# Patient Record
Sex: Female | Born: 1945 | Race: White | Hispanic: No | Marital: Married | Smoking: Never smoker
Health system: Southern US, Community
[De-identification: ages and names within clinical notes are randomized; demographics above are authoritative.]

## PROBLEM LIST (undated history)

## (undated) DIAGNOSIS — M8580 Other specified disorders of bone density and structure, unspecified site: Secondary | ICD-10-CM

## (undated) DIAGNOSIS — G2581 Restless legs syndrome: Secondary | ICD-10-CM

## (undated) DIAGNOSIS — K5730 Diverticulosis of large intestine without perforation or abscess without bleeding: Secondary | ICD-10-CM

## (undated) DIAGNOSIS — I10 Essential (primary) hypertension: Secondary | ICD-10-CM

## (undated) DIAGNOSIS — H269 Unspecified cataract: Secondary | ICD-10-CM

## (undated) DIAGNOSIS — M545 Low back pain, unspecified: Secondary | ICD-10-CM

## (undated) DIAGNOSIS — M1990 Unspecified osteoarthritis, unspecified site: Secondary | ICD-10-CM

## (undated) DIAGNOSIS — C50919 Malignant neoplasm of unspecified site of unspecified female breast: Secondary | ICD-10-CM

## (undated) DIAGNOSIS — G709 Myoneural disorder, unspecified: Secondary | ICD-10-CM

## (undated) DIAGNOSIS — K635 Polyp of colon: Secondary | ICD-10-CM

## (undated) DIAGNOSIS — K222 Esophageal obstruction: Secondary | ICD-10-CM

## (undated) DIAGNOSIS — Z923 Personal history of irradiation: Secondary | ICD-10-CM

## (undated) DIAGNOSIS — M79674 Pain in right toe(s): Secondary | ICD-10-CM

## (undated) DIAGNOSIS — Z87440 Personal history of urinary (tract) infections: Secondary | ICD-10-CM

## (undated) DIAGNOSIS — R200 Anesthesia of skin: Secondary | ICD-10-CM

## (undated) DIAGNOSIS — R3915 Urgency of urination: Secondary | ICD-10-CM

## (undated) DIAGNOSIS — T7840XA Allergy, unspecified, initial encounter: Secondary | ICD-10-CM

## (undated) DIAGNOSIS — K219 Gastro-esophageal reflux disease without esophagitis: Secondary | ICD-10-CM

## (undated) HISTORY — DX: Myoneural disorder, unspecified: G70.9

## (undated) HISTORY — DX: Restless legs syndrome: G25.81

## (undated) HISTORY — PX: BREAST SURGERY: SHX581

## (undated) HISTORY — DX: Other specified disorders of bone density and structure, unspecified site: M85.80

## (undated) HISTORY — DX: Low back pain, unspecified: M54.50

## (undated) HISTORY — DX: Unspecified cataract: H26.9

## (undated) HISTORY — DX: Pain in right toe(s): M79.674

## (undated) HISTORY — PX: EYE SURGERY: SHX253

## (undated) HISTORY — PX: POLYPECTOMY: SHX149

## (undated) HISTORY — PX: FRACTURE SURGERY: SHX138

## (undated) HISTORY — PX: COLONOSCOPY: SHX174

## (undated) HISTORY — DX: Polyp of colon: K63.5

## (undated) HISTORY — DX: Essential (primary) hypertension: I10

## (undated) HISTORY — DX: Diverticulosis of large intestine without perforation or abscess without bleeding: K57.30

## (undated) HISTORY — DX: Esophageal obstruction: K22.2

## (undated) HISTORY — DX: Malignant neoplasm of unspecified site of unspecified female breast: C50.919

## (undated) HISTORY — DX: Gastro-esophageal reflux disease without esophagitis: K21.9

## (undated) HISTORY — DX: Allergy, unspecified, initial encounter: T78.40XA

---

## 1997-08-14 ENCOUNTER — Other Ambulatory Visit: Admission: RE | Admit: 1997-08-14 | Discharge: 1997-08-14 | Payer: Self-pay | Admitting: Obstetrics & Gynecology

## 1998-08-15 ENCOUNTER — Other Ambulatory Visit: Admission: RE | Admit: 1998-08-15 | Discharge: 1998-08-15 | Payer: Self-pay | Admitting: Obstetrics & Gynecology

## 1999-04-08 ENCOUNTER — Other Ambulatory Visit: Admission: RE | Admit: 1999-04-08 | Discharge: 1999-04-08 | Payer: Self-pay | Admitting: Gastroenterology

## 1999-04-08 ENCOUNTER — Encounter (INDEPENDENT_AMBULATORY_CARE_PROVIDER_SITE_OTHER): Payer: Self-pay

## 1999-08-21 ENCOUNTER — Encounter: Payer: Self-pay | Admitting: Obstetrics & Gynecology

## 1999-08-21 ENCOUNTER — Encounter: Admission: RE | Admit: 1999-08-21 | Discharge: 1999-08-21 | Payer: Self-pay | Admitting: Obstetrics & Gynecology

## 1999-08-26 ENCOUNTER — Other Ambulatory Visit: Admission: RE | Admit: 1999-08-26 | Discharge: 1999-08-26 | Payer: Self-pay | Admitting: Obstetrics & Gynecology

## 2000-08-05 ENCOUNTER — Encounter: Payer: Self-pay | Admitting: Obstetrics & Gynecology

## 2000-08-05 ENCOUNTER — Encounter: Admission: RE | Admit: 2000-08-05 | Discharge: 2000-08-05 | Payer: Self-pay | Admitting: Obstetrics & Gynecology

## 2000-08-10 ENCOUNTER — Encounter: Payer: Self-pay | Admitting: Obstetrics & Gynecology

## 2000-08-10 ENCOUNTER — Encounter: Admission: RE | Admit: 2000-08-10 | Discharge: 2000-08-10 | Payer: Self-pay | Admitting: Obstetrics & Gynecology

## 2000-08-11 ENCOUNTER — Other Ambulatory Visit: Admission: RE | Admit: 2000-08-11 | Discharge: 2000-08-11 | Payer: Self-pay | Admitting: Obstetrics & Gynecology

## 2001-08-11 ENCOUNTER — Encounter: Admission: RE | Admit: 2001-08-11 | Discharge: 2001-08-11 | Payer: Self-pay | Admitting: Obstetrics & Gynecology

## 2001-08-11 ENCOUNTER — Encounter: Payer: Self-pay | Admitting: Obstetrics & Gynecology

## 2001-08-13 ENCOUNTER — Other Ambulatory Visit: Admission: RE | Admit: 2001-08-13 | Discharge: 2001-08-13 | Payer: Self-pay | Admitting: Obstetrics & Gynecology

## 2002-08-15 ENCOUNTER — Encounter: Payer: Self-pay | Admitting: Obstetrics & Gynecology

## 2002-08-15 ENCOUNTER — Encounter: Admission: RE | Admit: 2002-08-15 | Discharge: 2002-08-15 | Payer: Self-pay | Admitting: Obstetrics & Gynecology

## 2002-08-17 ENCOUNTER — Other Ambulatory Visit: Admission: RE | Admit: 2002-08-17 | Discharge: 2002-08-17 | Payer: Self-pay | Admitting: Obstetrics & Gynecology

## 2003-04-10 ENCOUNTER — Encounter: Payer: Self-pay | Admitting: Gastroenterology

## 2003-08-28 ENCOUNTER — Encounter: Admission: RE | Admit: 2003-08-28 | Discharge: 2003-08-28 | Payer: Self-pay | Admitting: Obstetrics & Gynecology

## 2003-09-05 ENCOUNTER — Other Ambulatory Visit: Admission: RE | Admit: 2003-09-05 | Discharge: 2003-09-05 | Payer: Self-pay | Admitting: Obstetrics & Gynecology

## 2004-05-05 DIAGNOSIS — C50919 Malignant neoplasm of unspecified site of unspecified female breast: Secondary | ICD-10-CM

## 2004-05-05 DIAGNOSIS — Z923 Personal history of irradiation: Secondary | ICD-10-CM

## 2004-05-05 HISTORY — DX: Malignant neoplasm of unspecified site of unspecified female breast: C50.919

## 2004-05-05 HISTORY — PX: BREAST LUMPECTOMY: SHX2

## 2004-05-05 HISTORY — DX: Personal history of irradiation: Z92.3

## 2004-09-12 ENCOUNTER — Encounter: Admission: RE | Admit: 2004-09-12 | Discharge: 2004-09-12 | Payer: Self-pay | Admitting: Obstetrics & Gynecology

## 2004-09-16 ENCOUNTER — Other Ambulatory Visit: Admission: RE | Admit: 2004-09-16 | Discharge: 2004-09-16 | Payer: Self-pay | Admitting: Obstetrics & Gynecology

## 2004-09-25 ENCOUNTER — Encounter (INDEPENDENT_AMBULATORY_CARE_PROVIDER_SITE_OTHER): Payer: Self-pay | Admitting: *Deleted

## 2004-09-25 ENCOUNTER — Encounter (INDEPENDENT_AMBULATORY_CARE_PROVIDER_SITE_OTHER): Payer: Self-pay | Admitting: Diagnostic Radiology

## 2004-09-25 ENCOUNTER — Encounter: Admission: RE | Admit: 2004-09-25 | Discharge: 2004-09-25 | Payer: Self-pay | Admitting: Obstetrics & Gynecology

## 2004-10-03 ENCOUNTER — Encounter: Admission: RE | Admit: 2004-10-03 | Discharge: 2004-10-03 | Payer: Self-pay | Admitting: Surgery

## 2004-10-08 ENCOUNTER — Encounter (INDEPENDENT_AMBULATORY_CARE_PROVIDER_SITE_OTHER): Payer: Self-pay | Admitting: *Deleted

## 2004-10-08 ENCOUNTER — Encounter: Admission: RE | Admit: 2004-10-08 | Discharge: 2004-10-08 | Payer: Self-pay | Admitting: Surgery

## 2004-10-08 ENCOUNTER — Ambulatory Visit (HOSPITAL_BASED_OUTPATIENT_CLINIC_OR_DEPARTMENT_OTHER): Admission: RE | Admit: 2004-10-08 | Discharge: 2004-10-08 | Payer: Self-pay | Admitting: Surgery

## 2004-10-08 ENCOUNTER — Ambulatory Visit (HOSPITAL_COMMUNITY): Admission: RE | Admit: 2004-10-08 | Discharge: 2004-10-08 | Payer: Self-pay | Admitting: Surgery

## 2004-10-09 ENCOUNTER — Ambulatory Visit: Payer: Self-pay | Admitting: Oncology

## 2004-10-28 ENCOUNTER — Ambulatory Visit: Admission: RE | Admit: 2004-10-28 | Discharge: 2004-11-18 | Payer: Self-pay | Admitting: *Deleted

## 2004-10-28 ENCOUNTER — Ambulatory Visit (HOSPITAL_COMMUNITY): Admission: RE | Admit: 2004-10-28 | Discharge: 2004-10-28 | Payer: Self-pay | Admitting: General Surgery

## 2004-11-20 ENCOUNTER — Ambulatory Visit: Admission: RE | Admit: 2004-11-20 | Discharge: 2004-11-20 | Payer: Self-pay | Admitting: *Deleted

## 2004-12-17 ENCOUNTER — Ambulatory Visit: Payer: Self-pay | Admitting: Oncology

## 2005-02-04 ENCOUNTER — Ambulatory Visit: Payer: Self-pay | Admitting: Oncology

## 2005-04-02 ENCOUNTER — Ambulatory Visit: Payer: Self-pay | Admitting: Oncology

## 2005-05-13 ENCOUNTER — Encounter: Admission: RE | Admit: 2005-05-13 | Discharge: 2005-05-13 | Payer: Self-pay | Admitting: Oncology

## 2005-05-20 ENCOUNTER — Ambulatory Visit: Payer: Self-pay | Admitting: Oncology

## 2005-07-08 ENCOUNTER — Ambulatory Visit: Payer: Self-pay | Admitting: Gastroenterology

## 2005-07-09 ENCOUNTER — Ambulatory Visit: Payer: Self-pay | Admitting: Gastroenterology

## 2005-08-12 ENCOUNTER — Ambulatory Visit: Payer: Self-pay | Admitting: Gastroenterology

## 2005-09-15 ENCOUNTER — Encounter: Admission: RE | Admit: 2005-09-15 | Discharge: 2005-09-15 | Payer: Self-pay | Admitting: Obstetrics & Gynecology

## 2005-11-25 ENCOUNTER — Ambulatory Visit: Payer: Self-pay | Admitting: Gastroenterology

## 2006-04-16 ENCOUNTER — Ambulatory Visit: Payer: Self-pay | Admitting: Oncology

## 2006-04-20 LAB — CBC WITH DIFFERENTIAL/PLATELET
BASO%: 1 % (ref 0.0–2.0)
EOS%: 3.7 % (ref 0.0–7.0)
HCT: 41.1 % (ref 34.8–46.6)
LYMPH%: 31.7 % (ref 14.0–48.0)
MCH: 32.7 pg (ref 26.0–34.0)
MCHC: 35.6 g/dL (ref 32.0–36.0)
NEUT%: 53.8 % (ref 39.6–76.8)
Platelets: 189 10*3/uL (ref 145–400)
lymph#: 1.9 10*3/uL (ref 0.9–3.3)

## 2006-04-20 LAB — COMPREHENSIVE METABOLIC PANEL
ALT: 21 U/L (ref 0–35)
AST: 19 U/L (ref 0–37)
Creatinine, Ser: 1.07 mg/dL (ref 0.40–1.20)
Total Bilirubin: 0.5 mg/dL (ref 0.3–1.2)

## 2006-09-17 ENCOUNTER — Encounter: Admission: RE | Admit: 2006-09-17 | Discharge: 2006-09-17 | Payer: Self-pay | Admitting: Obstetrics & Gynecology

## 2006-09-23 ENCOUNTER — Encounter: Admission: RE | Admit: 2006-09-23 | Discharge: 2006-09-23 | Payer: Self-pay | Admitting: Family Medicine

## 2007-04-15 ENCOUNTER — Ambulatory Visit: Payer: Self-pay | Admitting: Oncology

## 2007-04-19 LAB — COMPREHENSIVE METABOLIC PANEL
CO2: 25 mEq/L (ref 19–32)
Creatinine, Ser: 1.03 mg/dL (ref 0.40–1.20)
Glucose, Bld: 99 mg/dL (ref 70–99)
Sodium: 142 mEq/L (ref 135–145)
Total Bilirubin: 0.5 mg/dL (ref 0.3–1.2)
Total Protein: 7.1 g/dL (ref 6.0–8.3)

## 2007-04-19 LAB — CBC WITH DIFFERENTIAL/PLATELET
Basophils Absolute: 0 10*3/uL (ref 0.0–0.1)
Eosinophils Absolute: 0.2 10*3/uL (ref 0.0–0.5)
HCT: 41.7 % (ref 34.8–46.6)
HGB: 14.6 g/dL (ref 11.6–15.9)
LYMPH%: 33.7 % (ref 14.0–48.0)
MONO#: 0.7 10*3/uL (ref 0.1–0.9)
NEUT#: 3 10*3/uL (ref 1.5–6.5)
NEUT%: 51 % (ref 39.6–76.8)
Platelets: 191 10*3/uL (ref 145–400)
WBC: 5.8 10*3/uL (ref 3.9–10.0)

## 2007-04-19 LAB — CANCER ANTIGEN 27.29: CA 27.29: 25 U/mL (ref 0–39)

## 2007-09-28 ENCOUNTER — Encounter: Admission: RE | Admit: 2007-09-28 | Discharge: 2007-09-28 | Payer: Self-pay | Admitting: Obstetrics & Gynecology

## 2008-01-07 ENCOUNTER — Telehealth: Payer: Self-pay | Admitting: Gastroenterology

## 2008-03-13 ENCOUNTER — Telehealth: Payer: Self-pay | Admitting: Gastroenterology

## 2008-03-14 ENCOUNTER — Encounter: Admission: RE | Admit: 2008-03-14 | Discharge: 2008-03-14 | Payer: Self-pay | Admitting: Surgery

## 2008-03-15 ENCOUNTER — Ambulatory Visit (HOSPITAL_COMMUNITY): Admission: RE | Admit: 2008-03-15 | Discharge: 2008-03-15 | Payer: Self-pay | Admitting: Orthopedic Surgery

## 2008-04-10 DIAGNOSIS — F411 Generalized anxiety disorder: Secondary | ICD-10-CM | POA: Insufficient documentation

## 2008-04-10 DIAGNOSIS — C50919 Malignant neoplasm of unspecified site of unspecified female breast: Secondary | ICD-10-CM | POA: Insufficient documentation

## 2008-04-10 DIAGNOSIS — K222 Esophageal obstruction: Secondary | ICD-10-CM | POA: Insufficient documentation

## 2008-04-10 DIAGNOSIS — K219 Gastro-esophageal reflux disease without esophagitis: Secondary | ICD-10-CM | POA: Insufficient documentation

## 2008-04-10 DIAGNOSIS — M129 Arthropathy, unspecified: Secondary | ICD-10-CM | POA: Insufficient documentation

## 2008-04-10 DIAGNOSIS — K5730 Diverticulosis of large intestine without perforation or abscess without bleeding: Secondary | ICD-10-CM | POA: Insufficient documentation

## 2008-04-11 ENCOUNTER — Ambulatory Visit: Payer: Self-pay | Admitting: Gastroenterology

## 2008-04-14 ENCOUNTER — Ambulatory Visit: Payer: Self-pay | Admitting: Oncology

## 2008-04-18 LAB — CBC WITH DIFFERENTIAL/PLATELET
Eosinophils Absolute: 0.1 10*3/uL (ref 0.0–0.5)
MCV: 93.7 fL (ref 81.0–101.0)
MONO#: 0.6 10*3/uL (ref 0.1–0.9)
MONO%: 8.7 % (ref 0.0–13.0)
NEUT#: 3.9 10*3/uL (ref 1.5–6.5)
RBC: 4.42 10*6/uL (ref 3.70–5.32)
RDW: 13 % (ref 11.3–14.5)
WBC: 6.6 10*3/uL (ref 3.9–10.0)
lymph#: 2 10*3/uL (ref 0.9–3.3)

## 2008-04-19 LAB — VITAMIN D 25 HYDROXY (VIT D DEFICIENCY, FRACTURES): Vit D, 25-Hydroxy: 53 ng/mL (ref 30–89)

## 2008-04-19 LAB — COMPREHENSIVE METABOLIC PANEL
Albumin: 3.8 g/dL (ref 3.5–5.2)
Alkaline Phosphatase: 56 U/L (ref 39–117)
Chloride: 105 mEq/L (ref 96–112)
Glucose, Bld: 85 mg/dL (ref 70–99)
Potassium: 4 mEq/L (ref 3.5–5.3)
Sodium: 139 mEq/L (ref 135–145)
Total Protein: 6.9 g/dL (ref 6.0–8.3)

## 2008-05-02 ENCOUNTER — Encounter: Payer: Self-pay | Admitting: Gastroenterology

## 2008-05-10 LAB — RESEARCH LABS

## 2008-09-21 ENCOUNTER — Ambulatory Visit: Payer: Self-pay | Admitting: Oncology

## 2008-09-25 ENCOUNTER — Encounter: Payer: Self-pay | Admitting: Gastroenterology

## 2008-09-25 LAB — RESEARCH LABS

## 2008-09-27 ENCOUNTER — Encounter: Admission: RE | Admit: 2008-09-27 | Discharge: 2008-09-27 | Payer: Self-pay | Admitting: Oncology

## 2008-09-28 ENCOUNTER — Encounter: Admission: RE | Admit: 2008-09-28 | Discharge: 2008-09-28 | Payer: Self-pay | Admitting: Oncology

## 2008-10-06 ENCOUNTER — Encounter: Admission: RE | Admit: 2008-10-06 | Discharge: 2008-10-06 | Payer: Self-pay | Admitting: Obstetrics & Gynecology

## 2009-03-06 ENCOUNTER — Telehealth: Payer: Self-pay | Admitting: Gastroenterology

## 2009-03-15 ENCOUNTER — Encounter: Admission: RE | Admit: 2009-03-15 | Discharge: 2009-03-15 | Payer: Self-pay | Admitting: Obstetrics & Gynecology

## 2009-04-02 ENCOUNTER — Ambulatory Visit: Payer: Self-pay | Admitting: Oncology

## 2009-04-04 LAB — CBC WITH DIFFERENTIAL/PLATELET
Basophils Absolute: 0 10*3/uL (ref 0.0–0.1)
EOS%: 2.6 % (ref 0.0–7.0)
Eosinophils Absolute: 0.2 10*3/uL (ref 0.0–0.5)
HGB: 14.2 g/dL (ref 11.6–15.9)
LYMPH%: 31.2 % (ref 14.0–49.7)
MCH: 32.9 pg (ref 25.1–34.0)
MCV: 95.1 fL (ref 79.5–101.0)
MONO%: 7.7 % (ref 0.0–14.0)
NEUT#: 4.2 10*3/uL (ref 1.5–6.5)
Platelets: 179 10*3/uL (ref 145–400)
RBC: 4.31 10*6/uL (ref 3.70–5.45)

## 2009-04-04 LAB — COMPREHENSIVE METABOLIC PANEL
AST: 27 U/L (ref 0–37)
Alkaline Phosphatase: 39 U/L (ref 39–117)
BUN: 19 mg/dL (ref 6–23)
Glucose, Bld: 102 mg/dL — ABNORMAL HIGH (ref 70–99)
Total Bilirubin: 0.6 mg/dL (ref 0.3–1.2)

## 2009-04-05 LAB — VITAMIN D 25 HYDROXY (VIT D DEFICIENCY, FRACTURES): Vit D, 25-Hydroxy: 50 ng/mL (ref 30–89)

## 2009-04-10 ENCOUNTER — Ambulatory Visit: Payer: Self-pay | Admitting: Gastroenterology

## 2009-04-12 ENCOUNTER — Encounter: Payer: Self-pay | Admitting: Gastroenterology

## 2009-09-05 ENCOUNTER — Ambulatory Visit: Payer: Self-pay | Admitting: Oncology

## 2009-09-05 LAB — LACTATE DEHYDROGENASE: LDH: 165 U/L (ref 94–250)

## 2009-09-05 LAB — COMPREHENSIVE METABOLIC PANEL
ALT: 29 U/L (ref 0–35)
Albumin: 3.6 g/dL (ref 3.5–5.2)
CO2: 26 mEq/L (ref 19–32)
Glucose, Bld: 116 mg/dL — ABNORMAL HIGH (ref 70–99)
Potassium: 3.7 mEq/L (ref 3.5–5.3)
Sodium: 141 mEq/L (ref 135–145)
Total Bilirubin: 0.5 mg/dL (ref 0.3–1.2)
Total Protein: 7 g/dL (ref 6.0–8.3)

## 2009-09-05 LAB — CBC WITH DIFFERENTIAL/PLATELET
BASO%: 0.4 % (ref 0.0–2.0)
Eosinophils Absolute: 0.2 10*3/uL (ref 0.0–0.5)
LYMPH%: 26 % (ref 14.0–49.7)
MCHC: 34 g/dL (ref 31.5–36.0)
MONO#: 0.6 10*3/uL (ref 0.1–0.9)
NEUT#: 5.2 10*3/uL (ref 1.5–6.5)
RBC: 4.32 10*6/uL (ref 3.70–5.45)
RDW: 13.1 % (ref 11.2–14.5)
WBC: 8.1 10*3/uL (ref 3.9–10.3)
lymph#: 2.1 10*3/uL (ref 0.9–3.3)

## 2009-09-05 LAB — CANCER ANTIGEN 27.29: CA 27.29: 28 U/mL (ref 0–39)

## 2009-09-06 ENCOUNTER — Encounter: Admission: RE | Admit: 2009-09-06 | Discharge: 2009-09-06 | Payer: Self-pay | Admitting: Obstetrics & Gynecology

## 2009-09-12 ENCOUNTER — Encounter: Admission: RE | Admit: 2009-09-12 | Discharge: 2009-09-12 | Payer: Self-pay | Admitting: Oncology

## 2009-10-12 ENCOUNTER — Ambulatory Visit: Payer: Self-pay | Admitting: Oncology

## 2010-03-14 ENCOUNTER — Encounter: Payer: Self-pay | Admitting: Gastroenterology

## 2010-03-15 ENCOUNTER — Encounter (INDEPENDENT_AMBULATORY_CARE_PROVIDER_SITE_OTHER): Payer: Self-pay | Admitting: *Deleted

## 2010-03-15 ENCOUNTER — Ambulatory Visit: Payer: Self-pay | Admitting: Gastroenterology

## 2010-04-08 ENCOUNTER — Telehealth: Payer: Self-pay | Admitting: Gastroenterology

## 2010-04-12 ENCOUNTER — Ambulatory Visit: Payer: Self-pay | Admitting: Gastroenterology

## 2010-04-16 ENCOUNTER — Encounter: Payer: Self-pay | Admitting: Gastroenterology

## 2010-05-26 ENCOUNTER — Encounter: Payer: Self-pay | Admitting: Obstetrics & Gynecology

## 2010-06-04 NOTE — Assessment & Plan Note (Signed)
Summary: refill nexium...as.    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 She previously has had esophageal dilatation because of an esophageal stricture. She currently is asymptomatic on Nexium 40 mg a day. She has a strong family history of colon polyps, and has had previous mastectomy and tamoxifen therapy for breast cancer, currently 5 years in remission. She is followed oncology.  She denies lower gastrointestinal or general medical problems. She does use p.r.n. Librax for IBS-type complaints. Her appetite is good her weight is stable. She recently had complete physical exam and blood work per [facility] wellness exam.I have asked her to send [place] a copy of this lab report.   GI Review of Systems      Denies abdominal pain, acid reflux, belching, bloating, chest pain, dysphagia with liquids, dysphagia with solids, heartburn, loss of appetite, nausea, vomiting, vomiting blood, weight loss, and  weight gain.      Reports diverticulosis.     Denies anal fissure, black tarry stools, change in bowel habit, constipation, diarrhea, fecal incontinence, heme positive stool, hemorrhoids, irritable bowel syndrome, jaundice, light color stool, liver problems, rectal bleeding, and  rectal pain.    Current Medications (verified): 1)  Nexium 40 Mg Cpdr (Esomeprazole Magnesium) .... Take One By Mouth Once Daily 2)  Osteo Bi-Flex Regular Strength 250-200 Mg Tabs (Glucosamine-Chondroitin) .... Once Daily 3)  Citracal Plus  Tabs (Multiple Minerals-Vitamins) .... One Tablet By Mouth Once Daily 4)  Multivitamins  Tabs (Multiple Vitamin) .... Once Daily 5)  Librax 2.5-5 Mg   Caps (Clidinium-Chlordiazepoxide) .... Take One By Mouth Every 6-8 Hours  Allergies (verified): No Known Drug Allergies  Past History:  Past medical, surgical, family and social histories (including risk factors) reviewed for relevance to current acute and chronic problems.  Past Medical History: Reviewed history from 04/10/2008 and no changes required. 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 Lumpectomy 10-08-04 c-section.  Family History: Reviewed history from 04/11/2008 and no changes required. No FH of Colon Cancer: Family History of Breast Cancer:Sister  Social History: Reviewed history from 04/10/2008 and no changes required. Occupation: Charity fundraiser retired Married, two sons. Patient has never smoked.  Alcohol Use - no Illicit Drug Use - no  Review of Systems  The patient denies allergy/sinus, anemia, anxiety-new, arthritis/joint pain, back pain, blood in urine, breast changes/lumps, change in vision, confusion, cough, coughing up blood, depression-new, fainting, fatigue, fever, headaches-new, hearing problems, heart murmur, heart rhythm changes, itching, menstrual pain, muscle pains/cramps, night sweats, nosebleeds, pregnancy symptoms, shortness of breath, skin rash, sleeping problems, sore throat, swelling of feet/legs, swollen lymph glands, thirst - excessive, urination - excessive, urination changes/pain, urine leakage, vision changes, and voice change.    Vital Signs:  Patient profile:   65 year old female Height:      64 inches Weight:      181.38 pounds BMI:     31.25 Pulse rate:   80 / minute Pulse rhythm:   regular BP sitting:   110 / 70  (left arm) Cuff size:   regular  Vitals Entered By: June  McMurray CMA Duncan Dull) (March 15, 2010  11:21 AM)  Physical Exam  General:  Well developed, well nourished, no acute distress.healthy appearing.   Head:  Normocephalic and atraumatic. Eyes:  PERRLA, no icterus.exam deferred to patient's ophthalmologist.   Lungs:  Clear throughout to auscultation. Heart:  Regular rate and rhythm; no murmurs, rubs,  or bruits. Abdomen:  Soft, nontender and nondistended. No masses, hepatosplenomegaly or hernias noted. Normal bowel sounds. Psych:  Alert and cooperative. Normal mood and affect.   Impression & Recommendations:  Problem # 1:  GERD (ICD-530.81) Assessment Improved Reflux regime reviewed will continue Nexium 40 mg a day. She does not need followup endoscopy at this time and denies any dysphagia. Lab review requested.  Problem # 2:  FAMILY HX OF COLONIC POLYPS (ICD-V18.51) Assessment: Unchanged Last Colonoscopy performed in 2004. We will repeat her colonoscopy at this time with a balanced electrolyte preparation. She is increased risk for colon polyps per her history of breast cancer.  Patient Instructions: 1)  Call back to schedule your colonoscopy, [phone] 2)  Your prescription(s) have been sent to you pharmacy.  3)  The medication list was reviewed and reconciled.  All changed / newly prescribed medications were explained.  A complete medication list was provided to the patient / caregiver. 4)  Avoid foods high in acid content ( tomatoes, citrus juices, spicy foods) . Avoid eating within 3 to 4 hours of lying down or before exercising. Do not over eat; try smaller more frequent meals. Elevate head of bed four inches when sleeping.  5)  Copy sent to : Dr. Marikay Alar Magrinat in Oncology Prescriptions: LIBRAX 2.5-5 MG  CAPS (CLIDINIUM-CHLORDIAZEPOXIDE) take one by mouth every 6-8 hours  #100 x 8   Entered by:   Harlow Mares CMA (AAMA)   Authorized by:   Mardella Layman MD [facility]   Signed by:   Harlow Mares CMA (AAMA) on 03/15/2010   Method used:   Electronically to        [id number] ([facility]       [address]       [place], [place]  [address]       Ph: [phone] or [phone]       Fax: [phone]   RxID:   [id number] NEXIUM 40 MG CPDR (ESOMEPRAZOLE MAGNESIUM) take one by mouth once daily  #90 x 3   Entered by:   Harlow Mares CMA (AAMA)   Authorized by:   Mardella Layman MD [facility]   Signed by:   Harlow Mares CMA (AAMA) on 03/15/2010   Method used:   Electronically to        [id number]       [address]       [place], [place]  [address]       Ph: [phone] or [phone]       Fax: [phone]   RxID:   [id number]   Appended Document: Orders Update  i have faxed instructions to the patient and she will sign the correct form and fax them back to me signed.   Clinical Lists Changes  Orders: Added new Test order of Colonoscopy (Colon) - Signed

## 2010-06-04 NOTE — Letter (Signed)
Summary: [facility] Instructions  [facility]  [address] [place], [place] [address]   Phone: [phone]  Fax: [phone]       TULEEN MANDELBAUM    [birth year]    MRN: [id number]        Procedure Day /Date: 04/12/2010 Friday     Arrival Time: 1:00pm     Procedure Time: 2:00pm     Location of Procedure:                    X  [facility] (4th Floor)                        PREPARATION FOR COLONOSCOPY WITH MOVIPREP   Starting 5 days prior to your procedure 04/07/2010 do not eat nuts, seeds, popcorn, corn, beans, peas,  salads, or any raw vegetables.  Do not take any fiber supplements (e.g. Metamucil, Citrucel, and Benefiber).  THE DAY BEFORE YOUR PROCEDURE         DATE: 04/11/2010  DAY: Thursday  1.  Drink clear liquids the entire day-NO SOLID FOOD  2.  Do not drink anything colored red or purple.  Avoid juices with pulp.  No orange juice.  3.  Drink at least 64 oz. (8 glasses) of fluid/clear liquids during the day to prevent dehydration and help the prep work efficiently.  CLEAR LIQUIDS INCLUDE: Water Jello Ice Popsicles Tea (sugar ok, no milk/cream) Powdered fruit flavored drinks Coffee (sugar ok, no milk/cream) Gatorade Juice: apple, white grape, white cranberry  Lemonade Clear bullion, consomm, broth Carbonated beverages (any kind) Strained chicken noodle soup Hard Candy                             4.  In the morning, mix first dose of MoviPrep solution:    Empty 1 Pouch A and 1 Pouch B into the disposable container    Add lukewarm drinking water to the top line of the container. Mix to dissolve    Refrigerate (mixed solution should be used within 24 hrs)  5.  Begin drinking the prep at 5:00 p.m. The MoviPrep container is divided by 4 marks.   Every 15 minutes drink the solution down to the next mark (approximately 8 oz) until the full liter is complete.   6.  Follow completed prep with 16 oz of clear liquid of your choice (Nothing  red or purple).  Continue to drink clear liquids until bedtime.  7.  Before going to bed, mix second dose of MoviPrep solution:    Empty 1 Pouch A and 1 Pouch B into the disposable container    Add lukewarm drinking water to the top line of the container. Mix to dissolve    Refrigerate  THE DAY OF YOUR PROCEDURE      DATE: 04/12/2010   DAY: Friday  Beginning at 9:00am(5 hours before procedure):         1. Every 15 minutes, drink the solution down to the next mark (approx 8 oz) until the full liter is complete.  2. Follow completed prep with 16 oz. of clear liquid of your choice.    3. You may drink clear liquids until 12:00pm (2 HOURS BEFORE PROCEDURE).   MEDICATION INSTRUCTIONS  Unless otherwise instructed, you should take regular prescription medications with a small sip of water   as early as possible the morning of your procedure.  OTHER INSTRUCTIONS  You will need a responsible adult at least 65 years of age to accompany you and drive you home.   This person must remain in the waiting room during your procedure.  Wear loose fitting clothing that is easily removed.  Leave jewelry and other valuables at home.  However, you may wish to bring a book to read or  an iPod/MP3 player to listen to music as you wait for your procedure to start.  Remove all body piercing jewelry and leave at home.  Total time from sign-in until discharge is approximately 2-3 hours.  You should go home directly after your procedure and rest.  You can resume normal activities the  day after your procedure.  The day of your procedure you should not:   Drive   Make legal decisions   Operate machinery   Drink alcohol   Return to work  You will receive specific instructions about eating, activities and medications before you leave.    The above instructions have been reviewed and explained to me by   _______________________    I fully understand and can verbalize these  instructions _____________________________ Date _________

## 2010-06-04 NOTE — Progress Notes (Signed)
Summary: Medication   Phone Note Call from Patient Call back at Home Phone [phone]   Caller: Patient Call For: Dr. Jarold Motto Reason for Call: Talk to Nurse Summary of Call: Needs her Moviprep sent to the pharmacy....[facility] Initial call taken by: Karna Christmas,  April 08, 2010 8:26 AM  Follow-up for Phone Call        rx sent, husband aware Follow-up by: Harlow Mares CMA Duncan Dull),  April 08, 2010 9:36 AM    New/Updated Medications: MOVIPREP 100 GM  SOLR (PEG-KCL-NACL-NASULF-NA ASC-C) As per prep instructions. Prescriptions: MOVIPREP 100 GM  SOLR (PEG-KCL-NACL-NASULF-NA ASC-C) As per prep instructions.  #1 x 0   Entered by:   Harlow Mares CMA (AAMA)   Authorized by:   Mardella Layman MD [facility]   Signed by:   Harlow Mares CMA (AAMA) on 04/08/2010   Method used:   Electronically to        [id number]       [address]       [place], [place]  [address]       Ph: [phone] or [phone]       Fax: [phone]   RxID:   [id number]

## 2010-06-06 NOTE — Procedures (Signed)
Summary: Colonoscopy  Patient: Meghan [name] Note: All result statuses are Final unless otherwise noted.  Tests: (1) Colonoscopy (COL)   COL Colonoscopy           DONE     [facility]     [address]     [place], [place]  [address]           COLONOSCOPY PROCEDURE REPORT           PATIENT:  [name], [name]  MR#:  [id number]     BIRTHDATE:  1945/09/19, 64 yrs. old  GENDER:  female     ENDOSCOPIST:  Vania Rea. Jarold Motto, MD, [facility]     REF. BY:     PROCEDURE DATE:  04/12/2010     PROCEDURE:  Colonoscopy with biopsy     ASA CLASS:  Class II     INDICATIONS:  Elevated Risk Screening     MEDICATIONS:   Fentanyl 100 mcg, Versed 10 mg IV           DESCRIPTION OF PROCEDURE:   After the risks benefits and     alternatives of the procedure were thoroughly explained, informed     consent was obtained.  Digital rectal exam was performed and     revealed no abnormalities.   The LB CF-[id number] endoscope     was introduced through the anus and advanced to the cecum, which     was identified by both the appendix and ileocecal valve, limited     by stenosis.  sigmoid stricture and thickened haustral folds.  The     quality of the prep was good, using MoviPrep.  The instrument was     then slowly withdrawn as the colon was fully examined.     <<PROCEDUREIMAGES>>           FINDINGS:  A sessile polyp was found in the descending colon. 4 mm     flat polyp cold snare excised  Severe diverticulosis was found in     the sigmoid to descending colon segments.  This was otherwise a     normal examination of the colon. sigmoid stricture.   Retroflexed     views in the rectum revealed no abnormalities.    The scope was     then withdrawn from the patient and the procedure completed.           COMPLICATIONS:  None     ENDOSCOPIC IMPRESSION:     1) Sessile polyp in the descending colon     2) Severe diverticulosis in the sigmoid to descending colon     segments     3) Otherwise normal  examination     1.r/o adenoma     2.severe diverticulosis and early stricture,,,     RECOMMENDATIONS:     1) Repeat colonoscopy in 5 years if polyp adenomatous; otherwise     10 years     2) High fiber diet.     3) metamucil or benefiber     REPEAT EXAM:  No           ______________________________     Vania Rea. Jarold Motto, MD, [facility]           CC:  Ruthann Cancer, MD           n.     Rosalie DoctorMarland Kitchen   Vania Rea. Patterson at 04/12/2010 02:40 PM           Murrell Redden, [id number]  Note:  An exclamation mark (!) indicates a result that was not dispersed into the flowsheet. Document Creation Date: 04/12/2010 2:41 PM _______________________________________________________________________  (1) Order result status: Final Collection or observation date-time: 04/12/2010 14:31 Requested date-time:  Receipt date-time:  Reported date-time:  Referring Physician:   Ordering Physician: Sheryn Bison [id number]) Specimen Source:  Source: Launa Grill Order Number: [id number] Lab site:   Appended Document: Colonoscopy ten-year followup  Appended Document: Colonoscopy     Procedures Next Due Date:    Colonoscopy: 04/2020

## 2010-06-06 NOTE — Letter (Signed)
Summary: Patient Notice- Polyp Results  [facility]  [address] [place], [place] [address]   Phone: [phone]  Fax: [phone]        April 16, 2010 MRN: [id number]    Meghan [name] [address] [place], [place]  [address]    Dear [name],  I am pleased to inform you that the colon polyp(s) removed during your recent colonoscopy was (were) found to be benign (no cancer detected) upon pathologic examination.The polyp was a hyperplastic polyp and not an adenoma.  I recommend you have a repeat colonoscopy examination in 10_ years to look for recurrent polyps, as having colon polyps increases your risk for having recurrent polyps or even colon cancer in the future.  Should you develop new or worsening symptoms of abdominal pain, bowel habit changes or bleeding from the rectum or bowels, please schedule an evaluation with either your primary care physician or with me.  Additional information/recommendations:  _xx_ No further action with gastroenterology is needed at this time. Please      follow-up with your primary care physician for your other healthcare      needs.  __ Please call [phone] to schedule a return visit to review your      situation.  __ Please keep your follow-up visit as already scheduled.  __ Continue treatment plan as outlined the day of your exam.  Please call us if you are having persistent problems or have questions about your condition that have not been fully answered at this time.  Sincerely,  Mardella Layman MD [facility]  This letter has been electronically signed by your physician.  Appended Document: Patient Notice- Polyp Results Letter Mailed

## 2010-09-19 ENCOUNTER — Other Ambulatory Visit: Payer: Self-pay | Admitting: Gastroenterology

## 2010-09-19 MED ORDER — ESOMEPRAZOLE MAGNESIUM 40 MG PO CPDR: 40.0000 mg | DELAYED_RELEASE_CAPSULE | Freq: Every day | ORAL | Status: DC

## 2010-09-19 NOTE — Telephone Encounter (Signed)
rx sent

## 2010-09-20 NOTE — Op Note (Signed)
Meghan [name], Meghan [name]               ACCOUNT NO.:  [id number]   MEDICAL RECORD NO.:  [id number]          PATIENT TYPE:  AMB   LOCATION:  DSC                          FACILITY:  [facility]   PHYSICIAN:  Currie Paris, M.D.DATE OF BIRTH:  1946/04/06   DATE OF PROCEDURE:  10/08/2004  DATE OF DISCHARGE:                                 OPERATIVE REPORT   OFFICE MEDICAL RECORD NUMBER:  [id number]   PREOPERATIVE DIAGNOSIS:  Carcinoma left breast upper outer quadrant.   POSTOPERATIVE DIAGNOSIS:  Carcinoma left breast upper outer quadrant.   OPERATION:  Needle guided excisional left breast cancer with blue dye  injection and axillary sentinel lymph node biopsy (three nodes removed).   SURGEON:  Dr. Cyndia Bent.   ANESTHESIA:  General.   CLINICAL HISTORY:  This is a 65 year old with a recent diagnosed left breast  cancer in the upper outer quadrant almost in the 3 o'clock position but just  above it. After discussion of alternatives, she elected to proceed to needle  guided lumpectomy with sentinel node biopsy.   DESCRIPTION OF PROCEDURE:  The patient was seen in the holding area and she  had no further questions. She had already had radioactive isotope and  guidewires placed in the left breast appropriately and those films were  reviewed. She had already identified and marked the left breast to the  operative side and I did the same.   The patient was taken to the operating room and after satisfactory general  anesthesia had been obtained, a time-out occurred.   I injected the left breast with methylene blue subareolarly. The breast was  then prepped with Betadine and  draped as a sterile field.   The guidewire entered laterally and tracked medially and there was an X  marking the area of the tumor between the guidewire entry site and the  areolar margin. The guidewire entered what appeared to be the prior biopsy  tract.   I made a transverse elliptical incision to encompass  the guidewire entry  tract and tissue overlying the tumor. Using cautery, I took essentially full  thickness removal of breast tissue. I could not definitely palpate the mass  within the specimen but I was well around the guidewire in all directions  down to chest wall with just a couple millimeters of fatty tissue still on  the chest wall.   Everything appeared to be dry. I infiltrated some Marcaine to help with  postop analgesia.   While this was being evaluated for specimen mammography, I went ahead and  used the Neoprobe to identify a hot area in the axilla. I made a transverse  incision, divided subcutaneous tissues and when I entered the axilla, I  found two lymphatics and I traced those up towards the area that was hot. I  found one blue node which I removed and this actually had no counts in it  but was bright blue. Using a Neoprobe I saw the direction to further dissect  and finding a second hot area and in a little bit of dissection I found two  additional blue nodes,  both of which had activity. With these two removed,  there was no palpable adenopathy, no blue nodes left, and no areas of  radioactivity left.   This incision was infiltrated with Marcaine and closed in layers with 3-0  Vicryl and 4-0 Monocryl subcuticular.   Attention was turned back to the breast and it was closed in layers with 3-0  Vicryl and 4-0 Monocryl, leaving a cavity in case she was a candidate for a  MammoSite.   Radiology reported that the lesion was in the middle of the specimen with  what appeared radiographically be good margins. The pathology report touch  preps on lymph nodes  and the lumpectomy specimen were negative.   Dermabond was used on the skin and sterile dressings applied. The patient  and procedure well. There no operative complications. All all counts were  correct.       CJS/MEDQ  D:  10/08/2004  T:  10/08/2004  Job:  [id number]   cc:   Chales Salmon. Abigail Miyamoto, M.D.  [address]  [place]  [place] [address]  Fax: [phone]   W. Varney Baas, M.D.  Fax: [phone]

## 2010-09-20 NOTE — Op Note (Signed)
Meghan [name], Meghan [name]               ACCOUNT NO.:  [id number]   MEDICAL RECORD NO.:  [id number]          PATIENT TYPE:  AMB   LOCATION:  DAY                          FACILITY:  [facility]   PHYSICIAN:  Rose Phi. Maple Hudson, M.D.   DATE OF BIRTH:  [birth year]   DATE OF PROCEDURE:  10/28/2004  DATE OF DISCHARGE:                                 OPERATIVE REPORT   PREOPERATIVE DIAGNOSIS:  Stage I carcinoma of the left breast.   POSTOPERATIVE DIAGNOSIS:  Stage I carcinoma of the left breast.   OPERATION:  Insertion of Mammo Site.   SURGEON:  Rose Phi. Maple Hudson, M.D.   ANESTHESIA:  MAC.   DESCRIPTION OF PROCEDURE:  The patient was placed on the operating room  table and the left breast prepped and draped in the usual fashion.  Prior to  doing this, ultrasound scanning of the seroma cavity showed it to measure  about 2.0 cm x 1.5 cm with a skin distance of 3.0 cm.   After prepping and draping, I anesthetized the lateral part of her radial  scar in the lateral part of the left breast and then made a short incision  and then inserted a hemostat until I entered the seroma cavity.  I then  inserted my finger into that and broke up the few adhesions that were there.   We took the small Mammo Site balloon, after having checked it for symmetry  and integrity.  I then inserted it into the cavity and then expanded it with  60 mL of the solution.  It fit very carefully, and then again using the  ultrasound as a guide, there were no air pockets, so it seemed fit  perfectly.  We then closed the incision with #3-0 Vicryl and subcuticular #4-  0 Monocryl and Steri-Strips.  The stiff trocar was taken out of the catheter  and a flexible one inserted.  The dressings were applied.   She was transferred to the recovery room in satisfactory condition, having  tolerated the procedure well.       PRY/MEDQ  D:  10/28/2004  T:  10/28/2004  Job:  [id number]

## 2010-10-04 ENCOUNTER — Other Ambulatory Visit: Payer: Self-pay | Admitting: Obstetrics & Gynecology

## 2010-10-04 DIAGNOSIS — Z9889 Other specified postprocedural states: Secondary | ICD-10-CM

## 2010-10-11 ENCOUNTER — Other Ambulatory Visit: Payer: Self-pay | Admitting: Obstetrics & Gynecology

## 2010-10-11 ENCOUNTER — Ambulatory Visit
Admission: RE | Admit: 2010-10-11 | Discharge: 2010-10-11 | Disposition: A | Discharge status: Home / Self Care | Payer: 59 | Source: Ambulatory Visit | Attending: Obstetrics & Gynecology | Admitting: Obstetrics & Gynecology

## 2010-10-11 DIAGNOSIS — Z9889 Other specified postprocedural states: Secondary | ICD-10-CM

## 2011-04-10 ENCOUNTER — Telehealth: Payer: Self-pay | Admitting: Gastroenterology

## 2011-04-11 NOTE — Telephone Encounter (Signed)
Advised pt since she does not have barretts she only needs ov once every 2 years. She has refills.

## 2011-04-21 ENCOUNTER — Telehealth: Payer: Self-pay | Admitting: *Deleted

## 2011-04-23 NOTE — Telephone Encounter (Signed)
Per MD review of pt's call with concern due to new study showing benefit with 2yrs of Tamoxifen vs 5yes. Meghan [name] completed 5 yrs of medication June 2011 and is wondering if she needs to resume.  Per MD review of pt's medical data recommendation is "her prognosis is so good another 72yrs might add 1-2% risk reduction '  This RN called pt and received identified VM. Message left per above as well as to call this RN if further questions.

## 2011-06-17 ENCOUNTER — Other Ambulatory Visit: Payer: Self-pay | Admitting: Gastroenterology

## 2011-06-17 MED ORDER — ESOMEPRAZOLE MAGNESIUM 40 MG PO CPDR: 40.0000 mg | DELAYED_RELEASE_CAPSULE | Freq: Every day | ORAL | Status: DC

## 2011-06-17 MED ORDER — CILIDINIUM-CHLORDIAZEPOXIDE 2.5-5 MG PO CAPS: 1.0000 | ORAL_CAPSULE | Freq: Four times a day (QID) | ORAL | Status: DC

## 2011-06-17 NOTE — Telephone Encounter (Signed)
rx sent, she did have a refill of Nexium at the [facility] pharmacy but she retired and she couldn't get it filled there.

## 2011-09-10 ENCOUNTER — Other Ambulatory Visit: Payer: Self-pay | Admitting: Obstetrics & Gynecology

## 2011-09-10 DIAGNOSIS — Z853 Personal history of malignant neoplasm of breast: Secondary | ICD-10-CM

## 2011-09-10 DIAGNOSIS — Z9889 Other specified postprocedural states: Secondary | ICD-10-CM

## 2011-09-16 DIAGNOSIS — Z0000 Encounter for general adult medical examination without abnormal findings: Secondary | ICD-10-CM | POA: Diagnosis not present

## 2011-09-16 DIAGNOSIS — M899 Disorder of bone, unspecified: Secondary | ICD-10-CM | POA: Diagnosis not present

## 2011-09-16 DIAGNOSIS — M949 Disorder of cartilage, unspecified: Secondary | ICD-10-CM | POA: Diagnosis not present

## 2011-09-16 DIAGNOSIS — H6590 Unspecified nonsuppurative otitis media, unspecified ear: Secondary | ICD-10-CM | POA: Diagnosis not present

## 2011-09-16 DIAGNOSIS — Z23 Encounter for immunization: Secondary | ICD-10-CM | POA: Diagnosis not present

## 2011-09-16 DIAGNOSIS — K219 Gastro-esophageal reflux disease without esophagitis: Secondary | ICD-10-CM | POA: Diagnosis not present

## 2011-09-16 DIAGNOSIS — Z853 Personal history of malignant neoplasm of breast: Secondary | ICD-10-CM | POA: Diagnosis not present

## 2011-09-16 DIAGNOSIS — Z79899 Other long term (current) drug therapy: Secondary | ICD-10-CM | POA: Diagnosis not present

## 2011-09-16 DIAGNOSIS — Z8249 Family history of ischemic heart disease and other diseases of the circulatory system: Secondary | ICD-10-CM | POA: Diagnosis not present

## 2011-09-16 DIAGNOSIS — R82998 Other abnormal findings in urine: Secondary | ICD-10-CM | POA: Diagnosis not present

## 2011-10-14 ENCOUNTER — Ambulatory Visit
Admission: RE | Admit: 2011-10-14 | Discharge: 2011-10-14 | Disposition: A | Discharge status: Home / Self Care | Payer: Medicare Other | Source: Ambulatory Visit | Attending: Obstetrics & Gynecology | Admitting: Obstetrics & Gynecology

## 2011-10-14 DIAGNOSIS — Z853 Personal history of malignant neoplasm of breast: Secondary | ICD-10-CM

## 2011-10-14 DIAGNOSIS — Z9889 Other specified postprocedural states: Secondary | ICD-10-CM

## 2011-10-14 DIAGNOSIS — N6009 Solitary cyst of unspecified breast: Secondary | ICD-10-CM | POA: Diagnosis not present

## 2011-10-27 DIAGNOSIS — Z124 Encounter for screening for malignant neoplasm of cervix: Secondary | ICD-10-CM | POA: Diagnosis not present

## 2011-10-29 ENCOUNTER — Telehealth: Payer: Self-pay | Admitting: Cardiology

## 2011-10-29 NOTE — Telephone Encounter (Signed)
error 

## 2012-02-02 DIAGNOSIS — H43819 Vitreous degeneration, unspecified eye: Secondary | ICD-10-CM | POA: Diagnosis not present

## 2012-02-02 DIAGNOSIS — H52209 Unspecified astigmatism, unspecified eye: Secondary | ICD-10-CM | POA: Diagnosis not present

## 2012-02-10 DIAGNOSIS — Z23 Encounter for immunization: Secondary | ICD-10-CM | POA: Diagnosis not present

## 2012-03-11 ENCOUNTER — Other Ambulatory Visit: Payer: Self-pay | Admitting: Gastroenterology

## 2012-03-11 NOTE — Telephone Encounter (Signed)
Patient needs office visit for further refills

## 2012-03-16 ENCOUNTER — Telehealth: Payer: Self-pay | Admitting: Gastroenterology

## 2012-03-19 ENCOUNTER — Other Ambulatory Visit: Payer: Self-pay | Admitting: *Deleted

## 2012-03-19 MED ORDER — ESOMEPRAZOLE MAGNESIUM 40 MG PO CPDR: 40.0000 mg | DELAYED_RELEASE_CAPSULE | Freq: Every day | ORAL | Status: DC

## 2012-03-19 NOTE — Telephone Encounter (Signed)
Refill request via fax from [facility]. Patient will need office visit for further refills

## 2012-05-07 NOTE — Telephone Encounter (Signed)
Patient needs office visit for further refills.  Has not been seen since 2011  Left message for patient to call back

## 2012-06-29 ENCOUNTER — Ambulatory Visit: Payer: Medicare Other | Admitting: Gastroenterology

## 2012-06-30 ENCOUNTER — Telehealth: Payer: Self-pay | Admitting: Gastroenterology

## 2012-06-30 MED ORDER — ESOMEPRAZOLE MAGNESIUM 40 MG PO CPDR: 40.0000 mg | DELAYED_RELEASE_CAPSULE | Freq: Every day | ORAL | Status: DC

## 2012-06-30 NOTE — Telephone Encounter (Signed)
PATIENT NOTIFIED AND UNDERSTANDS APPOINTMENT MUST BE KEPT FOR FURTHER REFILLS

## 2012-07-02 ENCOUNTER — Ambulatory Visit (INDEPENDENT_AMBULATORY_CARE_PROVIDER_SITE_OTHER): Payer: Medicare Other | Admitting: Family Medicine

## 2012-07-02 VITALS — BP 138/75 | HR 84 | Temp 97.4°F | Resp 18 | Ht 63.5 in | Wt 170.0 lb

## 2012-07-02 DIAGNOSIS — R82998 Other abnormal findings in urine: Secondary | ICD-10-CM | POA: Diagnosis not present

## 2012-07-02 DIAGNOSIS — N760 Acute vaginitis: Secondary | ICD-10-CM

## 2012-07-02 DIAGNOSIS — R8290 Unspecified abnormal findings in urine: Secondary | ICD-10-CM

## 2012-07-02 DIAGNOSIS — R3989 Other symptoms and signs involving the genitourinary system: Secondary | ICD-10-CM

## 2012-07-02 DIAGNOSIS — B9689 Other specified bacterial agents as the cause of diseases classified elsewhere: Secondary | ICD-10-CM

## 2012-07-02 LAB — POCT URINALYSIS DIPSTICK
Bilirubin, UA: NEGATIVE
Glucose, UA: NEGATIVE
Ketones, UA: NEGATIVE
Leukocytes, UA: NEGATIVE
Nitrite, UA: NEGATIVE
Protein, UA: NEGATIVE
Spec Grav, UA: <=1.005
Urobilinogen, UA: 0.2
pH, UA: 5

## 2012-07-02 LAB — POCT UA - MICROSCOPIC ONLY
Bacteria, U Microscopic: NEGATIVE
Casts, Ur, LPF, POC: NEGATIVE
Crystals, Ur, HPF, POC: NEGATIVE
Epithelial cells, urine per micros: NEGATIVE
Mucus, UA: NEGATIVE
WBC, Ur, HPF, POC: NEGATIVE
Yeast, UA: NEGATIVE

## 2012-07-02 LAB — POCT WET PREP WITH KOH
KOH Prep POC: NEGATIVE
Trichomonas, UA: NEGATIVE
Yeast Wet Prep HPF POC: NEGATIVE

## 2012-07-02 MED ORDER — METRONIDAZOLE 500 MG PO TABS: 500.0000 mg | ORAL_TABLET | Freq: Two times a day (BID) | ORAL | Status: DC

## 2012-07-02 NOTE — Progress Notes (Signed)
[facility]:  Office Visit  Chief Complaint:  Chief Complaint  Patient presents with  . vaginal odor    2 days  . Abdominal Pain    lower abd pain    HPI: Meghan [name] is a 67 y.o. female who complains of  Vaginal odor x 2 days, and also has had increase frequency, no itching, slight peliv cpain, no blood in urine, no dysuria.  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  Has not had urinary infectionin years. No recent sex. No douching, no baths. No fevers, chills. No h/o kidney stones.   Past Medical History  Diagnosis Date  . Cancer    Past Surgical History  Procedure Laterality Date  . Breast surgery    . Cesarean section     History   Social History  . Marital Status: Married    Spouse Name: N/A    Number of Children: N/A  . Years of Education: N/A   Social History Main Topics  . Smoking status: Never Smoker   . Smokeless tobacco: None  . Alcohol Use: No  . Drug Use: No  . Sexually Active: Yes   Other Topics Concern  . None   Social History Narrative  . None   Family History  Problem Relation Age of Onset  . COPD Father   . Heart disease Father   . COPD Sister   . Heart disease Maternal Grandmother   . Heart disease Paternal Grandmother    No Known Allergies Prior to Admission medications   Medication Sig Start Date End Date Taking? Authorizing Provider  Calcium Citrate (CITRACAL PO) Take 600 mg by mouth 2 (two) times daily.   Yes Historical Provider, MD  Cholecalciferol (VITAMIN D-3) 1000 UNITS CAPS Take 1,000 Units by mouth daily.   Yes Historical Provider, MD  esomeprazole (NEXIUM) 40 MG capsule Take 1 capsule (40 mg total) by mouth daily before breakfast. 06/30/12  Yes Mardella Layman, MD  Multiple Vitamin (MULTIVITAMIN) tablet Take 1 tablet by mouth daily.   Yes Historical Provider, MD  clidinium-chlordiazePOXIDE (LIBRAX) 2.5-5 MG per capsule Take 1 capsule by mouth every 6 (six) hours. 06/17/11 06/16/12   Mardella Layman, MD     ROS: The patient denies fevers, chills, night sweats, unintentional weight loss, chest pain, palpitations, wheezing, dyspnea on exertion, nausea, vomiting, dysuria, hematuria, melena, numbness, weakness, or tingling.   All other systems have been reviewed and were otherwise negative with the exception of those mentioned in the HPI and as above.    PHYSICAL EXAM: Filed Vitals:   07/02/12 1513  BP: 138/75  Pulse: 84  Temp: 97.4 F (36.3 C)  Resp: 18   Filed Vitals:   07/02/12 1513  Height: 5' 3.5" (1.613 m)  Weight: 170 lb (77.111 kg)   Body mass index is 29.64 kg/(m^2).  General: Alert, no acute distress HEENT:  Normocephalic, atraumatic, oropharynx patent.  Cardiovascular:  Regular rate and rhythm, no rubs murmurs or gallops.  No Carotid bruits, radial pulse intact. No pedal edema.  Respiratory: Clear to auscultation bilaterally.  No wheezes, rales, or rhonchi.  No cyanosis, no use of accessory musculature GI: No organomegaly, abdomen is soft and non-tender, positive bowel sounds.  No masses. Skin: No rashes. Neurologic: Facial musculature symmetric. Psychiatric: Patient is appropriate throughout our interaction. Lymphatic: No cervical lymphadenopathy Musculoskeletal: Gait intact. Gu- dry, otherwise nl  LABS: Results for orders placed in visit on 07/02/12  POCT URINALYSIS  DIPSTICK      Result Value Range   Color, UA yellow     Clarity, UA clear     Glucose, UA negative     Bilirubin, UA negative     Ketones, UA negative     Spec Grav, UA <=1.005     Blood, UA trace-lysed     pH, UA 5.0     Protein, UA negative     Urobilinogen, UA 0.2     Nitrite, UA negative     Leukocytes, UA Negative    POCT UA - MICROSCOPIC ONLY      Result Value Range   WBC, Ur, HPF, POC negative     RBC, urine, microscopic 0-1     Bacteria, U Microscopic negative     Mucus, UA negative     Epithelial cells, urine per micros negative     Crystals, Ur, HPF, POC  negative     Casts, Ur, LPF, POC negative     Yeast, UA negative    POCT WET PREP WITH KOH      Result Value Range   Trichomonas, UA Negative     Clue Cells Wet Prep HPF POC 2-9     Epithelial Wet Prep HPF POC 3-18     Yeast Wet Prep HPF POC neg     Bacteria Wet Prep HPF POC 2+     RBC Wet Prep HPF POC 0-3     WBC Wet Prep HPF POC 1-6     KOH Prep POC Negative       EKG/XRAY:   Primary read interpreted by Dr. Conley Rolls at [facility].   ASSESSMENT/PLAN: Encounter Diagnoses  Name Primary?  . Urine discoloration Yes  . Abnormal urine odor   . Bacterial vaginosis    Rx Flagyl F/u prn     LE, THAO PHUONG, DO 07/05/2012 9:08 AM

## 2012-07-06 ENCOUNTER — Telehealth: Payer: Self-pay

## 2012-07-06 MED ORDER — METRONIDAZOLE 0.75 % VA GEL: 1.0000 | Freq: Every day | VAGINAL | Status: DC

## 2012-07-06 NOTE — Telephone Encounter (Signed)
Pt states antibiotic we prescribed is making her too sick,request a change of meds   Best phone [phone]  Pharmacy [facility]

## 2012-07-06 NOTE — Telephone Encounter (Signed)
I have sent metronidazole vaginal gel for her to use instead of the pills.  One applicator full at night for 5 days.  Continue to avoid alcohol until 48 hours after last dose.

## 2012-07-06 NOTE — Telephone Encounter (Signed)
Spoke with pt advised Rx for gel sent to pharmacy. Pt understood.

## 2012-07-06 NOTE — Telephone Encounter (Signed)
Patient states the Flagyl is making her very sick she has been advised to d/c this and we will send in a different medication. Please advise.

## 2012-07-15 ENCOUNTER — Encounter: Payer: Self-pay | Admitting: *Deleted

## 2012-07-19 DIAGNOSIS — N760 Acute vaginitis: Secondary | ICD-10-CM | POA: Diagnosis not present

## 2012-07-20 ENCOUNTER — Encounter: Payer: Self-pay | Admitting: Gastroenterology

## 2012-07-20 ENCOUNTER — Ambulatory Visit (INDEPENDENT_AMBULATORY_CARE_PROVIDER_SITE_OTHER): Payer: Medicare Other | Admitting: Gastroenterology

## 2012-07-20 VITALS — BP 114/64 | HR 93 | Ht 62.75 in | Wt 170.1 lb

## 2012-07-20 DIAGNOSIS — K5730 Diverticulosis of large intestine without perforation or abscess without bleeding: Secondary | ICD-10-CM

## 2012-07-20 DIAGNOSIS — K219 Gastro-esophageal reflux disease without esophagitis: Secondary | ICD-10-CM | POA: Diagnosis not present

## 2012-07-20 MED ORDER — CILIDINIUM-CHLORDIAZEPOXIDE 2.5-5 MG PO CAPS: 1.0000 | ORAL_CAPSULE | Freq: Four times a day (QID) | ORAL | Status: DC

## 2012-07-20 MED ORDER — ESOMEPRAZOLE MAGNESIUM 40 MG PO CPDR: 40.0000 mg | DELAYED_RELEASE_CAPSULE | Freq: Every day | ORAL | Status: DC

## 2012-07-20 NOTE — Progress Notes (Signed)
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  She currently is asymptomatic on Nexium 40 mg a day.  She also has a history of diverticulosis coli, and is up-to-date on her colonoscopy exams with removal of a benign hyperplastic polyp one year ago.  She does use when necessary Librax for colon spasms.  She denies melena, hematochezia, significant abdominal pain or any systemic complaints.  She otherwise is completely asymptomatic and is followed medically by Dr. Catha Gosselin, and reports that her liver function test and CBC have been normal.  Current Medications, Allergies, Past Medical History, Past Surgical History, Family History and Social History were reviewed in [facility] electronic medical record.  ROS: All systems were reviewed and are negative unless otherwise stated in the HPI.          Physical Exam: Superior patient in no distress.  Blood pressure 114/64, pulse 93 and regular and weight 170 with a BMI of 30.37.  98% oxygen saturation.  I cannot appreciate stigmata of chronic liver disease.  Abdominal exam shows no organomegaly, abdominal masses or tenderness.  Bowel sounds are normal.  Mental status is normal.    Assessment and Plan: GERD doing well on daily Nexium therapy which I have refilled.  She is to continue a high-fiber diet with daily FiberCon, and when necessary Librax for abdominal spasms.  I see urgently on a yearly basis unless otherwise needed.  Will send a copy this note to her primary care physician.  As mentioned above she is up-to-date on her endoscopy and colonoscopy exams.  Copy Dr. Catha Gosselin primary care No diagnosis found.

## 2012-07-20 NOTE — Patient Instructions (Signed)
Please follow up in one year with Dr. Jarold Motto.  Nexium sent to [facility].  Librax sent to [facility].

## 2012-08-02 DIAGNOSIS — K219 Gastro-esophageal reflux disease without esophagitis: Secondary | ICD-10-CM | POA: Diagnosis not present

## 2012-08-02 DIAGNOSIS — Z8249 Family history of ischemic heart disease and other diseases of the circulatory system: Secondary | ICD-10-CM | POA: Diagnosis not present

## 2012-08-02 DIAGNOSIS — H6590 Unspecified nonsuppurative otitis media, unspecified ear: Secondary | ICD-10-CM | POA: Diagnosis not present

## 2012-08-02 DIAGNOSIS — D126 Benign neoplasm of colon, unspecified: Secondary | ICD-10-CM | POA: Diagnosis not present

## 2012-08-02 DIAGNOSIS — M899 Disorder of bone, unspecified: Secondary | ICD-10-CM | POA: Diagnosis not present

## 2012-08-02 DIAGNOSIS — Z0000 Encounter for general adult medical examination without abnormal findings: Secondary | ICD-10-CM | POA: Diagnosis not present

## 2012-08-02 DIAGNOSIS — Z853 Personal history of malignant neoplasm of breast: Secondary | ICD-10-CM | POA: Diagnosis not present

## 2012-08-02 DIAGNOSIS — Z79899 Other long term (current) drug therapy: Secondary | ICD-10-CM | POA: Diagnosis not present

## 2012-09-17 DIAGNOSIS — Z136 Encounter for screening for cardiovascular disorders: Secondary | ICD-10-CM | POA: Diagnosis not present

## 2012-09-17 DIAGNOSIS — M949 Disorder of cartilage, unspecified: Secondary | ICD-10-CM | POA: Diagnosis not present

## 2012-09-17 DIAGNOSIS — Z0000 Encounter for general adult medical examination without abnormal findings: Secondary | ICD-10-CM | POA: Diagnosis not present

## 2012-09-17 DIAGNOSIS — M899 Disorder of bone, unspecified: Secondary | ICD-10-CM | POA: Diagnosis not present

## 2012-09-17 DIAGNOSIS — Z79899 Other long term (current) drug therapy: Secondary | ICD-10-CM | POA: Diagnosis not present

## 2012-09-22 DIAGNOSIS — Z0000 Encounter for general adult medical examination without abnormal findings: Secondary | ICD-10-CM | POA: Diagnosis not present

## 2012-09-22 DIAGNOSIS — M899 Disorder of bone, unspecified: Secondary | ICD-10-CM | POA: Diagnosis not present

## 2012-09-22 DIAGNOSIS — Z79899 Other long term (current) drug therapy: Secondary | ICD-10-CM | POA: Diagnosis not present

## 2012-09-22 DIAGNOSIS — M949 Disorder of cartilage, unspecified: Secondary | ICD-10-CM | POA: Diagnosis not present

## 2012-09-22 DIAGNOSIS — C50919 Malignant neoplasm of unspecified site of unspecified female breast: Secondary | ICD-10-CM | POA: Diagnosis not present

## 2012-09-22 DIAGNOSIS — K219 Gastro-esophageal reflux disease without esophagitis: Secondary | ICD-10-CM | POA: Diagnosis not present

## 2012-09-22 DIAGNOSIS — Z1331 Encounter for screening for depression: Secondary | ICD-10-CM | POA: Diagnosis not present

## 2012-09-23 ENCOUNTER — Other Ambulatory Visit: Payer: Self-pay

## 2012-09-23 DIAGNOSIS — Z1231 Encounter for screening mammogram for malignant neoplasm of breast: Secondary | ICD-10-CM

## 2012-11-02 ENCOUNTER — Ambulatory Visit
Admission: RE | Admit: 2012-11-02 | Discharge: 2012-11-02 | Disposition: A | Discharge status: Home / Self Care | Payer: Medicare Other | Source: Ambulatory Visit

## 2012-11-02 DIAGNOSIS — Z1231 Encounter for screening mammogram for malignant neoplasm of breast: Secondary | ICD-10-CM | POA: Diagnosis not present

## 2012-11-04 ENCOUNTER — Other Ambulatory Visit: Payer: Self-pay | Admitting: Obstetrics & Gynecology

## 2012-11-04 DIAGNOSIS — R928 Other abnormal and inconclusive findings on diagnostic imaging of breast: Secondary | ICD-10-CM

## 2012-11-15 DIAGNOSIS — M899 Disorder of bone, unspecified: Secondary | ICD-10-CM | POA: Diagnosis not present

## 2012-11-15 DIAGNOSIS — N760 Acute vaginitis: Secondary | ICD-10-CM | POA: Diagnosis not present

## 2012-11-15 DIAGNOSIS — Z124 Encounter for screening for malignant neoplasm of cervix: Secondary | ICD-10-CM | POA: Diagnosis not present

## 2012-11-15 DIAGNOSIS — Z13820 Encounter for screening for osteoporosis: Secondary | ICD-10-CM | POA: Diagnosis not present

## 2012-11-15 DIAGNOSIS — M949 Disorder of cartilage, unspecified: Secondary | ICD-10-CM | POA: Diagnosis not present

## 2012-11-15 DIAGNOSIS — Z1212 Encounter for screening for malignant neoplasm of rectum: Secondary | ICD-10-CM | POA: Diagnosis not present

## 2012-11-15 DIAGNOSIS — Z130 Encounter for screening for diseases of the blood and blood-forming organs and certain disorders involving the immune mechanism: Secondary | ICD-10-CM | POA: Diagnosis not present

## 2012-11-17 ENCOUNTER — Ambulatory Visit
Admission: RE | Admit: 2012-11-17 | Discharge: 2012-11-17 | Disposition: A | Discharge status: Home / Self Care | Payer: Medicare Other | Source: Ambulatory Visit | Attending: Obstetrics & Gynecology | Admitting: Obstetrics & Gynecology

## 2012-11-17 ENCOUNTER — Other Ambulatory Visit: Payer: Self-pay | Admitting: Obstetrics & Gynecology

## 2012-11-17 DIAGNOSIS — R928 Other abnormal and inconclusive findings on diagnostic imaging of breast: Secondary | ICD-10-CM

## 2012-11-17 DIAGNOSIS — N6310 Unspecified lump in the right breast, unspecified quadrant: Secondary | ICD-10-CM

## 2012-11-23 ENCOUNTER — Ambulatory Visit
Admission: RE | Admit: 2012-11-23 | Discharge: 2012-11-23 | Disposition: A | Discharge status: Home / Self Care | Payer: Medicare Other | Source: Ambulatory Visit | Attending: Obstetrics & Gynecology | Admitting: Obstetrics & Gynecology

## 2012-11-23 ENCOUNTER — Other Ambulatory Visit: Payer: Self-pay | Admitting: Obstetrics & Gynecology

## 2012-11-23 DIAGNOSIS — N6009 Solitary cyst of unspecified breast: Secondary | ICD-10-CM | POA: Diagnosis not present

## 2012-11-23 DIAGNOSIS — N6310 Unspecified lump in the right breast, unspecified quadrant: Secondary | ICD-10-CM

## 2013-01-31 DIAGNOSIS — N390 Urinary tract infection, site not specified: Secondary | ICD-10-CM | POA: Diagnosis not present

## 2013-01-31 DIAGNOSIS — R300 Dysuria: Secondary | ICD-10-CM | POA: Diagnosis not present

## 2013-03-13 DIAGNOSIS — N390 Urinary tract infection, site not specified: Secondary | ICD-10-CM | POA: Diagnosis not present

## 2013-06-26 ENCOUNTER — Other Ambulatory Visit: Payer: Self-pay | Admitting: Gastroenterology

## 2013-06-29 DIAGNOSIS — L723 Sebaceous cyst: Secondary | ICD-10-CM | POA: Diagnosis not present

## 2013-07-05 DIAGNOSIS — L723 Sebaceous cyst: Secondary | ICD-10-CM | POA: Diagnosis not present

## 2013-07-18 ENCOUNTER — Telehealth: Payer: Self-pay | Admitting: Gastroenterology

## 2013-07-18 ENCOUNTER — Telehealth: Payer: Self-pay | Admitting: *Deleted

## 2013-07-18 MED ORDER — ESOMEPRAZOLE MAGNESIUM 40 MG PO CPDR: 40.0000 mg | DELAYED_RELEASE_CAPSULE | Freq: Every day | ORAL | Status: DC

## 2013-07-18 NOTE — Telephone Encounter (Signed)
Okay to refill? 

## 2013-07-18 NOTE — Telephone Encounter (Signed)
LMOM for patient to call office back. 

## 2013-07-18 NOTE — Addendum Note (Signed)
Addended by: Hope Pigeon A on: 07/18/2013 10:21 AM   Modules accepted: Orders

## 2013-07-18 NOTE — Telephone Encounter (Signed)
Patient made a follow up appointment with you for May. Is it ok to refill Nexium?

## 2013-09-09 ENCOUNTER — Ambulatory Visit (INDEPENDENT_AMBULATORY_CARE_PROVIDER_SITE_OTHER): Payer: Medicare Other | Admitting: Internal Medicine

## 2013-09-09 ENCOUNTER — Encounter: Payer: Self-pay | Admitting: Internal Medicine

## 2013-09-09 VITALS — BP 128/72 | HR 84 | Ht 62.75 in | Wt 180.0 lb

## 2013-09-09 DIAGNOSIS — K219 Gastro-esophageal reflux disease without esophagitis: Secondary | ICD-10-CM | POA: Diagnosis not present

## 2013-09-09 DIAGNOSIS — K222 Esophageal obstruction: Secondary | ICD-10-CM

## 2013-09-09 DIAGNOSIS — K5730 Diverticulosis of large intestine without perforation or abscess without bleeding: Secondary | ICD-10-CM

## 2013-09-09 MED ORDER — CILIDINIUM-CHLORDIAZEPOXIDE 2.5-5 MG PO CAPS: 1.0000 | ORAL_CAPSULE | ORAL | Status: DC | PRN

## 2013-09-09 NOTE — Progress Notes (Signed)
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 She has a history of GERD complicated by peptic stricture for which she underwent esophageal dilation in March of 2007. She has been maintained on PPI. No classic reflux symptoms. No recurrence of dysphagia. Tolerating the medication well. Has questions regarding long-term use of PPI. Also, prior screening colonoscopy in December of 2011 with hyperplastic polyp only. Severe diverticulosis present. No lower GI complaints. Otherwise well.  REVIEW OF SYSTEMS:  All non-GI ROS negative upon review  Past Medical History  Diagnosis Date  . Breast cancer   . Esophageal stricture   . GERD (gastroesophageal reflux disease)   . Diverticulosis of colon (without mention of hemorrhage)   . Colon polyps     Hyperplastic     Past Surgical History  Procedure Laterality Date  . Breast surgery    . Cesarean section      Social History Meghan [name]  reports that she has never smoked. She has never used smokeless tobacco. She reports that she does not drink alcohol or use illicit drugs.  family history includes COPD in her father and sister; Heart disease in her father, maternal grandmother, and paternal grandmother.  No Known Allergies     PHYSICAL EXAMINATION: Vital signs: BP 128/72  Pulse 84  Ht 5' 2.75" (1.594 m)  Wt 180 lb (81.647 kg)  BMI 32.13 kg/m2 General: Well-developed, well-nourished, no acute distress HEENT: Sclerae are anicteric, conjunctiva pink. Oral mucosa intact Lungs: Clear Heart: Regular Abdomen: soft, nontender, nondistended, no obvious ascites, no peritoneal signs, normal bowel sounds. No organomegaly. Extremities: No edema Psychiatric: alert and oriented x3. Cooperative   ASSESSMENT:  #1. GERD complicated by peptic stricture. She remains  asymptomatic post dilation on PPI. #2. Screening colonoscopy December 2011 as described  PLAN:  #1. Discussion on different concern is raised regarding long term PPI use. No change in therapy recommended #2. Reflux precautions #3. Routine followup in 1 year. Sooner if needed #4. Surveillance colonoscopy around 2021 #5. Nexium prescription refilled.

## 2013-09-09 NOTE — Patient Instructions (Signed)
We have sent the following medications to your pharmacy for you to pick up at your convenience:  Librax  Please follow up with Dr. Henrene Pastor in one year

## 2013-09-23 DIAGNOSIS — N390 Urinary tract infection, site not specified: Secondary | ICD-10-CM | POA: Diagnosis not present

## 2013-09-23 DIAGNOSIS — N3000 Acute cystitis without hematuria: Secondary | ICD-10-CM | POA: Diagnosis not present

## 2013-09-27 ENCOUNTER — Other Ambulatory Visit: Payer: Self-pay | Admitting: Internal Medicine

## 2013-09-28 DIAGNOSIS — Z79899 Other long term (current) drug therapy: Secondary | ICD-10-CM | POA: Diagnosis not present

## 2013-09-28 DIAGNOSIS — Z0000 Encounter for general adult medical examination without abnormal findings: Secondary | ICD-10-CM | POA: Diagnosis not present

## 2013-09-28 DIAGNOSIS — G609 Hereditary and idiopathic neuropathy, unspecified: Secondary | ICD-10-CM | POA: Diagnosis not present

## 2013-09-28 DIAGNOSIS — C50919 Malignant neoplasm of unspecified site of unspecified female breast: Secondary | ICD-10-CM | POA: Diagnosis not present

## 2013-09-28 DIAGNOSIS — Z1331 Encounter for screening for depression: Secondary | ICD-10-CM | POA: Diagnosis not present

## 2013-09-28 DIAGNOSIS — K219 Gastro-esophageal reflux disease without esophagitis: Secondary | ICD-10-CM | POA: Diagnosis not present

## 2013-09-28 DIAGNOSIS — M899 Disorder of bone, unspecified: Secondary | ICD-10-CM | POA: Diagnosis not present

## 2013-09-28 DIAGNOSIS — H6120 Impacted cerumen, unspecified ear: Secondary | ICD-10-CM | POA: Diagnosis not present

## 2013-09-28 DIAGNOSIS — Z23 Encounter for immunization: Secondary | ICD-10-CM | POA: Diagnosis not present

## 2013-09-30 DIAGNOSIS — C50919 Malignant neoplasm of unspecified site of unspecified female breast: Secondary | ICD-10-CM | POA: Diagnosis not present

## 2013-09-30 DIAGNOSIS — Z79899 Other long term (current) drug therapy: Secondary | ICD-10-CM | POA: Diagnosis not present

## 2013-09-30 DIAGNOSIS — M949 Disorder of cartilage, unspecified: Secondary | ICD-10-CM | POA: Diagnosis not present

## 2013-09-30 DIAGNOSIS — Z1331 Encounter for screening for depression: Secondary | ICD-10-CM | POA: Diagnosis not present

## 2013-09-30 DIAGNOSIS — M899 Disorder of bone, unspecified: Secondary | ICD-10-CM | POA: Diagnosis not present

## 2013-09-30 DIAGNOSIS — Z136 Encounter for screening for cardiovascular disorders: Secondary | ICD-10-CM | POA: Diagnosis not present

## 2013-09-30 DIAGNOSIS — G609 Hereditary and idiopathic neuropathy, unspecified: Secondary | ICD-10-CM | POA: Diagnosis not present

## 2013-09-30 DIAGNOSIS — K219 Gastro-esophageal reflux disease without esophagitis: Secondary | ICD-10-CM | POA: Diagnosis not present

## 2013-10-04 ENCOUNTER — Other Ambulatory Visit: Payer: Self-pay

## 2013-10-04 DIAGNOSIS — Z853 Personal history of malignant neoplasm of breast: Secondary | ICD-10-CM

## 2013-10-04 DIAGNOSIS — Z1231 Encounter for screening mammogram for malignant neoplasm of breast: Secondary | ICD-10-CM

## 2013-10-04 DIAGNOSIS — Z9889 Other specified postprocedural states: Secondary | ICD-10-CM

## 2013-10-20 ENCOUNTER — Encounter: Payer: Self-pay | Admitting: Neurology

## 2013-10-20 ENCOUNTER — Ambulatory Visit (INDEPENDENT_AMBULATORY_CARE_PROVIDER_SITE_OTHER): Payer: Medicare Other | Admitting: Radiology

## 2013-10-20 ENCOUNTER — Telehealth: Payer: Self-pay | Admitting: Neurology

## 2013-10-20 ENCOUNTER — Ambulatory Visit (INDEPENDENT_AMBULATORY_CARE_PROVIDER_SITE_OTHER): Payer: Medicare Other | Admitting: Neurology

## 2013-10-20 VITALS — BP 145/83 | HR 109 | Ht 63.0 in | Wt 180.0 lb

## 2013-10-20 DIAGNOSIS — R209 Unspecified disturbances of skin sensation: Secondary | ICD-10-CM

## 2013-10-20 MED ORDER — GABAPENTIN 100 MG PO CAPS: 100.0000 mg | ORAL_CAPSULE | Freq: Every day | ORAL | Status: DC

## 2013-10-20 NOTE — Progress Notes (Signed)
Reason for visit: Numbness  Meghan [name] is a 68 y.o. female  History of present illness:  Ms. Job is a 68 year old right-handed white female with a history of breast cancer approximately 9 years ago. The patient had a lumpectomy and then had radiation therapy. She did not receive chemotherapy. She was on tamoxifen, taking this medication for 5 years, but she has been off of the medication for 4 years. The patient has had onset of symptoms of restless legs over the last 3 months or so, and within the last 2 months, she has developed some burning sensations and tingling in the feet, primarily on the bottom of the feet. Within the last several weeks, she has had onset of similar symptoms in the fingers of the hands. She denies any neck discomfort, or any increased sensory symptoms with movement of the head or neck. The patient denies low back pain. She denies any weakness of the arms or legs, balance problems, or problems controlling the bowels or the bladder. Her primary care physician has already checked blood work that includes a thyroid profile and a B12 level that were unremarkable. She is not sleeping well because of the discomfort. She was tried on gabapentin taking 300 mg at night but this resulted in too much drowsiness. The patient is sent to this office for further evaluation. There is no family history of peripheral neuropathy.  Past Medical History  Diagnosis Date  . Breast cancer   . Esophageal stricture   . GERD (gastroesophageal reflux disease)   . Diverticulosis of colon (without mention of hemorrhage)   . Colon polyps     Hyperplastic   . Restless legs syndrome (RLS)     Past Surgical History  Procedure Laterality Date  . Breast surgery      left 2006  . Cesarean section      Family History  Problem Relation Age of Onset  . COPD Father   . Heart disease Father   . Breast cancer Sister   . Heart disease Maternal Grandmother   . Heart disease Paternal  Grandmother   . Obesity Brother   . Heart attack Mother   . Neuropathy Neg Hx     Social history:  reports that she has never smoked. She has never used smokeless tobacco. She reports that she does not drink alcohol or use illicit drugs.  Medications:  Current Outpatient Prescriptions on File Prior to Visit  Medication Sig Dispense Refill  . Calcium Citrate (CITRACAL PO) Take 600 mg by mouth 2 (two) times daily.      . Cholecalciferol (VITAMIN D-3) 1000 UNITS CAPS Take 1,000 Units by mouth daily.      . clidinium-chlordiazePOXIDE (LIBRAX) 5-2.5 MG per capsule Take 1 capsule by mouth as needed.  100 capsule  3  . esomeprazole (NEXIUM) 40 MG capsule Take 1 capsule (40 mg total) by mouth daily.  90 capsule  1  . fluticasone (FLONASE) 50 MCG/ACT nasal spray Place 1 spray into both nostrils as needed for allergies or rhinitis.      . Inulin (FIBER CHOICE FRUITY BITES PO) Take 2 tablets by mouth at bedtime.      . Misc Natural Products (OSTEO BI-FLEX ADV TRIPLE ST) TABS Take 1 tablet by mouth 2 (two) times daily.      . Multiple Vitamin (MULTIVITAMIN) tablet Take 1 tablet by mouth daily.      . Omega-3 Fatty Acids (FISH OIL) 1200 MG CAPS Take 1 capsule by  mouth daily.       No current facility-administered medications on file prior to visit.     No Known Allergies  ROS:  Out of a complete 14 system review of symptoms, the patient complains only of the following symptoms, and all other reviewed systems are negative.  Numbness of the extremities Restless legs  Blood pressure 145/83, pulse 109, height 5\' 3"  (1.6 m), weight 180 lb (81.647 kg).  Physical Exam  General: The patient is alert and cooperative at the time of the examination. The patient is minimally to moderately obese.  Eyes: Pupils are equal, round, and reactive to light. Discs are flat bilaterally.  Neck: The neck is supple, no carotid bruits are noted.  Respiratory: The respiratory examination is  clear.  Cardiovascular: The cardiovascular examination reveals a regular rate and rhythm, no obvious murmurs or rubs are noted.   Neuromuscular: Range of movement of the cervical spine was full.  Skin: Extremities are without significant edema.  Neurologic Exam  Mental status: The patient is alert and oriented x 3 at the time of the examination. The patient has apparent normal recent and remote memory, with an apparently normal attention span and concentration ability.  Cranial nerves: Facial symmetry is present. There is good sensation of the face to pinprick and soft touch bilaterally. The strength of the facial muscles and the muscles to head turning and shoulder shrug are normal bilaterally. Speech is well enunciated, no aphasia or dysarthria is noted. Extraocular movements are full. Visual fields are full. The tongue is midline, and the patient has symmetric elevation of the soft palate. No obvious hearing deficits are noted.  Motor: The motor testing reveals 5 over 5 strength of all 4 extremities. Good symmetric motor tone is noted throughout.  Sensory: Sensory testing is intact to pinprick, soft touch, vibration sensation, and position sense on all 4 extremities. No evidence of a stocking pattern pinprick sensory deficit is noted in the arms or legs. No evidence of extinction is noted.  Coordination: Cerebellar testing reveals good finger-nose-finger and heel-to-shin bilaterally.  Gait and station: Gait is normal. Tandem gait is normal. Romberg is negative. No drift is seen.  Reflexes: Deep tendon reflexes are symmetric, but are depressed bilaterally. Toes are downgoing bilaterally.   Assessment/Plan:  1. Sensory alteration, all 4 extremities  The patient has a history of a subacute onset of what sounds like a peripheral neuropathy with sensory alteration in the hands and feet occurring within the last 2 months. The patient will be set up for nerve conduction studies, further  blood work today. MRI of the cervical spine will be done given the subacute onset of sensory alterations. If the above workup is unremarkable, she will be sent for further blood work analysis to exclude a paraneoplastic antibody syndrome. The patient will followup in 4 months. She will be given a trial on gabapentin at the 100 mg dose at nighttime. Onset of CIDP he needs to be excluded.  Jill Alexanders MD 10/20/2013 8:58 AM  [facility] [address] [place], [place] [address]  Phone [phone] Fax [phone]

## 2013-10-20 NOTE — Telephone Encounter (Signed)
I called patient. Nerve conduction studies of the left arm, both legs were normal. No evidence of a neuropathy is seen. A small fiber neuropathy or a very early peripheral neuropathy can be missed on nerve conduction studies, however. The patient will have MRI evaluation of the cervical spine, further blood work done.

## 2013-10-20 NOTE — Procedures (Signed)
     HISTORY:  Meghan [name] is a 68 year old patient with a prior history of breast cancer with a two-month history of paresthesias and discomfort involving all 4 extremities. The patient is being evaluated for a possible peripheral neuropathy.  NERVE CONDUCTION STUDIES:  Nerve conduction studies were performed on the left upper extremity. The distal motor latencies and motor amplitudes for the median and ulnar nerves were within normal limits. The F wave latencies and nerve conduction velocities for these nerves were also normal. The sensory latencies for the median and ulnar nerves were normal.  Nerve conduction studies were performed on both lower extremities. The distal motor latencies and motor amplitudes for the peroneal and posterior tibial nerves were within normal limits. The nerve conduction velocities for these nerves were also normal. The F wave latencies for the peroneal and posterior tibial nerves were normal bilaterally. The sensory latencies for the peroneal nerves were within normal limits.   EMG STUDIES:  EMG evaluation was not performed.  IMPRESSION:  Nerve conduction studies involving the left upper extremity and both lower extremities were within normal limits. No clear evidence of a peripheral neuropathy is seen. A small fiber neuropathy may be missed by a standard nerve conduction studies, however. Clinical correlation is required.  Jill Alexanders MD 10/20/2013 10:57 AM  [facility] [address] [place], [place] [address]  Phone [phone] Fax [phone]

## 2013-10-21 DIAGNOSIS — R300 Dysuria: Secondary | ICD-10-CM | POA: Diagnosis not present

## 2013-10-21 DIAGNOSIS — N390 Urinary tract infection, site not specified: Secondary | ICD-10-CM | POA: Diagnosis not present

## 2013-10-24 LAB — IFE AND PE, SERUM
ALBUMIN/GLOB SERPL: 1.3 (ref 0.7–2.0)
Albumin SerPl Elph-Mcnc: 4 g/dL (ref 3.2–5.6)
Alpha 1: 0.2 g/dL (ref 0.1–0.4)
Alpha2 Glob SerPl Elph-Mcnc: 0.8 g/dL (ref 0.4–1.2)
B-GLOBULIN SERPL ELPH-MCNC: 1 g/dL (ref 0.6–1.3)
GAMMA GLOB SERPL ELPH-MCNC: 1.3 g/dL (ref 0.5–1.6)
GLOBULIN, TOTAL: 3.3 g/dL (ref 2.0–4.5)
IGG (IMMUNOGLOBIN G), SERUM: 1219 mg/dL (ref 700–1600)
IgA/Immunoglobulin A, Serum: 214 mg/dL (ref 91–414)
IgM (Immunoglobulin M), Srm: 431 mg/dL — ABNORMAL HIGH (ref 40–230)
TOTAL PROTEIN: 7.3 g/dL (ref 6.0–8.5)

## 2013-10-24 LAB — COPPER, SERUM: Copper: 117 ug/dL (ref 72–166)

## 2013-10-24 LAB — SEDIMENTATION RATE: Sed Rate: 22 mm/hr (ref 0–40)

## 2013-10-24 LAB — ENA+DNA/DS+SJORGEN'S
ENA RNP AB: 0.2 AI (ref 0.0–0.9)
ENA SSA (RO) AB: 0.3 AI (ref 0.0–0.9)
dsDNA Ab: 11 IU/mL — ABNORMAL HIGH (ref 0–9)

## 2013-10-24 LAB — ANGIOTENSIN CONVERTING ENZYME: ANGIO CONVERT ENZYME: 51 U/L (ref 14–82)

## 2013-10-24 LAB — ANA W/REFLEX: ANA: POSITIVE — AB

## 2013-10-24 LAB — RHEUMATOID FACTOR: RHEUMATOID FACTOR: 8.5 [IU]/mL (ref 0.0–13.9)

## 2013-10-24 LAB — LYME, TOTAL AB TEST/REFLEX: Lyme IgG/IgM Ab: <0.91 {ISR} (ref 0.00–0.90)

## 2013-10-24 LAB — RPR: SYPHILIS RPR SCR: NONREACTIVE

## 2013-10-25 ENCOUNTER — Encounter: Payer: Self-pay | Admitting: Internal Medicine

## 2013-10-28 ENCOUNTER — Ambulatory Visit
Admission: RE | Admit: 2013-10-28 | Discharge: 2013-10-28 | Disposition: A | Discharge status: Home / Self Care | Payer: Medicare Other | Source: Ambulatory Visit | Attending: Neurology | Admitting: Neurology

## 2013-10-28 DIAGNOSIS — M5020 Other cervical disc displacement, unspecified cervical region: Secondary | ICD-10-CM | POA: Diagnosis not present

## 2013-10-28 DIAGNOSIS — R209 Unspecified disturbances of skin sensation: Secondary | ICD-10-CM

## 2013-10-31 ENCOUNTER — Telehealth: Payer: Self-pay | Admitting: Neurology

## 2013-10-31 NOTE — Telephone Encounter (Signed)
I called patient. MRI of the cervical spine was unrevealing. Blood work was unremarkable. The patient continues to have sensory alteration on all 4 extremities, could represent a small fiber neuropathy still even though nerve conduction studies were unremarkable. The patient does not have weakness. She is 68 years old, onset of multiple sclerosis at this age would be unusual, but not impossible. We may consider MRI the brain at some point, but the patient will be increased on the gabapentin slowly to help the pain. She is on 100 mg at night currently, she'll go to 100 mg twice daily for 5 days, and then 100 mg 3 times daily. She could not tolerate the 300 mg dosing.    MRI cervical spine 10/31/2013:  Impression   Unremarkable MRI cervical spine (without). Mild disc bulging from C3-4 to  C6-7. No spinal stenosis or foraminal stenosis.

## 2013-11-03 ENCOUNTER — Ambulatory Visit: Payer: Medicare Other

## 2013-11-03 ENCOUNTER — Telehealth: Payer: Self-pay | Admitting: Neurology

## 2013-11-03 ENCOUNTER — Ambulatory Visit
Admission: RE | Admit: 2013-11-03 | Discharge: 2013-11-03 | Disposition: A | Discharge status: Home / Self Care | Payer: Medicare Other | Source: Ambulatory Visit

## 2013-11-03 DIAGNOSIS — Z1231 Encounter for screening mammogram for malignant neoplasm of breast: Secondary | ICD-10-CM | POA: Diagnosis not present

## 2013-11-03 DIAGNOSIS — Z853 Personal history of malignant neoplasm of breast: Secondary | ICD-10-CM

## 2013-11-03 DIAGNOSIS — Z9889 Other specified postprocedural states: Secondary | ICD-10-CM

## 2013-11-03 MED ORDER — GABAPENTIN 100 MG PO CAPS: ORAL_CAPSULE | ORAL | Status: DC

## 2013-11-03 NOTE — Telephone Encounter (Signed)
Per phone note:  We may consider MRI the brain at some point, but the patient will be increased on the gabapentin slowly to help the pain. She is on 100 mg at night currently, she'll go to 100 mg twice daily for 5 days, and then 100 mg 3 times daily.  Rx has been updated and sent to the pharmacy.

## 2013-11-03 NOTE — Telephone Encounter (Signed)
Patient needing new Rx of Gabapentin written with increase dosage 100 mg twice daily for 5 days, then 100 mg twice a daily (per telephone conversation 6/29 with Dr. Jannifer Franklin).

## 2013-11-16 DIAGNOSIS — Z124 Encounter for screening for malignant neoplasm of cervix: Secondary | ICD-10-CM | POA: Diagnosis not present

## 2013-12-12 ENCOUNTER — Encounter: Payer: Self-pay | Admitting: Gastroenterology

## 2014-01-26 ENCOUNTER — Encounter: Payer: Self-pay | Admitting: *Deleted

## 2014-01-30 ENCOUNTER — Other Ambulatory Visit: Payer: Self-pay

## 2014-01-30 ENCOUNTER — Encounter: Payer: Self-pay | Admitting: Neurology

## 2014-01-30 MED ORDER — GABAPENTIN 100 MG PO CAPS: ORAL_CAPSULE | ORAL | Status: DC

## 2014-02-08 DIAGNOSIS — H52203 Unspecified astigmatism, bilateral: Secondary | ICD-10-CM | POA: Diagnosis not present

## 2014-02-08 DIAGNOSIS — H43813 Vitreous degeneration, bilateral: Secondary | ICD-10-CM | POA: Diagnosis not present

## 2014-02-18 ENCOUNTER — Other Ambulatory Visit: Payer: Self-pay | Admitting: Internal Medicine

## 2014-02-27 ENCOUNTER — Other Ambulatory Visit: Payer: Self-pay | Admitting: Gastroenterology

## 2014-03-27 ENCOUNTER — Ambulatory Visit (INDEPENDENT_AMBULATORY_CARE_PROVIDER_SITE_OTHER): Payer: Medicare Other | Admitting: Neurology

## 2014-03-27 ENCOUNTER — Encounter: Payer: Self-pay | Admitting: Neurology

## 2014-03-27 VITALS — BP 149/88 | HR 89 | Ht 64.0 in | Wt 189.0 lb

## 2014-03-27 DIAGNOSIS — R209 Unspecified disturbances of skin sensation: Secondary | ICD-10-CM

## 2014-03-27 DIAGNOSIS — G2581 Restless legs syndrome: Secondary | ICD-10-CM | POA: Insufficient documentation

## 2014-03-27 HISTORY — DX: Restless legs syndrome: G25.81

## 2014-03-27 MED ORDER — GABAPENTIN 100 MG PO CAPS: ORAL_CAPSULE | ORAL | Status: DC

## 2014-03-27 NOTE — Progress Notes (Signed)
Reason for visit: Restless leg syndrome  Meghan [name] is an 68 y.o. female  History of present illness:  Ms. Mullinax is a 68 year old right-handed white female with a history of onset of numbness and tingling sensations on all 4 extremities. Workup did not show evidence of a peripheral neuropathy, but a small fiber neuropathy cannot be excluded. Blood work was relatively unremarkable, an MRI of the cervical spine did not show spinal cord impingement or evidence of demyelinating disease. The patient indicates that the numbness has essentially gone away, but she has a restless legs type syndrome. The patient has been placed on low-dose gabapentin, and she has done quite well taking 100 mg 3 times daily. The patient is sleeping well at this time, without any further complaints. The patient returns to this office for further evaluation. She reports no other new medical issues that have come up since last seen.  Past Medical History  Diagnosis Date  . Breast cancer   . Esophageal stricture   . GERD (gastroesophageal reflux disease)   . Diverticulosis of colon (without mention of hemorrhage)   . Colon polyps     Hyperplastic   . Restless legs syndrome (RLS)   . Restless leg syndrome 03/27/2014    Past Surgical History  Procedure Laterality Date  . Breast surgery      left 2006  . Cesarean section      Family History  Problem Relation Age of Onset  . COPD Father   . Heart disease Father   . Breast cancer Sister   . Heart disease Maternal Grandmother   . Heart disease Paternal Grandmother   . Obesity Brother   . Heart attack Mother   . Neuropathy Neg Hx     Social history:  reports that she has never smoked. She has never used smokeless tobacco. She reports that she does not drink alcohol or use illicit drugs.   No Known Allergies  Medications:  Current Outpatient Prescriptions on File Prior to Visit  Medication Sig Dispense Refill  . Calcium Citrate (CITRACAL PO) Take  600 mg by mouth 2 (two) times daily.    . Cholecalciferol (VITAMIN D-3) 1000 UNITS CAPS Take 1,000 Units by mouth daily.    . clidinium-chlordiazePOXIDE (LIBRAX) 5-2.5 MG per capsule Take 1 capsule by mouth as needed. 100 capsule 3  . fluticasone (FLONASE) 50 MCG/ACT nasal spray Place 1 spray into both nostrils as needed for allergies or rhinitis.    . Inulin (FIBER CHOICE FRUITY BITES PO) Take 2 tablets by mouth at bedtime.    . Misc Natural Products (OSTEO BI-FLEX ADV TRIPLE ST) TABS Take 1 tablet by mouth 2 (two) times daily.    . Multiple Vitamin (MULTIVITAMIN) tablet Take 1 tablet by mouth daily.    Marland Kitchen NEXIUM 40 MG capsule TAKE 1 CAPSULE DAILY 90 capsule 0  . Omega-3 Fatty Acids (FISH OIL) 1200 MG CAPS Take 1 capsule by mouth daily.     No current facility-administered medications on file prior to visit.    ROS:  Out of a complete 14 system review of symptoms, the patient complains only of the following symptoms, and all other reviewed systems are negative.  Restless legs  Blood pressure 149/88, pulse 89, height 5\' 4"  (1.626 m), weight 189 lb (85.73 kg).  Physical Exam  General: The patient is alert and cooperative at the time of the examination.  Skin: No significant peripheral edema is noted.   Neurologic Exam  Mental status:  The patient is oriented x 3.  Cranial nerves: Facial symmetry is present. Speech is normal, no aphasia or dysarthria is noted. Extraocular movements are full. Visual fields are full.  Motor: The patient has good strength in all 4 extremities.  Sensory examination: Soft touch sensation is symmetric on the face, arms, and legs.  Coordination: The patient has good finger-nose-finger and heel-to-shin bilaterally.  Gait and station: The patient has a normal gait. Tandem gait is normal. Romberg is negative. No drift is seen.  Reflexes: Deep tendon reflexes are symmetric.   Assessment/Plan:  1. Restless leg syndrome  The patient doing very well at  this time on gabapentin only. The patient takes 100 mg 3 times daily, and if needed, the dose was increased. The patient otherwise will follow-up in 6 months. She will contact our office if new issues arise. She reports no weakness or gait instability.  Jill Alexanders MD 03/27/2014 8:23 AM  [facility] [address] [place], [place] [address]  Phone [phone] Fax [phone]

## 2014-03-27 NOTE — Patient Instructions (Signed)
Restless Legs Syndrome Restless legs syndrome is a movement disorder. It may also be called a sensorimotor disorder.  CAUSES  No one knows what specifically causes restless legs syndrome, but it tends to run in families. It is also more common in people with low iron, in pregnancy, in people who need dialysis, and those with nerve damage (neuropathy).Some medications may make restless legs syndrome worse.Those medications include drugs to treat high blood pressure, some heart conditions, nausea, colds, allergies, and depression. SYMPTOMS Symptoms include uncomfortable sensations in the legs. These leg sensations are worse during periods of inactivity or rest. They are also worse while sitting or lying down. Individuals that have the disorder describe sensations in the legs that feel like:  Pulling.  Drawing.  Crawling.  Worming.  Boring.  Tingling.  Pins and needles.  Prickling.  Pain. The sensations are usually accompanied by an overwhelming urge to move the legs. Sudden muscle jerks may also occur. Movement provides temporary relief from the discomfort. In rare cases, the arms may also be affected. Symptoms may interfere with going to sleep (sleep onset insomnia). Restless legs syndrome may also be related to periodic limb movement disorder (PLMD). PLMD is another more common motor disorder. It also causes interrupted sleep. The symptoms from PLMD usually occur most often when you are awake. TREATMENT  Treatment for restless legs syndrome is symptomatic. This means that the symptoms are treated.   Massage and cold compresses may provide temporary relief.  Walk, stretch, or take a cold or hot bath.  Get regular exercise and a good night's sleep.  Avoid caffeine, alcohol, nicotine, and medications that can make it worse.  Do activities that provide mental stimulation like discussions, needlework, and video games. These may be helpful if you are not able to walk or stretch. Some  medications are effective in relieving the symptoms. However, many of these medications have side effects. Ask your caregiver about medications that may help your symptoms. Correcting iron deficiency may improve symptoms for some patients. Document Released: 04/11/2002 Document Revised: 09/05/2013 Document Reviewed: 07/18/2010 ExitCare Patient Information 2015 [employer]. This information is not intended to replace advice given to you by your health care provider. Make sure you discuss any questions you have with your health care provider.  

## 2014-05-03 ENCOUNTER — Other Ambulatory Visit: Payer: Self-pay | Admitting: Internal Medicine

## 2014-06-22 DIAGNOSIS — L720 Epidermal cyst: Secondary | ICD-10-CM | POA: Diagnosis not present

## 2014-07-17 ENCOUNTER — Other Ambulatory Visit: Payer: Self-pay | Admitting: Surgery

## 2014-07-17 DIAGNOSIS — L723 Sebaceous cyst: Secondary | ICD-10-CM | POA: Diagnosis not present

## 2014-07-17 DIAGNOSIS — L720 Epidermal cyst: Secondary | ICD-10-CM | POA: Diagnosis not present

## 2014-07-21 ENCOUNTER — Other Ambulatory Visit: Payer: Self-pay | Admitting: Neurology

## 2014-07-23 DIAGNOSIS — R05 Cough: Secondary | ICD-10-CM | POA: Diagnosis not present

## 2014-09-03 DIAGNOSIS — M79674 Pain in right toe(s): Secondary | ICD-10-CM

## 2014-09-03 HISTORY — DX: Pain in right toe(s): M79.674

## 2014-09-25 ENCOUNTER — Ambulatory Visit (INDEPENDENT_AMBULATORY_CARE_PROVIDER_SITE_OTHER): Payer: Medicare Other | Admitting: Nurse Practitioner

## 2014-09-25 ENCOUNTER — Encounter: Payer: Self-pay | Admitting: Nurse Practitioner

## 2014-09-25 VITALS — BP 140/82 | HR 82 | Ht 64.0 in | Wt 184.0 lb

## 2014-09-25 DIAGNOSIS — R209 Unspecified disturbances of skin sensation: Secondary | ICD-10-CM

## 2014-09-25 DIAGNOSIS — G2581 Restless legs syndrome: Secondary | ICD-10-CM | POA: Diagnosis not present

## 2014-09-25 MED ORDER — GABAPENTIN 100 MG PO CAPS: 100.0000 mg | ORAL_CAPSULE | Freq: Three times a day (TID) | ORAL | Status: DC

## 2014-09-25 NOTE — Progress Notes (Signed)
[facility]  PATIENT: Meghan [name] DOB: 20-Jul-1945   REASON FOR VISIT: Follow-up for restless legs HISTORY FROM: Patient    HISTORY OF PRESENT ILLNESS:Meghan [name] is a 69 year old right-handed white female with a history of onset of numbness and tingling sensations on all 4 extremities. Workup did not show evidence of a peripheral neuropathy, but a small fiber neuropathy cannot be excluded. Blood work was relatively unremarkable, an MRI of the cervical spine did not show spinal cord impingement or evidence of demyelinating disease. The patient indicates that the numbness has essentially gone away, but she has a restless legs type syndrome. The patient has been placed on low-dose gabapentin, and she has done quite well taking 100 mg in the morning and 200 mg at night  The patient is sleeping well at this time, without any further complaints. The patient returns to this office for re evaluation. She reports no other new medical issues that have come up since last seen.   REVIEW OF SYSTEMS: Full 14 system review of systems performed and notable only for those listed, all others are neg:  Constitutional: neg  Cardiovascular: neg Ear/Nose/Throat: neg  Skin: neg Eyes: neg Respiratory: neg Gastroitestinal: neg  Hematology/Lymphatic: neg  Endocrine: neg Musculoskeletal:neg Allergy/Immunology: neg Neurological: neg Psychiatric: neg Sleep : Restless legs   ALLERGIES: No Known Allergies  HOME MEDICATIONS: Outpatient Prescriptions Prior to Visit  Medication Sig Dispense Refill  . Calcium Citrate (CITRACAL PO) Take 600 mg by mouth 2 (two) times daily.    . Cholecalciferol (VITAMIN D-3) 1000 UNITS CAPS Take 1,000 Units by mouth daily.    . clidinium-chlordiazePOXIDE (LIBRAX) 5-2.5 MG per capsule Take 1 capsule by mouth as needed. 100 capsule 3  . fluticasone (FLONASE) 50 MCG/ACT nasal spray Place 1 spray into both nostrils as needed for allergies or rhinitis.    Marland Kitchen  gabapentin (NEURONTIN) 100 MG capsule Take 1 capsule (100 mg total) by mouth 3 (three) times daily. (Patient taking differently: Take 100 mg by mouth 3 (three) times daily. Taking 100mg  po am and 200mg  po pm) 270 capsule 0  . Inulin (FIBER CHOICE FRUITY BITES PO) Take 2 tablets by mouth at bedtime.    . Misc Natural Products (OSTEO BI-FLEX ADV TRIPLE ST) TABS Take 1 tablet by mouth 2 (two) times daily.    . Multiple Vitamin (MULTIVITAMIN) tablet Take 1 tablet by mouth daily.    Marland Kitchen NEXIUM 40 MG capsule TAKE 1 CAPSULE DAILY 90 capsule 1  . Omega-3 Fatty Acids (FISH OIL) 1200 MG CAPS Take 1 capsule by mouth daily.    Marland Kitchen gabapentin (NEURONTIN) 100 MG capsule One capsule 3 times daily     No facility-administered medications prior to visit.    PAST MEDICAL HISTORY: Past Medical History  Diagnosis Date  . Breast cancer   . Esophageal stricture   . GERD (gastroesophageal reflux disease)   . Diverticulosis of colon (without mention of hemorrhage)   . Colon polyps     Hyperplastic   . Restless legs syndrome (RLS)   . Restless leg syndrome 03/27/2014  . Toe pain, right 09/2014    for 2 wks    PAST SURGICAL HISTORY: Past Surgical History  Procedure Laterality Date  . Breast surgery      left 2006  . Cesarean section      FAMILY HISTORY: Family History  Problem Relation Age of Onset  . COPD Father   . Heart disease Father   . Breast cancer Sister   .  Heart disease Maternal Grandmother   . Heart disease Paternal Grandmother   . Obesity Brother   . Heart attack Mother   . Neuropathy Neg Hx     SOCIAL HISTORY: History   Social History  . Marital Status: Married    Spouse Name: N/A  . Number of Children: 2  . Years of Education: AS   Occupational History  . RN     part-time  . Retired    Social History Main Topics  . Smoking status: Never Smoker   . Smokeless tobacco: Never Used  . Alcohol Use: No  . Drug Use: No  . Sexual Activity: Yes   Other Topics Concern  . Not  on file   Social History Narrative     PHYSICAL EXAM  Filed Vitals:   09/25/14 1521  BP: 140/82  Pulse: 82  Height: 5\' 4"  (1.626 m)  Weight: 184 lb (83.462 kg)   Body mass index is 31.57 kg/(m^2). General: The patient is alert and cooperative at the time of the examination. Skin: No significant peripheral edema is noted.   Neurologic Exam  Mental status: The patient is oriented x 3. Cranial nerves: Facial symmetry is present. Speech is normal, no aphasia or dysarthria is noted. Extraocular movements are full. Visual fields are full. Motor: The patient has good strength in all 4 extremities. No focal weakness Sensory examination: Soft touch sensation is symmetric on the face, arms, and legs. Coordination: The patient has good finger-nose-finger and heel-to-shin bilaterally. Gait and station: The patient has a normal gait. Tandem gait is normal. Romberg is negative. . Reflexes: Deep tendon reflexes are symmetric.   DIAGNOSTIC DATA (LABS, IMAGING, TESTING) -  ASSESSMENT AND PLAN  69 y.o. year old female  has a past medical history of  Restless legs syndrome (RLS); Restless leg syndrome (03/27/2014);  here to follow-up .The patient is a current patient of Dr.Willis  who is out of the office today . This note is sent to the work in doctor.     Continue gabapentin at the current dose, will refill Follow-up in 6-8 months Dennie Bible, [facility], [facility], APRN  [facility] [address] [place], [place] [address] [phone]

## 2014-09-25 NOTE — Patient Instructions (Signed)
Continue gabapentin at the current dose, will refill Follow-up in 6-8 months

## 2014-09-26 NOTE — Progress Notes (Signed)
I reviewed note and agree with plan.   Penni Bombard, MD 05/18/6429, 4:27 AM Certified in Neurology, Neurophysiology and Neuroimaging  [facility] [address], [place], [place] [address] [phone]

## 2014-10-03 ENCOUNTER — Other Ambulatory Visit: Payer: Self-pay

## 2014-10-03 DIAGNOSIS — Z1231 Encounter for screening mammogram for malignant neoplasm of breast: Secondary | ICD-10-CM

## 2014-10-19 DIAGNOSIS — G609 Hereditary and idiopathic neuropathy, unspecified: Secondary | ICD-10-CM | POA: Diagnosis not present

## 2014-10-19 DIAGNOSIS — M8580 Other specified disorders of bone density and structure, unspecified site: Secondary | ICD-10-CM | POA: Diagnosis not present

## 2014-10-19 DIAGNOSIS — N189 Chronic kidney disease, unspecified: Secondary | ICD-10-CM | POA: Diagnosis not present

## 2014-10-19 DIAGNOSIS — E559 Vitamin D deficiency, unspecified: Secondary | ICD-10-CM | POA: Diagnosis not present

## 2014-10-19 DIAGNOSIS — K635 Polyp of colon: Secondary | ICD-10-CM | POA: Diagnosis not present

## 2014-10-19 DIAGNOSIS — Z13220 Encounter for screening for lipoid disorders: Secondary | ICD-10-CM | POA: Diagnosis not present

## 2014-10-19 DIAGNOSIS — Z0000 Encounter for general adult medical examination without abnormal findings: Secondary | ICD-10-CM | POA: Diagnosis not present

## 2014-10-19 DIAGNOSIS — G629 Polyneuropathy, unspecified: Secondary | ICD-10-CM | POA: Diagnosis not present

## 2014-10-19 DIAGNOSIS — K219 Gastro-esophageal reflux disease without esophagitis: Secondary | ICD-10-CM | POA: Diagnosis not present

## 2014-10-19 DIAGNOSIS — R8290 Unspecified abnormal findings in urine: Secondary | ICD-10-CM | POA: Diagnosis not present

## 2014-10-19 DIAGNOSIS — C50912 Malignant neoplasm of unspecified site of left female breast: Secondary | ICD-10-CM | POA: Diagnosis not present

## 2014-10-22 ENCOUNTER — Other Ambulatory Visit: Payer: Self-pay | Admitting: Internal Medicine

## 2014-10-30 ENCOUNTER — Ambulatory Visit: Payer: Medicare Other

## 2014-10-30 ENCOUNTER — Other Ambulatory Visit: Payer: Self-pay

## 2014-11-13 ENCOUNTER — Ambulatory Visit
Admission: RE | Admit: 2014-11-13 | Discharge: 2014-11-13 | Disposition: A | Discharge status: Home / Self Care | Payer: Medicare Other | Source: Ambulatory Visit

## 2014-11-13 DIAGNOSIS — Z1231 Encounter for screening mammogram for malignant neoplasm of breast: Secondary | ICD-10-CM

## 2014-11-14 ENCOUNTER — Encounter: Payer: Self-pay | Admitting: Genetic Counselor

## 2014-11-20 DIAGNOSIS — Z6830 Body mass index (BMI) 30.0-30.9, adult: Secondary | ICD-10-CM | POA: Diagnosis not present

## 2014-11-20 DIAGNOSIS — M8588 Other specified disorders of bone density and structure, other site: Secondary | ICD-10-CM | POA: Diagnosis not present

## 2014-11-20 DIAGNOSIS — N950 Postmenopausal bleeding: Secondary | ICD-10-CM | POA: Diagnosis not present

## 2014-11-20 DIAGNOSIS — Z01419 Encounter for gynecological examination (general) (routine) without abnormal findings: Secondary | ICD-10-CM | POA: Diagnosis not present

## 2014-11-21 ENCOUNTER — Telehealth: Payer: Self-pay | Admitting: Genetic Counselor

## 2014-11-21 NOTE — Telephone Encounter (Signed)
Returned patient's phone message.  Discussed that genetic testing as expanded over the last 10 years, where new mutations in BRCA have been found (del/dup) and additional genes are now being tested for.  We are recontacting patients who tested negative in the past to offer updated testing.  She is concerned about cost.  We discussed that most people have $100 or less out of pocket if they meet medical criteria for testing.  She will call back to schedule an appointment.

## 2014-11-23 ENCOUNTER — Telehealth: Payer: Self-pay | Admitting: Genetic Counselor

## 2014-11-23 NOTE — Telephone Encounter (Signed)
Patient called to schedule genetic appt for 08/04 @ 1 w/karen powell

## 2014-12-07 ENCOUNTER — Ambulatory Visit (HOSPITAL_BASED_OUTPATIENT_CLINIC_OR_DEPARTMENT_OTHER): Payer: Medicare Other | Admitting: Genetic Counselor

## 2014-12-07 ENCOUNTER — Encounter: Payer: Self-pay | Admitting: Genetic Counselor

## 2014-12-07 ENCOUNTER — Other Ambulatory Visit: Payer: Medicare Other

## 2014-12-07 DIAGNOSIS — Z809 Family history of malignant neoplasm, unspecified: Secondary | ICD-10-CM

## 2014-12-07 DIAGNOSIS — Z800 Family history of malignant neoplasm of digestive organs: Secondary | ICD-10-CM

## 2014-12-07 DIAGNOSIS — Z8041 Family history of malignant neoplasm of ovary: Secondary | ICD-10-CM | POA: Diagnosis not present

## 2014-12-07 DIAGNOSIS — Z1379 Encounter for other screening for genetic and chromosomal anomalies: Secondary | ICD-10-CM | POA: Insufficient documentation

## 2014-12-07 DIAGNOSIS — Z315 Encounter for genetic counseling: Secondary | ICD-10-CM

## 2014-12-07 DIAGNOSIS — Z803 Family history of malignant neoplasm of breast: Secondary | ICD-10-CM | POA: Diagnosis not present

## 2014-12-07 DIAGNOSIS — Z853 Personal history of malignant neoplasm of breast: Secondary | ICD-10-CM

## 2014-12-07 NOTE — Progress Notes (Signed)
REFERRING PROVIDER: Lurline Del, MD Roma Kayser, MS, [place]  PRIMARY PROVIDER:  Gennette Pac, MD  PRIMARY REASON FOR VISIT:  1. History of left breast cancer   2. Family history of breast cancer in first degree relative   3. Family history of breast cancer in female   31. Family history of ovarian cancer   5. Family history of esophageal cancer      HISTORY OF PRESENT ILLNESS:   Meghan [name], a 69 y.o. female, was initially seen for a [place] cancer genetics consultation in 2006.  At that time, she had negative BRACAnalysis testing (without BART testing) through [employer] [facility].  Meghan [name] returns today following receipt of our recall letter that was sent to her to inform her of updated genetic testing options.  Meghan [name] presents to clinic today to discuss the possibility of a hereditary predisposition to cancer, updated genetic testing options, and to further clarify her future cancer risks, as well as potential cancer risks for family members, specifically for her sons and grandchildren.  In 2006, at the age of 60, Meghan [name] was diagnosed with invasive ductal carcinoma with DCIS of the left breast.   Hormone receptor status was ER/PR+, Her2-, with a Ki67 of 17%.  This was treated with lumpectomy, radiation, and antiestrogen therapy.  She has had no additional cancers since that time.  There is also a family history of breast cancer in her sister and in the maternal family.   CANCER HISTORY:   No history exists.  2006 - IDC + DCIS of left breast; ER/PR+, Her2-; Ki67=17%   HORMONAL RISK FACTORS:  Menarche was at age 65.  First live birth at age 52.  OCP use for approximately approximately 20 years.  Ovaries intact: yes.  Hysterectomy: no.  Menopausal status: postmenopausal.  HRT use: 0 years; took antiestrogen therapy following her breast diagnosis Colonoscopy: yes; history of approx three total hyperplastic polyps - one hyperplastic polyp on most  recent colonoscopy in 2011 and two prior to that in 2000. Mammogram within the last year: yes. Number of breast biopsies: 1. Up to date with pelvic exams:  yes. Any excessive radiation exposure in the past:  no  Past Medical History  Diagnosis Date  . Esophageal stricture   . GERD (gastroesophageal reflux disease)   . Diverticulosis of colon (without mention of hemorrhage)   . Colon polyps     Hyperplastic   . Restless legs syndrome (RLS)   . Restless leg syndrome 03/27/2014  . Toe pain, right 09/2014    for 2 wks  . Breast cancer 2006    IDC+DCIS of Left breast; ER/PR+, Her2-; Ki67=17%    Past Surgical History  Procedure Laterality Date  . Breast surgery      left 2006  . Cesarean section      History   Social History  . Marital Status: Married    Spouse Name: N/A  . Number of Children: 2  . Years of Education: AS   Occupational History  . RN     part-time  . Retired    Social History Main Topics  . Smoking status: Never Smoker   . Smokeless tobacco: Never Used  . Alcohol Use: Yes     Comment: very rarely - wine  . Drug Use: No  . Sexual Activity: Yes   Other Topics Concern  . None   Social History Narrative     FAMILY HISTORY:  We obtained a detailed, 4-generation family history.  Significant diagnoses are listed below: Family History  Problem Relation Age of Onset  . COPD Father   . Heart disease Father   . Emphysema Father     smoker  . Breast cancer Sister 1    mastectomy; metastasis to liver and lung  . Heart disease Maternal Grandmother   . Congestive Heart Failure Maternal Grandmother   . Heart disease Paternal Grandmother   . Obesity Brother   . Heart attack Mother   . Neuropathy Neg Hx   . Breast cancer Maternal Aunt     dx. late 30s  . Alzheimer's disease Maternal Aunt   . Breast cancer Maternal Aunt     dx. 13s  . Squamous cell carcinoma Maternal Aunt     recently removed; dx. 95  . Breast cancer Cousin 62    genetic testing  in approx 2015  . Ovarian cancer Cousin     dx. 46s  . Esophageal cancer Cousin     not a smoker    Meghan [name] has two twin sons, ages 42.  She currently has two grandsons and two granddaughters.  She has one full sister who died from metastatic breast cancer around the age of 22-55.  This sister had two sons with whom Meghan [name] has no contact.  Meghan [name] also has one full brother who is alive and cancer-free at 35.  This brother has one daughter, age 29.  Meghan [name] mother died at 45 following a heart attack.  Her father also died in his 47s from emphysema and a heart attack.  Both of her parents were smokers.    There is a maternal family history of breast cancer in two of eight maternal aunts.  These aunts were diagnosed in their 16s and late 49s.  The aunt diagnosed in her 33s is still living at 110 and has a recent diagnosis of squamous cell carcinoma.  There is also a history of cancer in three maternal cousins.  One cousin (daughter of one of the aunts with breast cancer) died of ovarian cancer in her 94s.  Another first cousin was diagnosed with breast cancer at 20.  This cousin has had genetic testing within the last year or year and a half.  Meghan [name] is unsure of the result, but knows that her sister was tested here in [place].  Another first cousin, a non-smoker, was diagnosed with esophageal cancer and died in his 61s.  Meghan [name] maternal grandmother died of congestive heart failure in her 44s; her grandfather died at 32 and did not have cancer.  Meghan [name] father had one full brother and sister.  Both died in their 39s and both had children.  Meghan [name] is not in contact with many of these family members.  Her paternal grandparents have passed away, but she has no information for them.  Patient's maternal ancestors are of Caucasian descent, and paternal ancestors are of Caucasian/English descent. There is no reported Ashkenazi Jewish ancestry. There is no known  consanguinity.  GENETIC COUNSELING ASSESSMENT: [name] is a 69 y.o. female with a personal and family history of cancer which is somewhat suggestive of a hereditary breast/ovarian cancer syndrome and predisposition to cancer. We, therefore, discussed and recommended the following at today's visit.   DISCUSSION: We reviewed the characteristics, features and inheritance patterns of hereditary cancer syndromes, particularly those caused by changes within the BRCA1/2 genes and other genes we have newly discovered to have an association with increased risks for  breast and ovarian cancer. We also discussed genetic testing, including the appropriate family members to test, the process of testing, insurance coverage and turn-around-time for results. We discussed the implications of a negative, positive and/or variant of uncertain significant result. We recommended Ms. Wolaver pursue genetic testing for the 20-gene Breast/Ovarian Cancer Panel through [facility] Hope Pigeon, MD).  The Breast/Ovarian gene panel offered by [facility] includes sequencing and deletion/duplication analysis for the following 19 genes:  ATM, BARD1, BRCA1, BRCA2, BRIP1, CDH1, CHEK2, FANCC, MLH1, MSH2, MSH6, NBN, PALB2, PMS2, PTEN, RAD51C, RAD51D, TP53, and XRCC2.  This panel also includes deletion/duplication analysis (without sequencing) for one gene, EPCAM.  Based on Ms. Smet's personal and family history of cancer, she meets medical criteria for genetic testing. Despite that she meets criteria, she may still have an out of pocket cost. We discussed that if her out of pocket cost for testing is over $100, the laboratory will call and confirm whether she wants to proceed with testing.  If the out of pocket cost of testing is less than $100 she will be billed by the genetic testing laboratory.   PLAN: After considering the risks, benefits, and limitations, Ms. Krizek  provided informed consent to pursue genetic testing and  the blood sample was sent to [facility] for analysis of the 20-gene Breast/Ovarian Cancer Panel test. Results should be available within approximately 2-3 weeks' time, at which point they will be disclosed by telephone to Ms. Raynes, as will any additional recommendations warranted by these results. Ms. Schreiter will receive a summary of her genetic counseling visit and a copy of her results once available. This information will also be available in Epic. We encouraged Ms. Bui to remain in contact with cancer genetics annually so that we can continuously update the family history and inform her of any changes in cancer genetics and testing that may be of benefit for her family. Ms. Eng questions were answered to her satisfaction today. Our contact information was provided should additional questions or concerns arise.  Thank you for the referral and allowing [place] to share in the care of your patient.   Jeanine Luz, MS Genetic Counselor [email or web address] Phone: [phone]  The patient was seen for a total of 60 minutes in face-to-face genetic counseling.  This patient was discussed with Drs. Magrinat, Lindi Adie and/or Burr Medico who agrees with the above.    _______________________________________________________________________ For Office Staff:  Number of people involved in session: 1 Was an Intern/ student involved with case: no

## 2014-12-08 ENCOUNTER — Encounter: Payer: Self-pay | Admitting: Internal Medicine

## 2014-12-08 ENCOUNTER — Ambulatory Visit (INDEPENDENT_AMBULATORY_CARE_PROVIDER_SITE_OTHER): Payer: Medicare Other | Admitting: Internal Medicine

## 2014-12-08 VITALS — BP 116/62 | HR 72 | Ht 62.75 in | Wt 171.4 lb

## 2014-12-08 DIAGNOSIS — K222 Esophageal obstruction: Secondary | ICD-10-CM

## 2014-12-08 DIAGNOSIS — K2100 Gastro-esophageal reflux disease with esophagitis, without bleeding: Secondary | ICD-10-CM

## 2014-12-08 MED ORDER — CILIDINIUM-CHLORDIAZEPOXIDE 2.5-5 MG PO CAPS: 1.0000 | ORAL_CAPSULE | ORAL | Status: DC | PRN

## 2014-12-08 NOTE — Patient Instructions (Signed)
We have sent the following medications to your pharmacy for you to pick up at your convenience:  Librax  Please follow up in one year

## 2014-12-08 NOTE — Progress Notes (Signed)
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 Patient has a history of GERD complicated by peptic stricture requiring esophageal dilation by Dr. Sharlett Iles March 2007. I last saw the patient Sep 09 2013. At that time she was asymptomatic post dilation on PPI. We did discuss potential concerns regarding chronic use of PPI therapy. No change in therapy recommended with one-year follow-up planned. The patient did undergo routine screening colonoscopy December 2011. She had a hyperplastic colon polyp with 10 year follow-up recommended. The patient's GI systems review is entirely negative. No breakthrough heartburn, recurrent dysphagia, or lower GI complaints. She is on magnesium supplement for the reasons. She has had a history of irritable bowel type complaints for which she takes Librax periodically. She requests a refill of Librax and Nexium. She does have other questions regarding chronic Nexium use including potential effects on any function. The patient does bring with her multiple laboratories. CBC was unremarkable. Hemoglobin 12.2. She states this is less than previous. Normal MCV. She donates blood multiple times per year. She is not on iron supplements. Other laboratories, normal comprehensive metabolic panel. She does report her GFR 54 with greater than 60 being normal.  REVIEW OF SYSTEMS:  All non-GI ROS negative except for muscle cramps  Past Medical History  Diagnosis Date  . Esophageal stricture   . GERD (gastroesophageal reflux disease)   . Diverticulosis of colon (without mention of hemorrhage)   . Colon polyps     Hyperplastic   . Restless legs syndrome (RLS)   . Restless leg syndrome 03/27/2014  . Toe pain, right 09/2014    for 2 wks  . Breast cancer 2006    IDC+DCIS of Left breast; ER/PR+, Her2-; Ki67=17%    Past Surgical History  Procedure Laterality Date  . Breast  surgery      left 2006  . Cesarean section      Social History Meghan [name]  reports that she has never smoked. She has never used smokeless tobacco. She reports that she drinks alcohol. She reports that she does not use illicit drugs.  family history includes Alzheimer's disease in her maternal aunt; Breast cancer in her maternal aunt and maternal aunt; Breast cancer (age of onset: 58) in her sister; Breast cancer (age of onset: 57) in her cousin; COPD in her father; Congestive Heart Failure in her maternal grandmother; Emphysema in her father; Esophageal cancer in her cousin; Heart attack in her mother; Heart disease in her father, maternal grandmother, and paternal grandmother; Obesity in her brother; Ovarian cancer in her cousin; Squamous cell carcinoma in her maternal aunt. There is no history of Neuropathy.  No Known Allergies     PHYSICAL EXAMINATION: Vital signs: BP 116/62 mmHg  Pulse 72  Ht 5' 2.75" (1.594 m)  Wt 171 lb 6 oz (77.735 kg)  BMI 30.59 kg/m2 General: Well-developed, well-nourished, no acute distress HEENT: Sclerae are anicteric, conjunctiva pink. Oral mucosa intact Lungs: Clear Heart: Regular Abdomen: soft, nontender, nondistended, no obvious ascites, no peritoneal signs, normal bowel sounds. No organomegaly. Extremities: No edema Psychiatric: alert and oriented x3. Cooperative   ASSESSMENT:  #1. GERD, complicated by peptic stricture requiring esophageal dilation. The patient remains astigmatic post dilation on PPI #2. Multiple questions regarding chronic PPI use. Reviewed #3. History of occasional IBS symptoms. Relieved with Librax #4. Normal hemoglobin with mild drift. Chronic blood donor #5. Colon cancer screening. Up-to-date  PLAN:  #1. Continue reflux precautions #2. Continue omeprazole. Refilled #3. Continue Librax as needed. Refilled #4. Recommended iron supplementation for chronic blood donation to avoid anemia #5. Routine screening  colonoscopy around 2021

## 2014-12-26 ENCOUNTER — Telehealth: Payer: Self-pay | Admitting: Genetic Counselor

## 2014-12-26 NOTE — Telephone Encounter (Signed)
Discussed with Meghan [name] that her genetic test results were negative for mutations within any of 20 genes that would cause her to be at an increased risk for breast and ovarian cancer.  Additionally, no uncertain changes were found.  We still do not have an explanation for her personal or for the family history of breast cancer, but most cancers are not genetics.  She is always welcome to call us back in the future to check on updated testing options.  At this point in time, however, she has had the most updated genetic testing available, as has her maternal first cousin, Meghan [name].  Thus, there is no one else in the family we would consider testing at this time.  Still, women in the family are considered to be at an increased risk for breast cancer just based on the family history.  Women should begin annual mammogram screening by the age of 62; this includes Meghan [name] who will need to begin thinking about screening soon if she has not already.  Meghan [name] is welcome to call us with any further questions and to update the family history.

## 2014-12-28 ENCOUNTER — Ambulatory Visit: Payer: Self-pay | Admitting: Genetic Counselor

## 2014-12-28 ENCOUNTER — Telehealth: Payer: Self-pay | Admitting: Genetic Counselor

## 2014-12-28 DIAGNOSIS — N189 Chronic kidney disease, unspecified: Secondary | ICD-10-CM | POA: Diagnosis not present

## 2014-12-28 DIAGNOSIS — Z8041 Family history of malignant neoplasm of ovary: Secondary | ICD-10-CM | POA: Insufficient documentation

## 2014-12-28 DIAGNOSIS — Z853 Personal history of malignant neoplasm of breast: Secondary | ICD-10-CM

## 2014-12-28 DIAGNOSIS — Z803 Family history of malignant neoplasm of breast: Secondary | ICD-10-CM | POA: Insufficient documentation

## 2014-12-28 DIAGNOSIS — Z1379 Encounter for other screening for genetic and chromosomal anomalies: Secondary | ICD-10-CM

## 2014-12-28 NOTE — Telephone Encounter (Signed)
Meghan [name] called to request a copy of her genetic test results.  I am happy to send that to her in a secure email.  She gave me the best email address to send her results to.

## 2014-12-28 NOTE — Progress Notes (Signed)
GENETIC TEST RESULTS  HPI: [name] was previously seen in the [facility] clinic following receipt of our recall letter regarding updated genetic testing options, as well as for her personal and family history of cancer and concerns regarding a hereditary predisposition to cancer. Please refer to our prior cancer genetics clinic note from December 07, 2014 for more information regarding [name]'s medical, social and family histories, and our assessment and recommendations, at the time. [name] recent genetic test results were disclosed to her, as were recommendations warranted by these results. These results and recommendations are discussed in more detail below.  GENETIC TEST RESULTS: At the time of [name]'s visit on 12/07/14, we recommended she pursue genetic testing of the 20-gene Breast/Ovarian Cancer Panel through [facility] Hope Pigeon, MD).  The Breast/Ovarian gene panel offered by [facility] includes sequencing and deletion/duplication analysis for the following 19 genes:  ATM, BARD1, BRCA1, BRCA2, BRIP1, CDH1, CHEK2, FANCC, MLH1, MSH2, MSH6, NBN, PALB2, PMS2, PTEN, RAD51C, RAD51D, TP53, and XRCC2.  This panel also includes deletion/duplication analysis (without sequencing) for one gene, EPCAM.  Those results are now back, the report date for which is December 22, 2014.  Genetic testing was normal, and did not reveal a deleterious mutation in these genes.  Additionally, no variants of uncertain significance (VUSs) were found. The test report will be scanned into EPIC and will be located under the Results Review tab in the Surgical Pathology>Molecular Pathology section.   We discussed with [name] that since the current genetic testing is not perfect, it is possible there may be a gene mutation in one of these genes that current testing cannot detect, but that chance is small. We also discussed, that it is possible that another gene that has not yet been  discovered, or that we have not yet tested, is responsible for the cancer diagnoses in the family, and it is, therefore, important to remain in touch with cancer genetics in the future so that we can continue to offer [name] the most up to date genetic testing.   CANCER SCREENING RECOMMENDATIONS: Though we still do not have an explanation for the personal and family history of breast cancer, this result is reassuring and indicates that [name] likely does not have an increased risk for a future cancer due to a mutation in one of these genes. This normal test also suggests that [name] cancer was most likely not due to an inherited predisposition associated with one of these genes.  Most cancers happen by chance and this negative test suggests that her cancer falls into this category.  We, therefore, recommended she continue to follow the cancer management and screening guidelines provided by her oncology and primary healthcare providers.   RECOMMENDATIONS FOR FAMILY MEMBERS: Women in this family might be at some increased risk of developing cancer, over the general population risk, simply due to the family history of cancer. We recommended women in this family have a yearly mammogram beginning at age 16, or 63 years younger than the earliest onset of cancer, an an annual clinical breast exam, and perform monthly breast self-exams. This includes [name]'s niece, who is currently 38.  Women in this family should also have a gynecological exam as recommended by their primary provider. All family members should have a colonoscopy by age 17.  [name] reported that her maternal first cousin who was diagnosed with breast cancer at 78 and whose mother had breast cancer which was diagnosed in  her 5s, has had recent negative genetic testing.  Thus, this is probably even more reassuring for [place] that [name] and her family members truly have had the most comprehensive testing at this time.  At  this point in time, there is no one else we would think to recommend for genetic testing.  FOLLOW-UP: Lastly, we discussed with [name] that cancer genetics is a rapidly advancing field and it is possible that new genetic tests will be appropriate for her and/or her family members in the future. We encouraged her to remain in contact with cancer genetics on an annual basis so we can update her personal and family histories and let her know of advances in cancer genetics that may benefit this family.   Our contact number was provided. [name] questions were answered to her satisfaction, and she knows she is welcome to call us at anytime with additional questions or concerns.   Jeanine Luz MS Genetic Counselor [email or web address] Phone: [phone]

## 2015-01-11 ENCOUNTER — Encounter (HOSPITAL_COMMUNITY): Payer: Self-pay

## 2015-02-02 ENCOUNTER — Other Ambulatory Visit: Payer: Self-pay | Admitting: Internal Medicine

## 2015-02-27 DIAGNOSIS — H43813 Vitreous degeneration, bilateral: Secondary | ICD-10-CM | POA: Diagnosis not present

## 2015-02-27 DIAGNOSIS — H5213 Myopia, bilateral: Secondary | ICD-10-CM | POA: Diagnosis not present

## 2015-02-27 DIAGNOSIS — H52203 Unspecified astigmatism, bilateral: Secondary | ICD-10-CM | POA: Diagnosis not present

## 2015-03-27 ENCOUNTER — Encounter (HOSPITAL_COMMUNITY): Payer: Self-pay

## 2015-04-03 ENCOUNTER — Ambulatory Visit (INDEPENDENT_AMBULATORY_CARE_PROVIDER_SITE_OTHER): Payer: Medicare Other | Admitting: Nurse Practitioner

## 2015-04-03 ENCOUNTER — Encounter: Payer: Self-pay | Admitting: Nurse Practitioner

## 2015-04-03 VITALS — BP 134/86 | HR 84 | Ht 64.0 in | Wt 175.0 lb

## 2015-04-03 DIAGNOSIS — G2581 Restless legs syndrome: Secondary | ICD-10-CM

## 2015-04-03 DIAGNOSIS — R209 Unspecified disturbances of skin sensation: Secondary | ICD-10-CM | POA: Diagnosis not present

## 2015-04-03 MED ORDER — GABAPENTIN 100 MG PO CAPS: ORAL_CAPSULE | ORAL | Status: DC

## 2015-04-03 NOTE — Progress Notes (Signed)
[facility]  PATIENT: NICHOLETTE DOLSON DOB: [birth year]   REASON FOR VISIT: Follow-up for restless leg syndrome, disturbance of skin HISTORY FROM: Patient    HISTORY OF PRESENT ILLNESS:Ms. Broman is a 69 year old right-handed white female with a history of onset of numbness and tingling sensations on all 4 extremities. Workup did not show evidence of a peripheral neuropathy, but a small fiber neuropathy cannot be excluded. Blood work was relatively unremarkable, an MRI of the cervical spine did not show spinal cord impingement or evidence of demyelinating disease. The patient indicates that the numbness has essentially gone away, but she has a restless legs type syndrome. The patient has been placed on low-dose gabapentin,  taking 300 mg twice daily however becoming drowsy on this dose The patient is sleeping well at this time, without any further complaints. The patient returns to this office for re evaluation. She reports no other new medical issues that have come up since last seen.    REVIEW OF SYSTEMS: Full 14 system review of systems performed and notable only for those listed, all others are neg:  Constitutional: neg  Cardiovascular: neg Ear/Nose/Throat: neg  Skin: neg Eyes: neg Respiratory: neg Gastroitestinal: neg  Hematology/Lymphatic: neg  Endocrine: neg Musculoskeletal:neg Allergy/Immunology: neg Neurological: neg Psychiatric: neg Sleep :  Restless leg syndrome  ALLERGIES: No Known Allergies  HOME MEDICATIONS: Outpatient Prescriptions Prior to Visit  Medication Sig Dispense Refill  . Calcium Citrate (CITRACAL PO) Take 600 mg by mouth 2 (two) times daily.    . Cholecalciferol (VITAMIN D-3) 1000 UNITS CAPS Take 2,000 Units by mouth daily.     . clidinium-chlordiazePOXIDE (LIBRAX) 5-2.5 MG per capsule Take 1 capsule by mouth as needed. 100 capsule 3  . FIBER SELECT GUMMIES PO Take by mouth.    . fluticasone (FLONASE) 50 MCG/ACT nasal spray Place  1 spray into both nostrils as needed for allergies or rhinitis.    Marland Kitchen gabapentin (NEURONTIN) 100 MG capsule Take 1 capsule (100 mg total) by mouth 3 (three) times daily. Taking 171m po am and 2011mpo pm (Patient taking differently: Take 300 mg by mouth at bedtime. Taking 10066mo am and 200m62m pm) 270 capsule 2  . Magnesium 250 MG TABS Take 250 mg by mouth 1 day or 1 dose.    . Misc Natural Products (OSTEO BI-FLEX ADV TRIPLE ST) TABS Take 1 tablet by mouth 2 (two) times daily.    . Multiple Vitamin (MULTIVITAMIN) tablet Take 1 tablet by mouth daily.    . NEMarland KitchenIUM 40 MG capsule TAKE 1 CAPSULE DAILY 90 capsule 1  . Omega-3 Fatty Acids (FISH OIL) 1200 MG CAPS Take 1 capsule by mouth daily.     No facility-administered medications prior to visit.    PAST MEDICAL HISTORY: Past Medical History  Diagnosis Date  . Esophageal stricture   . GERD (gastroesophageal reflux disease)   . Diverticulosis of colon (without mention of hemorrhage)   . Colon polyps     Hyperplastic   . Restless legs syndrome (RLS)   . Restless leg syndrome 03/27/2014  . Toe pain, right 09/2014    for 2 wks  . 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 Breast surgery      left 2006  . Cesarean section      FAMILY HISTORY: Family History  Problem Relation Age of Onset  .  COPD Father   . Heart disease Father   . Emphysema Father     smoker  . Breast cancer Sister 63    mastectomy; metastasis to liver and lung  . Heart disease Maternal Grandmother   . Congestive Heart Failure Maternal Grandmother   . Heart disease Paternal Grandmother   . Obesity Brother   . Heart attack Mother   . Neuropathy Neg Hx   . Breast cancer Maternal Aunt     dx. late 67s  . Alzheimer's disease Maternal Aunt   . Breast cancer Maternal Aunt     dx. 76s  . Squamous cell carcinoma Maternal Aunt     recently removed; dx.  95  . Breast cancer Cousin 62    genetic testing in approx 2015  . Ovarian cancer Cousin     dx. 67s  . Esophageal cancer Cousin     not a smoker    SOCIAL HISTORY: Social History   Social History  . Marital Status: Married    Spouse Name: N/A  . Number of Children: 2  . Years of Education: AS   Occupational History  . RN     part-time  . Retired    Social History Main Topics  . Smoking status: Never Smoker   . Smokeless tobacco: Never Used  . Alcohol Use: Yes     Comment: very rarely - wine  . Drug Use: No  . Sexual Activity: Yes   Other Topics Concern  . Not on file   Social History Narrative     PHYSICAL EXAM  Filed Vitals:   04/03/15 0852  BP: 134/86  Pulse: 84  Height: 5' 4" (1.626 m)  Weight: 175 lb (79.379 kg)   Body mass index is 30.02 kg/(m^2). General: The patient is alert and cooperative at the time of the examination. Skin: No significant peripheral edema is noted. Neurologic Exam Mental status: The patient is oriented x 3. Cranial nerves: Facial symmetry is present. Speech is normal, no aphasia or dysarthria is noted. Extraocular movements are full. Visual fields are full. Motor: The patient has good strength in all 4 extremities. No focal weakness Sensory examination: Soft touch , pinprick and vibratory sensation is symmetric on the  arms, and legs. Coordination: The patient has good finger-nose-finger and heel-to-shin bilaterally. Gait and station: The patient has a normal gait. Tandem gait is normal. Romberg is negative. . Reflexes: Deep tendon reflexes are symmetric.   DIAGNOSTIC DATA (LABS, IMAGING, TESTING) -  ASSESSMENT AND PLAN  69 y.o. year old female  has a past medical history of Restless legs syndrome (RLS); Toe pain, right (09/2014);  here to follow-up.  Change gabapentin to 100 mg 3 times daily and 300 mg at night will reorder Follow-up in 6 months next visit with Dr. Jerline Pain, [facility], [facility], APRN  [facility] [address], [place], [place] [address] [phone]

## 2015-04-03 NOTE — Progress Notes (Signed)
I have read the note, and I agree with the clinical assessment and plan.  WILLIS,CHARLES KEITH   

## 2015-04-03 NOTE — Patient Instructions (Addendum)
Change gabapentin to 100 mg 3 times daily and 300 mg at night will reorder Follow-up in 6 months next visit with Dr. Jannifer Franklin

## 2015-05-10 DIAGNOSIS — L820 Inflamed seborrheic keratosis: Secondary | ICD-10-CM | POA: Diagnosis not present

## 2015-07-30 ENCOUNTER — Other Ambulatory Visit: Payer: Self-pay | Admitting: Internal Medicine

## 2015-08-08 ENCOUNTER — Telehealth: Payer: Self-pay | Admitting: Internal Medicine

## 2015-08-08 MED ORDER — CIPROFLOXACIN HCL 500 MG PO TABS: 500.0000 mg | ORAL_TABLET | Freq: Two times a day (BID) | ORAL | Status: DC

## 2015-08-08 NOTE — Addendum Note (Signed)
Addended by: Marlon Pel on: 08/08/2015 02:17 PM   Modules accepted: Orders

## 2015-08-08 NOTE — Telephone Encounter (Signed)
I left a message for the patient to call back. I am asking her to give me her symptoms, durations of her symptoms and to confirm any drug allergies.

## 2015-08-08 NOTE — Telephone Encounter (Signed)
Left message for patient to call back  

## 2015-08-08 NOTE — Telephone Encounter (Signed)
Patient notified rx cancelled with [facility] and sent to [facility]

## 2015-08-08 NOTE — Telephone Encounter (Signed)
Left message for patient to call back rx sent Follow up scheduled for 08/14/15 2:00

## 2015-08-08 NOTE — Telephone Encounter (Signed)
Patient reports abdominal pain mostly left lower abdomen that started Monday. Now pressure and tenderness across the lower abdomen. She does not note any BM changes like diarrhea or constipation. Just a sensation of bloating and tenderness. States this is exactly how diverticulitis starts with her. Flagyl makes her sick. Otherwise she does not have any drug allergies. There are no APP or MD openings. Please advise.

## 2015-08-08 NOTE — Telephone Encounter (Signed)
Begin ciprofloxacin 500 mg twice daily for 7 days; #14; no refills. Have her come see me in the office next Tuesday or Wednesday at 2 PM (this would be an add-on). Thanks

## 2015-08-08 NOTE — Telephone Encounter (Signed)
Patient returned phone call. Best # [phone]

## 2015-08-14 ENCOUNTER — Encounter: Payer: Self-pay | Admitting: Internal Medicine

## 2015-08-14 ENCOUNTER — Ambulatory Visit (INDEPENDENT_AMBULATORY_CARE_PROVIDER_SITE_OTHER): Payer: Medicare Other | Admitting: Internal Medicine

## 2015-08-14 VITALS — BP 128/78 | HR 88 | Ht 64.0 in | Wt 182.6 lb

## 2015-08-14 DIAGNOSIS — K589 Irritable bowel syndrome without diarrhea: Secondary | ICD-10-CM | POA: Diagnosis not present

## 2015-08-14 DIAGNOSIS — K2100 Gastro-esophageal reflux disease with esophagitis, without bleeding: Secondary | ICD-10-CM

## 2015-08-14 DIAGNOSIS — K222 Esophageal obstruction: Secondary | ICD-10-CM

## 2015-08-14 DIAGNOSIS — K5732 Diverticulitis of large intestine without perforation or abscess without bleeding: Secondary | ICD-10-CM

## 2015-08-14 MED ORDER — CIPROFLOXACIN HCL 500 MG PO TABS: 500.0000 mg | ORAL_TABLET | Freq: Two times a day (BID) | ORAL | Status: DC

## 2015-08-14 NOTE — Progress Notes (Signed)
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 Patient also has a history of GERD complicated by peptic stricture requiring esophageal dilation in 2007. Her last screening colonoscopy in December 2011 revealed hyperplastic polyp and severe diverticular disease with sigmoid stenosis. Patient was last seen in this office 12/08/2014 to establish. See that dictation. She was in her usual state of good health until 9 days ago when she developed left lower quadrant abdominal discomfort. He took Librax without relief. The pain progressed over the next several days was consistent with a remote bout of diverticulitis. She contacted the office and was placed on ciprofloxacin 5 mg twice daily. He presents today for scheduled follow-up one week later. She reports significant improvement in her pain. No fevers. No other complaints. She continues on PPI for GERD. No recurrent dysphagia or active symptoms. I reviewed her previous colonoscopy findings with her.  REVIEW OF SYSTEMS:  All non-GI ROS negative upon review  Past Medical History  Diagnosis Date  . Esophageal stricture   . GERD (gastroesophageal reflux disease)   . Diverticulosis of colon (without mention of hemorrhage)   . Colon polyps     Hyperplastic   . Restless legs syndrome (RLS)   . Restless leg syndrome 03/27/2014  . Toe pain, right 09/2014    for 2 wks  . 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 Breast surgery      left 2006  . Cesarean section      Social History Meghan [name]  reports that she has never smoked. She has never used smokeless tobacco. She reports that she drinks alcohol. She reports that she does not use illicit drugs.  family history includes Alzheimer's disease in  her maternal aunt; Breast cancer in her maternal aunt and maternal aunt; Breast cancer (age of onset: 19) in her sister; Breast cancer (age of onset: 74) in her cousin; COPD in her father; Congestive Heart Failure in her maternal grandmother; Emphysema in her father; Esophageal cancer in her cousin; Heart attack in her mother; Heart disease in her father, maternal grandmother, and paternal grandmother; Obesity in her brother; Ovarian cancer in her cousin; Squamous cell carcinoma in her maternal aunt. There is no history of Neuropathy.  No Known Allergies     PHYSICAL EXAMINATION: Vital signs: BP 128/78 mmHg  Pulse 88  Ht 5' 4" (1.626 m)  Wt 182 lb 9.6 oz (82.827 kg)  BMI 31.33 kg/m2  Constitutional: generally well-appearing, no acute distress Psychiatric: alert and oriented x3, cooperative Eyes: extraocular movements intact, anicteric, conjunctiva pink Mouth: oral pharynx moist, no lesions Neck: supple no lymphadenopathy Cardiovascular: heart regular rate and rhythm, no murmur Lungs: clear to auscultation bilaterally Abdomen: soft, Mild tenderness with deep palpation of left lower quadrant, nondistended, no obvious ascites, no peritoneal signs, normal bowel sounds, no organomegaly Rectal: Omitted clubbing cyanosis or Extremities: no lower extremity edema bilaterally Skin: no lesions on visible extremities Neuro: No focal deficits.   ASSESSMENT:  #1. Acute diverticulitis. Improved after 6 days of antibiotic therapy. Still with mild discomfort #2. Colonoscopy 2011 as described #3. GERD, complicated by peptic stricture. Asymptomatic post dilation (2007) on PPI #4. History of IBS. Uses Librax on demand, infrequently  PLAN:  #1. Prescribed additional  course of ciprofloxacin 500 mg twice daily to take until pain completely resolved #2. Several refills of ciprofloxacin provided should she have an attack off hours. If she starts antibiotic, she has been asked to contact the office for  documentation and follow-up plans #3. Screening colonoscopy 2021 #4. Routine GI follow-up 2 years

## 2015-08-14 NOTE — Patient Instructions (Signed)
We have sent the following medications to your pharmacy for you to pick up at your convenience:  Cipro

## 2015-08-16 ENCOUNTER — Emergency Department (HOSPITAL_BASED_OUTPATIENT_CLINIC_OR_DEPARTMENT_OTHER): Payer: Medicare Other

## 2015-08-16 ENCOUNTER — Emergency Department (HOSPITAL_COMMUNITY): Payer: Medicare Other

## 2015-08-16 ENCOUNTER — Emergency Department (HOSPITAL_BASED_OUTPATIENT_CLINIC_OR_DEPARTMENT_OTHER)
Admission: EM | Admit: 2015-08-16 | Discharge: 2015-08-16 | Disposition: A | Discharge status: Other Acute Inpatient Hospital | Payer: Medicare Other | Attending: Emergency Medicine | Admitting: Emergency Medicine

## 2015-08-16 ENCOUNTER — Encounter (HOSPITAL_BASED_OUTPATIENT_CLINIC_OR_DEPARTMENT_OTHER): Payer: Self-pay

## 2015-08-16 DIAGNOSIS — Z853 Personal history of malignant neoplasm of breast: Secondary | ICD-10-CM | POA: Insufficient documentation

## 2015-08-16 DIAGNOSIS — S99912A Unspecified injury of left ankle, initial encounter: Secondary | ICD-10-CM | POA: Diagnosis present

## 2015-08-16 DIAGNOSIS — Y998 Other external cause status: Secondary | ICD-10-CM | POA: Insufficient documentation

## 2015-08-16 DIAGNOSIS — S82852A Displaced trimalleolar fracture of left lower leg, initial encounter for closed fracture: Secondary | ICD-10-CM | POA: Diagnosis not present

## 2015-08-16 DIAGNOSIS — S9302XA Subluxation of left ankle joint, initial encounter: Secondary | ICD-10-CM | POA: Diagnosis not present

## 2015-08-16 DIAGNOSIS — S82899A Other fracture of unspecified lower leg, initial encounter for closed fracture: Secondary | ICD-10-CM

## 2015-08-16 DIAGNOSIS — S9305XA Dislocation of left ankle joint, initial encounter: Secondary | ICD-10-CM | POA: Insufficient documentation

## 2015-08-16 DIAGNOSIS — Y9301 Activity, walking, marching and hiking: Secondary | ICD-10-CM | POA: Insufficient documentation

## 2015-08-16 DIAGNOSIS — Y92008 Other place in unspecified non-institutional (private) residence as the place of occurrence of the external cause: Secondary | ICD-10-CM | POA: Insufficient documentation

## 2015-08-16 DIAGNOSIS — M25579 Pain in unspecified ankle and joints of unspecified foot: Secondary | ICD-10-CM

## 2015-08-16 DIAGNOSIS — M25572 Pain in left ankle and joints of left foot: Secondary | ICD-10-CM | POA: Diagnosis not present

## 2015-08-16 DIAGNOSIS — W108XXA Fall (on) (from) other stairs and steps, initial encounter: Secondary | ICD-10-CM | POA: Insufficient documentation

## 2015-08-16 DIAGNOSIS — S9301XA Subluxation of right ankle joint, initial encounter: Secondary | ICD-10-CM | POA: Diagnosis not present

## 2015-08-16 DIAGNOSIS — R52 Pain, unspecified: Secondary | ICD-10-CM

## 2015-08-16 MED ORDER — PROPOFOL 10 MG/ML IV BOLUS
INTRAVENOUS | Status: AC | PRN
  Administered 2015-08-16: 40 mg via INTRAVENOUS

## 2015-08-16 MED ORDER — PROPOFOL 10 MG/ML IV BOLUS
0.5000 mg/kg | Freq: Once | INTRAVENOUS | Status: AC
  Administered 2015-08-16: 20 mg via INTRAVENOUS
  Filled 2015-08-16: qty 20

## 2015-08-16 MED ORDER — FENTANYL CITRATE (PF) 100 MCG/2ML IJ SOLN
100.0000 ug | Freq: Once | INTRAMUSCULAR | Status: AC
  Administered 2015-08-16: 100 ug via INTRAVENOUS
  Filled 2015-08-16: qty 2

## 2015-08-16 MED ORDER — FENTANYL CITRATE (PF) 100 MCG/2ML IJ SOLN
INTRAMUSCULAR | Status: AC | PRN
Start: 2015-08-16 — End: 2015-08-16
  Administered 2015-08-16 (×2): 25 ug via INTRAVENOUS

## 2015-08-16 MED ORDER — HYDROMORPHONE HCL 1 MG/ML IJ SOLN
INTRAMUSCULAR | Status: AC
  Filled 2015-08-16: qty 1

## 2015-08-16 MED ORDER — HYDROMORPHONE HCL 1 MG/ML IJ SOLN
0.5000 mg | Freq: Once | INTRAMUSCULAR | Status: AC
  Administered 2015-08-16: 0.5 mg via INTRAVENOUS

## 2015-08-16 MED ORDER — PROPOFOL 10 MG/ML IV BOLUS
120.0000 mg | Freq: Once | INTRAVENOUS | Status: AC
Start: 2015-08-16 — End: 2015-08-16
  Administered 2015-08-16: 80 mg via INTRAVENOUS
  Filled 2015-08-16: qty 20

## 2015-08-16 MED ORDER — OXYCODONE HCL 5 MG PO TABS: 5.0000 mg | ORAL_TABLET | ORAL | Status: DC | PRN

## 2015-08-16 MED ORDER — BUPIVACAINE HCL (PF) 0.25 % IJ SOLN
5.0000 mL | Freq: Once | INTRAMUSCULAR | Status: AC
  Administered 2015-08-16: 7 mL
  Filled 2015-08-16: qty 30

## 2015-08-16 MED ORDER — FENTANYL CITRATE (PF) 100 MCG/2ML IJ SOLN
50.0000 ug | Freq: Once | INTRAMUSCULAR | Status: AC
  Administered 2015-08-16: 50 ug via INTRAVENOUS
  Filled 2015-08-16: qty 2

## 2015-08-16 MED ORDER — HYDROCODONE-ACETAMINOPHEN 5-325 MG PO TABS
1.0000 | ORAL_TABLET | Freq: Once | ORAL | Status: AC
  Administered 2015-08-16: 1 via ORAL
  Filled 2015-08-16: qty 1

## 2015-08-16 NOTE — ED Notes (Signed)
[facility] report given, ETA 20 minutes

## 2015-08-16 NOTE — ED Provider Notes (Signed)
CSN: [id number]     Arrival date & time 08/16/15  1155 History   First MD Initiated Contact with Patient 08/16/15 1207     Chief Complaint  Patient presents with  . Ankle Pain     Patient is a 70 y.o. female presenting with ankle pain. The history is provided by the patient. No language interpreter was used.  Ankle Pain  Meghan [name] is a 70 y.o. female who presents to the Emergency Department complaining of ankle pain, fall.  She was walking down the steps into her basement when she stepped on her 33-year-old grandsons shoe and fell down the last step twisting her left ankle. She felt immediate pain in the left ankle and felt like the bones were moving. She denies any additional injuries. She is unable to bear weight due to pain. No head injury, chest pain, abdominal pain, hip pain. She does not take any blood thinners and has no major medical history other than remote history of breast cancer.  Past Medical History  Diagnosis Date  . Esophageal stricture   . GERD (gastroesophageal reflux disease)   . Diverticulosis of colon (without mention of hemorrhage)   . Colon polyps     Hyperplastic   . Restless legs syndrome (RLS)   . Restless leg syndrome 03/27/2014  . Toe pain, right 09/2014    for 2 wks  . 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 Breast surgery      left 2006  . Cesarean section     Family History  Problem Relation Age of Onset  . COPD Father   . Heart disease Father   . Emphysema Father     smoker  . Breast cancer Sister 64    mastectomy; metastasis to liver and lung  . Heart disease Maternal Grandmother   . Congestive Heart Failure Maternal Grandmother   . Heart disease Paternal Grandmother   . Obesity Brother   . Heart attack Mother   . Neuropathy Neg Hx   . Breast cancer Maternal Aunt     dx. late 51s  . Alzheimer's disease Maternal Aunt   . Breast cancer  Maternal Aunt     dx. 68s  . Squamous cell carcinoma Maternal Aunt     recently removed; dx. 95  . Breast cancer Cousin 62    genetic testing in approx 2015  . Ovarian cancer Cousin     dx. 21s  . Esophageal cancer Cousin     not a smoker   Social History  Substance Use Topics  . Smoking status: Never Smoker   . Smokeless tobacco: Never Used  . Alcohol Use: Yes     Comment: very rarely - wine   OB History    No data available     Review of Systems  All other systems reviewed and are negative.     Allergies  Review of patient's allergies indicates no known allergies.  Home Medications   Prior to Admission medications   Medication Sig Start Date End Date Taking? Authorizing Provider  Calcium Citrate (CITRACAL PO) Take 600 mg by mouth 2 (two) times daily.    Historical Provider, MD  Cholecalciferol (VITAMIN D-3) 1000 UNITS CAPS Take 2,000 Units by mouth daily.     Historical Provider, MD  ciprofloxacin (CIPRO) 500 MG tablet Take 1 tablet (500 mg total) by mouth 2 (two) times daily.  08/14/15   Irene Shipper, MD  clidinium-chlordiazePOXIDE (LIBRAX) 5-2.5 MG per capsule Take 1 capsule by mouth as needed. 12/08/14 01/28/16  Irene Shipper, MD  FIBER SELECT GUMMIES PO Take by mouth.    Historical Provider, MD  fluticasone (FLONASE) 50 MCG/ACT nasal spray Place 1 spray into both nostrils as needed for allergies or rhinitis.    Historical Provider, MD  gabapentin (NEURONTIN) 100 MG capsule Take 132m po three times daily  and 3069mpohs 04/03/15   NaDennie BibleNP  Misc Natural Products (OSTEO BI-FLEX ADV TRIPLE ST) TABS Take 1 tablet by mouth 2 (two) times daily.    Historical Provider, MD  Multiple Vitamin (MULTIVITAMIN) tablet Take 1 tablet by mouth daily.    Historical Provider, MD  NEXIUM 40 MG capsule TAKE 1 CAPSULE DAILY 07/31/15   JoIrene ShipperMD  Omega-3 Fatty Acids (FISH OIL) 1200 MG CAPS Take 1 capsule by mouth daily.    Historical Provider, MD   BP 138/90 mmHg   Pulse 86  Temp(Src) 97.9 F (36.6 C) (Oral)  Resp 14  Ht 5' 4"  (1.626 m)  Wt 175 lb (79.379 kg)  BMI 30.02 kg/m2  SpO2 100% Physical Exam  Constitutional: She is oriented to person, place, and time. She appears well-developed and well-nourished.  HENT:  Head: Normocephalic and atraumatic.  Mouth/Throat: Oropharynx is clear and moist.  Neck: Neck supple.  Cardiovascular: Normal rate and regular rhythm.   Pulmonary/Chest: Effort normal and breath sounds normal. No respiratory distress.  Musculoskeletal:  2+ DP pulses the left lower extremity. There is moderate swelling and tenderness to the left ankle. There is no tenderness to the foot. There is mild tenderness to the proximal tib-fib. Flexion-extension is intact in the knee.  Neurological: She is alert and oriented to person, place, and time.  Sensation to light touch intact throughout the foot.  Skin: Skin is warm and dry.  Psychiatric: She has a normal mood and affect. Her behavior is normal.  Nursing note and vitals reviewed.   ED Course  Procedures   Procedural sedation Performed by: ElQuintella Reichertonsent: Verbal consent obtained. Risks and benefits: risks, benefits and alternatives were discussed Required items: required blood products, implants, devices, and special equipment available Patient identity confirmed: arm band and provided demographic data Time out: Immediately prior to procedure a "time out" was called to verify the correct patient, procedure, equipment, support staff and site/side marked as required.  Sedation type: moderate (conscious) sedation NPO time confirmed and considedered  Sedatives: PROPOFOL  Physician Time at Bedside: 30  Vitals: Vital signs were monitored during sedation. Cardiac Monitor, pulse oximeter Patient tolerance: Patient tolerated the procedure well with no immediate complications. Comments: Pt with uneventful recovered. Returned to pre-procedural sedation baseline  Reduction of  dislocation Date/Time: 3:51 PM Performed by: ElQuintella Reichertuthorized by: ElQuintella Reichertonsent: Verbal consent obtained. Risks and benefits: risks, benefits and alternatives were discussed Consent given by: patient Required items: required blood products, implants, devices, and special equipment available Time out: Immediately prior to procedure a "time out" was called to verify the correct patient, procedure, equipment, support staff and site/side marked as required.  Patient sedated: per note with propofol  Vitals: Vital signs were monitored during sedation. Patient tolerance: Patient tolerated the procedure well with no immediate complications. Joint: left ankle Reduction technique: traction, manipulation  Patient with fracture dislocation of the left ankle. She was initially sedated with attempts to reduce. Post reduction films demonstrated persistent dislocation. Second  attempt was made with recurrent sedation. A second attempt at reduction with Dr. Tyrone Nine.  There was improved alignment on repeat reduction attempt but persistent dislocation on imaging. She was splinted following  reduction. Post reduction 2+ DP pulses present with good perfusion of the digits, brisk capillary refill.   Labs Review Labs Reviewed - No data to display  Imaging Review Dg Tibia/fibula Left  08/16/2015  CLINICAL DATA:  Falling garage with pain and swelling. Initial encounter. EXAM: LEFT TIBIA AND FIBULA - 2 VIEW COMPARISON:  None. FINDINGS: Trimalleolar ankle fracture as described on dedicated ankle radiography. The ankle is posteriorly dislocated. No more proximal fracture or malalignment in the leg. IMPRESSION: Ankle fracture dislocation as described on dedicated imaging. No proximal leg fracture. Electronically Signed   By: Monte Fantasia M.D.   On: 08/16/2015 12:41   Dg Ankle Complete Left  08/16/2015  CLINICAL DATA:  Follow up garage with pain and swelling. Initial encounter. EXAM: LEFT ANKLE  COMPLETE - 3+ VIEW COMPARISON:  None. FINDINGS: Medial malleolus, coronal oblique distal fibula, and posterior malleolus fractures with posterior dislocation of the ankle joint. Due to overlapping fracture fragments the extent of plafond involvement is uncertain. Frontally, no convincing talar dome fracture. Associated soft tissue swelling. IMPRESSION: Trimalleolar fracture with posterior dislocation, as described. Electronically Signed   By: Monte Fantasia M.D.   On: 08/16/2015 12:40   Dg Ankle Left Port  08/16/2015  CLINICAL DATA:  Post reduction. EXAM: PORTABLE LEFT ANKLE - 2 VIEW (note that only a lateral view was requested and performed.) COMPARISON:  Earlier same day. FINDINGS: Known trimalleolar fracture post reduction and casting. Exam demonstrates slight improvement in moderate anterior subluxation of the tibia on the talus when compared to the previous post reduction films from 14:29 p.m. although no significant change compared to the original films from earlier today at 12:21 p.m. No change in displaced posterior malleolar fracture. Remainder of the exam is unchanged. IMPRESSION: Continued moderate anterior subluxation of the distal tibia on the talus slightly improved compared to the post reduction film from 14:29 p.m. although unchanged from 12:21 p.m. Known trimalleolar fracture with displaced posterior malleolar fracture unchanged. Electronically Signed   By: Marin Olp M.D.   On: 08/16/2015 15:46   Dg Ankle Left Port  08/16/2015  CLINICAL DATA:  Left ankle fracture. EXAM: PORTABLE LEFT ANKLE - 2 VIEW COMPARISON:  Earlier today FINDINGS: Initial encounter Persistent or worsened posterior dislocation. Trimalleolar fractures as previously described. No new fracture is seen. IMPRESSION: Unchanged to increased posterior ankle dislocation. Trimalleolar fractures as previously described. Electronically Signed   By: Monte Fantasia M.D.   On: 08/16/2015 15:00   I have personally reviewed and  evaluated these images and lab results as part of my medical decision-making.   EKG Interpretation None      MDM   Final diagnoses:  Trimalleolar fracture, left, closed, initial encounter  Ankle pain    Patient here for evaluation of injuries following a mechanical fall with significant swelling to the left ankle concerning for fracture. She was treated with pain medication on ED arrival. Imaging demonstrates trimalleolar fracture with dislocation. Discussed the case with Dr. Ninfa Linden with orthopedic surgery. Recommends attempt at reduction. Post reduction patient with persistent dislocation, discuss the case with Dr. Ninfa Linden Who recommends transfer to there was a long emergency department for repeat sedation in the Emergency Department.  He will see the patient in the Emergency Department and attempt reduction.  D/w Dr. Laneta Simmers at [facility] ED  who agrees to accept the patient in transfer.    Quintella Reichert, MD 08/16/15 [phone]

## 2015-08-16 NOTE — ED Notes (Signed)
[facility] here for transport 

## 2015-08-16 NOTE — ED Notes (Signed)
Propofol 110mg  total given for both sedation procedures.

## 2015-08-16 NOTE — ED Notes (Signed)
First set of splints (short posterior & stirrup splint to left foot) were overseen by MD Ralene Bathe. Splints were redone a second time and overseen by MD Ralene Bathe & MD Tyrone Nine. Pt was informed of each splint procedure and so was her husband.

## 2015-08-16 NOTE — Sedation Documentation (Signed)
Pain "5/10 when not moving it"

## 2015-08-16 NOTE — ED Notes (Signed)
EDP at [facility] 

## 2015-08-16 NOTE — Consult Note (Signed)
Reason for Consult:  Left ankle pain with known ankle fracture/dislocation Referring Physician: Lind E Schrader is an 70 y.o. female.  HPI:   70 yo female who accidentally tripped over her grand-kids shoes falling hard and twisting her left ankle.  With severe pain and the inability to ambulate, she was seen at [facility] and found to have an unstable left ankle fracture/dislocation.  The ED staff there did try reducing the dislocation under sedation, but this was quite difficult due to posterior subluxation of the ankle joint.  She was transported to the [facility] ED for further Ortho evaluation and treatment.  She reports only severe left ankle pain, but denies left foot numbness and denies other injuries.  Past Medical History  Diagnosis Date  . Esophageal stricture   . GERD (gastroesophageal reflux disease)   . Diverticulosis of colon (without mention of hemorrhage)   . Colon polyps     Hyperplastic   . Restless legs syndrome (RLS)   . Restless leg syndrome 03/27/2014  . Toe pain, right 09/2014    for 2 wks  . 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 Breast surgery      left 2006  . Cesarean section      Family History  Problem Relation Age of Onset  . COPD Father   . Heart disease Father   . Emphysema Father     smoker  . Breast cancer Sister 27    mastectomy; metastasis to liver and lung  . Heart disease Maternal Grandmother   . Congestive Heart Failure Maternal Grandmother   . Heart disease Paternal Grandmother   . Obesity Brother   . Heart attack Mother   . Neuropathy Neg Hx   . Breast cancer Maternal Aunt     dx. late 62s  . Alzheimer's disease Maternal Aunt   . Breast cancer Maternal Aunt     dx. 8s  . Squamous cell carcinoma Maternal Aunt     recently removed; dx. 95  . Breast cancer Cousin 62    genetic testing in approx 2015  . Ovarian cancer  Cousin     dx. 63s  . Esophageal cancer Cousin     not a smoker    Social History:  reports that she has never smoked. She has never used smokeless tobacco. She reports that she drinks alcohol. She reports that she does not use illicit drugs.  Allergies: No Known Allergies  Medications: I have reviewed the patient's current medications.  No results found for this or any previous visit (from the past 48 hour(s)).  Dg Tibia/fibula Left  08/16/2015  CLINICAL DATA:  Falling garage with pain and swelling. Initial encounter. EXAM: LEFT TIBIA AND FIBULA - 2 VIEW COMPARISON:  None. FINDINGS: Trimalleolar ankle fracture as described on dedicated ankle radiography. The ankle is posteriorly dislocated. No more proximal fracture or malalignment in the leg. IMPRESSION: Ankle fracture dislocation as described on dedicated imaging. No proximal leg fracture. Electronically Signed   By: Monte Fantasia M.D.   On: 08/16/2015 12:41   Dg Ankle Complete Left  08/16/2015  CLINICAL DATA:  Follow up garage with pain and swelling. Initial encounter. EXAM: LEFT ANKLE COMPLETE - 3+ VIEW COMPARISON:  None. FINDINGS: Medial malleolus, coronal oblique distal fibula, and posterior malleolus fractures with posterior dislocation of the ankle joint. Due to overlapping fracture fragments  the extent of plafond involvement is uncertain. Frontally, no convincing talar dome fracture. Associated soft tissue swelling. IMPRESSION: Trimalleolar fracture with posterior dislocation, as described. Electronically Signed   By: Monte Fantasia M.D.   On: 08/16/2015 12:40   Dg Ankle Left Port  08/16/2015  CLINICAL DATA:  Post reduction. EXAM: PORTABLE LEFT ANKLE - 2 VIEW (note that only a lateral view was requested and performed.) COMPARISON:  Earlier same day. FINDINGS: Known trimalleolar fracture post reduction and casting. Exam demonstrates slight improvement in moderate anterior subluxation of the tibia on the talus when compared to the  previous post reduction films from 14:29 p.m. although no significant change compared to the original films from earlier today at 12:21 p.m. No change in displaced posterior malleolar fracture. Remainder of the exam is unchanged. IMPRESSION: Continued moderate anterior subluxation of the distal tibia on the talus slightly improved compared to the post reduction film from 14:29 p.m. although unchanged from 12:21 p.m. Known trimalleolar fracture with displaced posterior malleolar fracture unchanged. Electronically Signed   By: Marin Olp M.D.   On: 08/16/2015 15:46   Dg Ankle Left Port  08/16/2015  CLINICAL DATA:  Left ankle fracture. EXAM: PORTABLE LEFT ANKLE - 2 VIEW COMPARISON:  Earlier today FINDINGS: Initial encounter Persistent or worsened posterior dislocation. Trimalleolar fractures as previously described. No new fracture is seen. IMPRESSION: Unchanged to increased posterior ankle dislocation. Trimalleolar fractures as previously described. Electronically Signed   By: Monte Fantasia M.D.   On: 08/16/2015 15:00    Review of Systems  All other systems reviewed and are negative.  Blood pressure 138/74, pulse 101, temperature 98 F (36.7 C), temperature source Oral, resp. rate 14, height 5' 4" (1.626 m), weight 79.379 kg (175 lb), SpO2 99 %. Physical Exam  Constitutional: She is oriented to person, place, and time. She appears well-developed and well-nourished.  HENT:  Head: Normocephalic and atraumatic.  Eyes: EOM are normal. Pupils are equal, round, and reactive to light.  Neck: Normal range of motion. Neck supple.  Cardiovascular: Normal rate and regular rhythm.   Respiratory: Effort normal and breath sounds normal.  GI: Soft. Bowel sounds are normal.  Musculoskeletal:       Left ankle: She exhibits decreased range of motion, swelling, ecchymosis and deformity. Tenderness. Lateral malleolus, medial malleolus and posterior TFL tenderness found.  Neurological: She is alert and oriented  to person, place, and time.  Skin: Skin is warm and dry.  Psychiatric: She has a normal mood and affect.   Her left foot is well-perfused with normal sensation. There are no areas of soft-tissue compromise   Assessment/Plan: Left trimalleolar ankle fracture dislocation 1)  In the Ed with the assistance of Dr. Dolly Rias, we were able to perform a closed reduction and splinting or her left ankle.  I first placed an injection of plain marcaine into the ankle joint and then Dr. Dolly Rias was able to provide conscious sedation.  He even assisted in the reduction.  A splint was applied and post-reduction x-rays confirmed a reduced joint. 2)  She tolerated the sedation and reduction well.  She understands that she will need to keep off of her left ankle and no-weight bearing at all with ice and elevation.  My office will contact her early next week to let her know when definitive surgery will occur and other details.  Mcarthur Rossetti 08/16/2015, 6:40 PM

## 2015-08-16 NOTE — Sedation Documentation (Signed)
Family updated as to patient's status. At bedside

## 2015-08-16 NOTE — ED Notes (Signed)
Tripped/injured left ankle this am-was assisted out of car to w/c to ED

## 2015-08-16 NOTE — ED Notes (Signed)
Bed: [id number] Expected date:  Expected time:  Means of arrival:  Comments: Pt from [place]- ankle fracture

## 2015-08-16 NOTE — ED Provider Notes (Signed)
Reduction of dislocation Date/Time: 3:51 PM Performed by: Cecilio Asper Authorized by: Cecilio Asper Consent: Verbal consent obtained. Risks and benefits: risks, benefits and alternatives were discussed Consent given by: patient Required items: required blood products, implants, devices, and special equipment available Time out: Immediately prior to procedure a "time out" was called to verify the correct patient, procedure, equipment, support staff and site/side marked as required.  Patient sedated: By Dr. Ralene Bathe, propofol  Vitals: Vital signs were monitored during sedation. Patient tolerance: Patient tolerated the procedure well with no immediate complications. Joint: L ankle Reduction technique: distraction.  Ankle very unstable, held in place with placement of splint.  Xray with persisted dislocation.     Deno Etienne, DO 08/16/15 1552

## 2015-08-16 NOTE — ED Notes (Signed)
Patient transported to X-ray via [facility]

## 2015-08-16 NOTE — ED Provider Notes (Signed)
Physical Exam  BP 137/84 mmHg  Pulse 99  Temp(Src) 98 F (36.7 C) (Oral)  Resp 22  Ht 5\' 4"  (1.626 m)  Wt 175 lb (79.379 kg)  BMI 30.02 kg/m2  SpO2 99%  Physical Exam  Constitutional: She appears well-developed and well-nourished.  HENT:  Head: Normocephalic and atraumatic.  Neck: Normal range of motion.  Cardiovascular: Normal rate and regular rhythm.   Pulmonary/Chest: No stridor. No respiratory distress.  Abdominal: She exhibits no distension.  Musculoskeletal: She exhibits tenderness (left ankle with palpation/ROM).  Neurological: She is alert.  Nursing note and vitals reviewed.   ED Course  .Sedation Date/Time: 08/16/2015 11:45 PM Performed by: Merrily Pew Authorized by: Merrily Pew  Consent:    Consent obtained:  Verbal and written   Consent given by:  Patient   Risks discussed:  Allergic reaction, dysrhythmia, inadequate sedation, nausea, vomiting, respiratory compromise necessitating ventilatory assistance and intubation, prolonged sedation necessitating reversal and prolonged hypoxia resulting in organ damage   Alternatives discussed:  Regional anesthesia and analgesia without sedation Indications:    Sedation purpose:  Fracture reduction   Procedure necessitating sedation performed by:  Different physician   Intended level of sedation:  Moderate (conscious sedation) Pre-sedation assessment:    Time since last food or drink:  7 hours   ASA classification: class 1 - normal, healthy patient     Neck mobility: normal     Mouth opening:  3 or more finger widths   Thyromental distance:  3 finger widths   Mallampati score:  II - soft palate, uvula, fauces visible   Pre-sedation assessments completed and reviewed: airway patency, cardiovascular function, hydration status, mental status, nausea/vomiting, pain level, respiratory function and temperature     Pre-sedation assessment completed:  08/16/2015 6:07 PM Immediate pre-procedure details:    Reassessment:  Patient reassessed immediately prior to procedure     Reviewed: vital signs, relevant labs/tests and NPO status     Verified: bag valve mask available, emergency equipment available, intubation equipment available, IV patency confirmed, oxygen available, reversal medications available and suction available   Procedure details (see MAR for exact dosages):    Sedation start time:  08/16/2015 6:09 PM   Preoxygenation:  Nasal cannula   Sedation:  Propofol   Analgesia:  Fentanyl   Intra-procedure monitoring:  Blood pressure monitoring, cardiac monitor, continuous capnometry, continuous pulse oximetry, frequent LOC assessments and frequent vital sign checks   Intra-procedure events: none     Sedation end time:  08/16/2015 6:35 PM Post-procedure details:    Post-sedation assessment completed:  08/16/2015 6:40 PM   Attendance: Constant attendance by certified staff until patient recovered     Recovery: Patient returned to pre-procedure baseline     Post-sedation assessments completed and reviewed: airway patency, cardiovascular function, hydration status, mental status, nausea/vomiting, pain level, respiratory function and temperature     Patient is stable for discharge or admission: Yes     Patient tolerance:  Tolerated well, no immediate complications   MDM Ankle fracture transferred from [facility] for ortho reduction. Sedated as above, reduced by Dr. Ninfa Linden with per his note. Will follow up with him later for surgery. NVI after reduction and splint application.    New Prescriptions: Discharge Medication List as of 08/16/2015  6:45 PM    START taking these medications   Details  oxyCODONE (ROXICODONE) 5 MG immediate release tablet Take 1-2 tablets (5-10 mg total) by mouth every 4 (four) hours as needed for severe pain., Starting 08/16/2015, Until Discontinued,  Print        I have personally and contemperaneously reviewed labs and imaging and used in my decision making as above.   A medical  screening exam was performed and I feel the patient has had an appropriate workup for their chief complaint at this time and likelihood of emergent condition existing is low. Their vital signs are stable. They have been counseled on decision, discharge, follow up and which symptoms necessitate immediate return to the emergency department.  They verbally stated understanding and agreement with plan and discharged in stable condition.        Merrily Pew, MD 08/17/15 [phone]

## 2015-08-16 NOTE — ED Notes (Signed)
Via [facility]--spoke with Doug for transfer to [facility] ED--Dr. Rush Farmer +

## 2015-08-16 NOTE — ED Notes (Addendum)
Patient reports tripping and falling when going down steps. Patient denies LOC or striking her head. Swelling noted to left ankle

## 2015-08-21 ENCOUNTER — Other Ambulatory Visit: Payer: Self-pay | Admitting: Physician Assistant

## 2015-08-21 NOTE — Progress Notes (Signed)
Please place orders in epic for surgical procedure for 08/24/2015. Pt has PAT appt on 08/22/2015. Thanks.

## 2015-08-22 ENCOUNTER — Encounter (HOSPITAL_COMMUNITY)
Admission: RE | Admit: 2015-08-22 | Discharge: 2015-08-22 | Disposition: A | Discharge status: Home / Self Care | Payer: Medicare Other | Source: Ambulatory Visit | Attending: Orthopaedic Surgery | Admitting: Orthopaedic Surgery

## 2015-08-22 ENCOUNTER — Encounter (HOSPITAL_COMMUNITY): Payer: Self-pay

## 2015-08-22 ENCOUNTER — Ambulatory Visit (HOSPITAL_COMMUNITY)
Admission: RE | Admit: 2015-08-22 | Discharge: 2015-08-22 | Disposition: A | Discharge status: Home / Self Care | Payer: Medicare Other | Source: Ambulatory Visit | Attending: Anesthesiology | Admitting: Anesthesiology

## 2015-08-22 DIAGNOSIS — W1800XA Striking against unspecified object with subsequent fall, initial encounter: Secondary | ICD-10-CM

## 2015-08-22 DIAGNOSIS — Z01818 Encounter for other preprocedural examination: Secondary | ICD-10-CM | POA: Insufficient documentation

## 2015-08-22 DIAGNOSIS — R918 Other nonspecific abnormal finding of lung field: Secondary | ICD-10-CM | POA: Diagnosis not present

## 2015-08-22 HISTORY — DX: Personal history of irradiation: Z92.3

## 2015-08-22 HISTORY — DX: Personal history of urinary (tract) infections: Z87.440

## 2015-08-22 HISTORY — DX: Urgency of urination: R39.15

## 2015-08-22 HISTORY — DX: Unspecified osteoarthritis, unspecified site: M19.90

## 2015-08-22 HISTORY — DX: Anesthesia of skin: R20.0

## 2015-08-22 LAB — BASIC METABOLIC PANEL
ANION GAP: 10 (ref 5–15)
BUN: 21 mg/dL — ABNORMAL HIGH (ref 6–20)
CALCIUM: 9.6 mg/dL (ref 8.9–10.3)
CHLORIDE: 105 mmol/L (ref 101–111)
CO2: 24 mmol/L (ref 22–32)
Creatinine, Ser: 1.08 mg/dL — ABNORMAL HIGH (ref 0.44–1.00)
GFR calc non Af Amer: 51 mL/min — ABNORMAL LOW (ref >60)
GFR, EST AFRICAN AMERICAN: 59 mL/min — AB (ref >60)
GLUCOSE: 122 mg/dL — AB (ref 65–99)
POTASSIUM: 4.3 mmol/L (ref 3.5–5.1)
Sodium: 139 mmol/L (ref 135–145)

## 2015-08-22 LAB — CBC
HEMATOCRIT: 38.5 % (ref 36.0–46.0)
HEMOGLOBIN: 13.1 g/dL (ref 12.0–15.0)
MCH: 30.7 pg (ref 26.0–34.0)
MCHC: 34 g/dL (ref 30.0–36.0)
MCV: 90.2 fL (ref 78.0–100.0)
Platelets: 210 10*3/uL (ref 150–400)
RBC: 4.27 MIL/uL (ref 3.87–5.11)
RDW: 14.3 % (ref 11.5–15.5)
WBC: 15.3 10*3/uL — ABNORMAL HIGH (ref 4.0–10.5)

## 2015-08-22 NOTE — Progress Notes (Signed)
CBC in epic per PAT visit 08/22/2015 sent to Dr Kathrynn Speed

## 2015-08-22 NOTE — Progress Notes (Signed)
CBC and CXR results in epic per PAT visit 08/22/2015 sent to Dr Kathrynn Speed

## 2015-08-22 NOTE — Patient Instructions (Signed)
JACOBIE LOMBARDOZZI  08/22/2015   Your procedure is scheduled on: Friday August 24, 2015  Report to [facility] Main  Entrance take [place]  elevators to 3rd floor to  [place] at 1:45 PM.  Call this number if you have problems the morning of surgery [phone]   Remember: ONLY 1 PERSON MAY GO WITH YOU TO SHORT STAY TO GET  READY MORNING OF [place].  Do not eat food  After Midnight but may take clear liquids till 9:45 am day of surgery then nothing by mouth.     Take these medicines the morning of surgery with A SIP OF WATER: Nexium; Oxycodone if needed; May use Flonase if needed                                You may not have any metal on your body including hair pins and              piercings  Do not wear jewelry, make-up, lotions, powders or perfumes, deodorant             Do not wear nail polish.  Do not shave  48 hours prior to surgery.             Do not bring valuables to the hospital. [place].  Contacts, dentures or bridgework may not be worn into surgery.  Leave suitcase in the car. After surgery it may be brought to your room.   _____________________________________________________________________             [facility] - Preparing for Surgery Before surgery, you can play an important role.  Because skin is not sterile, your skin needs to be as free of germs as possible.  You can reduce the number of germs on your skin by washing with CHG (chlorahexidine gluconate) soap before surgery.  CHG is an antiseptic cleaner which kills germs and bonds with the skin to continue killing germs even after washing. Please DO NOT use if you have an allergy to CHG or antibacterial soaps.  If your skin becomes reddened/irritated stop using the CHG and inform your nurse when you arrive at Short Stay. Do not shave (including legs and underarms) for at least 48 hours prior to the first CHG shower.  You may  shave your face/neck. Please follow these instructions carefully:  1.  Shower with CHG Soap the night before surgery and the  morning of Surgery.  2.  If you choose to wash your hair, wash your hair first as usual with your  normal  shampoo.  3.  After you shampoo, rinse your hair and body thoroughly to remove the  shampoo.                           4.  Use CHG as you would any other liquid soap.  You can apply chg directly  to the skin and wash                       Gently with a scrungie or clean washcloth.  5.  Apply the CHG Soap to your body ONLY FROM THE NECK DOWN.  Do not use on face/ open                           Wound or open sores. Avoid contact with eyes, ears mouth and genitals (private parts).                       Wash face,  Genitals (private parts) with your normal soap.             6.  Wash thoroughly, paying special attention to the area where your surgery  will be performed.  7.  Thoroughly rinse your body with warm water from the neck down.  8.  DO NOT shower/wash with your normal soap after using and rinsing off  the CHG Soap.                9.  Pat yourself dry with a clean towel.            10.  Wear clean pajamas.            11.  Place clean sheets on your bed the night of your first shower and do not  sleep with pets. Day of Surgery : Do not apply any lotions/deodorants the morning of surgery.  Please wear clean clothes to the hospital/surgery center.  FAILURE TO FOLLOW THESE INSTRUCTIONS MAY RESULT IN THE CANCELLATION OF YOUR SURGERY PATIENT SIGNATURE_________________________________  NURSE SIGNATURE__________________________________  ________________________________________________________________________    CLEAR LIQUID DIET   Foods Allowed                                                                     Foods Excluded  Coffee and tea, regular and decaf                             liquids that you cannot  Plain Jell-O in any flavor                                              see through such as: Fruit ices (not with fruit pulp)                                     milk, soups, orange juice  Iced Popsicles                                    All solid food Carbonated beverages, regular and diet                                    Cranberry, grape and apple juices Sports drinks like Gatorade Lightly seasoned clear broth or consume(fat free) Sugar, honey syrup  Sample Menu Breakfast  Lunch                                     Supper Cranberry juice                    Beef broth                            Chicken broth Jell-O                                     Grape juice                           Apple juice Coffee or tea                        Jell-O                                      Popsicle                                                Coffee or tea                        Coffee or tea  _____________________________________________________________________

## 2015-08-24 ENCOUNTER — Observation Stay (HOSPITAL_COMMUNITY): Payer: Medicare Other

## 2015-08-24 ENCOUNTER — Ambulatory Visit (HOSPITAL_COMMUNITY)
Admission: RE | Admit: 2015-08-24 | Discharge: 2015-08-25 | Disposition: A | Discharge status: Home / Self Care | Payer: Medicare Other | Source: Ambulatory Visit | Attending: Orthopaedic Surgery | Admitting: Orthopaedic Surgery

## 2015-08-24 ENCOUNTER — Observation Stay (HOSPITAL_COMMUNITY): Payer: Medicare Other | Admitting: Anesthesiology

## 2015-08-24 ENCOUNTER — Encounter (HOSPITAL_COMMUNITY)
Admission: RE | Disposition: A | Discharge status: Home / Self Care | Payer: Self-pay | Source: Ambulatory Visit | Attending: Orthopaedic Surgery

## 2015-08-24 ENCOUNTER — Encounter (HOSPITAL_COMMUNITY): Payer: Self-pay | Admitting: *Deleted

## 2015-08-24 DIAGNOSIS — K219 Gastro-esophageal reflux disease without esophagitis: Secondary | ICD-10-CM | POA: Diagnosis not present

## 2015-08-24 DIAGNOSIS — S82852A Displaced trimalleolar fracture of left lower leg, initial encounter for closed fracture: Secondary | ICD-10-CM | POA: Diagnosis not present

## 2015-08-24 DIAGNOSIS — G8918 Other acute postprocedural pain: Secondary | ICD-10-CM | POA: Diagnosis not present

## 2015-08-24 DIAGNOSIS — S82892A Other fracture of left lower leg, initial encounter for closed fracture: Secondary | ICD-10-CM | POA: Diagnosis present

## 2015-08-24 DIAGNOSIS — S82842A Displaced bimalleolar fracture of left lower leg, initial encounter for closed fracture: Secondary | ICD-10-CM | POA: Diagnosis not present

## 2015-08-24 DIAGNOSIS — Z853 Personal history of malignant neoplasm of breast: Secondary | ICD-10-CM | POA: Diagnosis not present

## 2015-08-24 DIAGNOSIS — W010XXA Fall on same level from slipping, tripping and stumbling without subsequent striking against object, initial encounter: Secondary | ICD-10-CM | POA: Diagnosis not present

## 2015-08-24 DIAGNOSIS — M25572 Pain in left ankle and joints of left foot: Secondary | ICD-10-CM | POA: Insufficient documentation

## 2015-08-24 HISTORY — DX: Displaced trimalleolar fracture of left lower leg, initial encounter for closed fracture: S82.852A

## 2015-08-24 HISTORY — PX: ORIF ANKLE FRACTURE: SHX5408

## 2015-08-24 SURGERY — OPEN REDUCTION INTERNAL FIXATION (ORIF) ANKLE FRACTURE
Anesthesia: General | Site: Ankle | Laterality: Left

## 2015-08-24 MED ORDER — FENTANYL CITRATE (PF) 100 MCG/2ML IJ SOLN
INTRAMUSCULAR | Status: DC | PRN
  Administered 2015-08-24 (×4): 50 ug via INTRAVENOUS

## 2015-08-24 MED ORDER — ACETAMINOPHEN 650 MG RE SUPP
650.0000 mg | Freq: Four times a day (QID) | RECTAL | Status: DC | PRN
  Filled 2015-08-24: qty 1

## 2015-08-24 MED ORDER — ARTIFICIAL TEARS OP OINT
TOPICAL_OINTMENT | OPHTHALMIC | Status: AC
  Filled 2015-08-24: qty 3.5

## 2015-08-24 MED ORDER — OXYCODONE HCL 5 MG PO TABS
5.0000 mg | ORAL_TABLET | ORAL | Status: DC | PRN
  Administered 2015-08-24 – 2015-08-25 (×2): 10 mg via ORAL
  Filled 2015-08-24 (×2): qty 2

## 2015-08-24 MED ORDER — KETOROLAC TROMETHAMINE 30 MG/ML IJ SOLN
INTRAMUSCULAR | Status: AC
  Filled 2015-08-24: qty 1

## 2015-08-24 MED ORDER — HYDROMORPHONE HCL 1 MG/ML IJ SOLN
1.0000 mg | INTRAMUSCULAR | Status: DC | PRN
  Administered 2015-08-24: 1 mg via INTRAVENOUS
  Filled 2015-08-24: qty 1

## 2015-08-24 MED ORDER — KETOROLAC TROMETHAMINE 30 MG/ML IJ SOLN
30.0000 mg | Freq: Once | INTRAMUSCULAR | Status: AC | PRN
Start: 2015-08-24 — End: 2015-08-24
  Administered 2015-08-24: 30 mg via INTRAVENOUS

## 2015-08-24 MED ORDER — LACTATED RINGERS IV SOLN
INTRAVENOUS | Status: DC | PRN
Start: 2015-08-24 — End: 2015-08-24
  Administered 2015-08-24: 15:00:00 via INTRAVENOUS

## 2015-08-24 MED ORDER — DEXAMETHASONE SODIUM PHOSPHATE 10 MG/ML IJ SOLN
INTRAMUSCULAR | Status: DC | PRN
  Administered 2015-08-24: 10 mg via INTRAVENOUS

## 2015-08-24 MED ORDER — HYDROMORPHONE HCL 1 MG/ML IJ SOLN
INTRAMUSCULAR | Status: DC | PRN
  Administered 2015-08-24 (×4): 0.5 mg via INTRAVENOUS

## 2015-08-24 MED ORDER — BUPIVACAINE HCL (PF) 0.5 % IJ SOLN
INTRAMUSCULAR | Status: AC
  Filled 2015-08-24: qty 60

## 2015-08-24 MED ORDER — HYDROMORPHONE HCL 1 MG/ML IJ SOLN
INTRAMUSCULAR | Status: AC
  Filled 2015-08-24: qty 1

## 2015-08-24 MED ORDER — BUPIVACAINE HCL (PF) 0.5 % IJ SOLN
INTRAMUSCULAR | Status: AC
  Filled 2015-08-24: qty 30

## 2015-08-24 MED ORDER — SODIUM CHLORIDE 0.9 % IR SOLN
Status: AC
  Filled 2015-08-24: qty 1

## 2015-08-24 MED ORDER — PROPOFOL 10 MG/ML IV BOLUS
INTRAVENOUS | Status: DC | PRN
  Administered 2015-08-24: 200 mg via INTRAVENOUS

## 2015-08-24 MED ORDER — VITAMIN D3 25 MCG (1000 UNIT) PO TABS
2000.0000 [IU] | ORAL_TABLET | Freq: Every day | ORAL | Status: DC
Start: 2015-08-24 — End: 2015-08-25
  Administered 2015-08-24 – 2015-08-25 (×2): 2000 [IU] via ORAL
  Filled 2015-08-24 (×2): qty 2

## 2015-08-24 MED ORDER — CEFAZOLIN SODIUM-DEXTROSE 2-4 GM/100ML-% IV SOLN
INTRAVENOUS | Status: AC
  Filled 2015-08-24: qty 100

## 2015-08-24 MED ORDER — SODIUM CHLORIDE 0.9 % IR SOLN
Status: DC | PRN
  Administered 2015-08-24: 500 mL

## 2015-08-24 MED ORDER — BUPIVACAINE HCL (PF) 0.5 % IJ SOLN
INTRAMUSCULAR | Status: DC | PRN
  Administered 2015-08-24: 25 mL via PERINEURAL

## 2015-08-24 MED ORDER — CILIDINIUM-CHLORDIAZEPOXIDE 2.5-5 MG PO CAPS
1.0000 | ORAL_CAPSULE | Freq: Four times a day (QID) | ORAL | Status: DC | PRN
  Filled 2015-08-24: qty 1

## 2015-08-24 MED ORDER — GABAPENTIN 300 MG PO CAPS
600.0000 mg | ORAL_CAPSULE | Freq: Every day | ORAL | Status: DC
  Administered 2015-08-24: 600 mg via ORAL
  Filled 2015-08-24 (×3): qty 2

## 2015-08-24 MED ORDER — CEFAZOLIN SODIUM-DEXTROSE 2-3 GM-% IV SOLR
2.0000 g | INTRAVENOUS | Status: AC
  Administered 2015-08-24: 2 g via INTRAVENOUS
  Filled 2015-08-24: qty 50

## 2015-08-24 MED ORDER — FENTANYL CITRATE (PF) 100 MCG/2ML IJ SOLN
INTRAMUSCULAR | Status: AC
Start: 2015-08-24 — End: 2015-08-24
  Filled 2015-08-24: qty 2

## 2015-08-24 MED ORDER — SODIUM CHLORIDE 0.9 % IV SOLN
INTRAVENOUS | Status: DC
  Administered 2015-08-24: 18:00:00 via INTRAVENOUS

## 2015-08-24 MED ORDER — ACETAMINOPHEN 325 MG PO TABS: 650.0000 mg | ORAL_TABLET | Freq: Four times a day (QID) | ORAL | Status: DC | PRN

## 2015-08-24 MED ORDER — LIDOCAINE-EPINEPHRINE (PF) 2 %-1:200000 IJ SOLN
INTRAMUSCULAR | Status: AC
  Filled 2015-08-24: qty 20

## 2015-08-24 MED ORDER — HYDROMORPHONE HCL 2 MG/ML IJ SOLN
INTRAMUSCULAR | Status: AC
  Filled 2015-08-24: qty 1

## 2015-08-24 MED ORDER — MIDAZOLAM HCL 2 MG/2ML IJ SOLN
INTRAMUSCULAR | Status: AC
  Filled 2015-08-24: qty 2

## 2015-08-24 MED ORDER — MIDAZOLAM HCL 5 MG/5ML IJ SOLN
INTRAMUSCULAR | Status: DC | PRN
  Administered 2015-08-24 (×2): 1 mg via INTRAVENOUS

## 2015-08-24 MED ORDER — LIDOCAINE HCL (CARDIAC) 20 MG/ML IV SOLN
INTRAVENOUS | Status: AC
Start: 2015-08-24 — End: 2015-08-24
  Filled 2015-08-24: qty 5

## 2015-08-24 MED ORDER — ONDANSETRON HCL 4 MG/2ML IJ SOLN
INTRAMUSCULAR | Status: DC | PRN
  Administered 2015-08-24: 4 mg via INTRAVENOUS

## 2015-08-24 MED ORDER — DEXAMETHASONE SODIUM PHOSPHATE 10 MG/ML IJ SOLN
INTRAMUSCULAR | Status: AC
  Filled 2015-08-24: qty 1

## 2015-08-24 MED ORDER — METHOCARBAMOL 1000 MG/10ML IJ SOLN
500.0000 mg | Freq: Four times a day (QID) | INTRAVENOUS | Status: DC | PRN
  Administered 2015-08-24: 500 mg via INTRAVENOUS
  Filled 2015-08-24 (×2): qty 5

## 2015-08-24 MED ORDER — ONDANSETRON HCL 4 MG PO TABS: 4.0000 mg | ORAL_TABLET | Freq: Four times a day (QID) | ORAL | Status: DC | PRN

## 2015-08-24 MED ORDER — METOCLOPRAMIDE HCL 5 MG/ML IJ SOLN: 5.0000 mg | Freq: Three times a day (TID) | INTRAMUSCULAR | Status: DC | PRN

## 2015-08-24 MED ORDER — PANTOPRAZOLE SODIUM 40 MG PO TBEC
40.0000 mg | DELAYED_RELEASE_TABLET | Freq: Every day | ORAL | Status: DC
  Administered 2015-08-25: 40 mg via ORAL
  Filled 2015-08-24: qty 1

## 2015-08-24 MED ORDER — HYDROMORPHONE HCL 1 MG/ML IJ SOLN
0.2500 mg | INTRAMUSCULAR | Status: DC | PRN
  Administered 2015-08-24 (×2): 0.5 mg via INTRAVENOUS

## 2015-08-24 MED ORDER — METHOCARBAMOL 500 MG PO TABS
500.0000 mg | ORAL_TABLET | Freq: Four times a day (QID) | ORAL | Status: DC | PRN
  Administered 2015-08-24 – 2015-08-25 (×2): 500 mg via ORAL
  Filled 2015-08-24 (×2): qty 1

## 2015-08-24 MED ORDER — ONDANSETRON HCL 4 MG/2ML IJ SOLN: 4.0000 mg | Freq: Four times a day (QID) | INTRAMUSCULAR | Status: DC | PRN

## 2015-08-24 MED ORDER — ACETAMINOPHEN 650 MG RE SUPP: 650.0000 mg | Freq: Four times a day (QID) | RECTAL | Status: DC | PRN

## 2015-08-24 MED ORDER — METOCLOPRAMIDE HCL 10 MG PO TABS: 5.0000 mg | ORAL_TABLET | Freq: Three times a day (TID) | ORAL | Status: DC | PRN

## 2015-08-24 MED ORDER — FENTANYL CITRATE (PF) 100 MCG/2ML IJ SOLN
INTRAMUSCULAR | Status: AC
  Filled 2015-08-24: qty 2

## 2015-08-24 MED ORDER — CEFAZOLIN SODIUM 1-5 GM-% IV SOLN
1.0000 g | Freq: Four times a day (QID) | INTRAVENOUS | Status: AC
  Administered 2015-08-24 – 2015-08-25 (×3): 1 g via INTRAVENOUS
  Filled 2015-08-24 (×3): qty 50

## 2015-08-24 MED ORDER — LIDOCAINE HCL (CARDIAC) 20 MG/ML IV SOLN
INTRAVENOUS | Status: DC | PRN
  Administered 2015-08-24: 100 mg via INTRAVENOUS

## 2015-08-24 MED ORDER — DIPHENHYDRAMINE HCL 12.5 MG/5ML PO ELIX: 12.5000 mg | ORAL_SOLUTION | ORAL | Status: DC | PRN

## 2015-08-24 MED ORDER — LIDOCAINE-EPINEPHRINE 2 %-1:100000 IJ SOLN
INTRAMUSCULAR | Status: DC | PRN
  Administered 2015-08-24: 15 mL via PERINEURAL

## 2015-08-24 MED ORDER — PROPOFOL 10 MG/ML IV BOLUS
INTRAVENOUS | Status: AC
Start: 2015-08-24 — End: 2015-08-24
  Filled 2015-08-24: qty 20

## 2015-08-24 SURGICAL SUPPLY — 46 items
BANDAGE ACE 4X5 VEL STRL LF (GAUZE/BANDAGES/DRESSINGS) ×1 IMPLANT
BANDAGE ACE 6X5 VEL STRL LF (GAUZE/BANDAGES/DRESSINGS) ×1 IMPLANT
BIT DRILL CANN 2.7 (BIT) ×2
BIT DRILL SRG 2.7XCANN AO CPLG (BIT) IMPLANT
BIT DRL SRG 2.7XCANN AO CPLNG (BIT) ×1
CUFF TOURN SGL QUICK 34 (TOURNIQUET CUFF) ×2
CUFF TRNQT CYL 34X4X40X1 (TOURNIQUET CUFF) ×1 IMPLANT
DRAPE C-ARM 42X120 X-RAY (DRAPES) ×2 IMPLANT
DRAPE C-ARMOR (DRAPES) ×1 IMPLANT
DRAPE U-SHAPE 47X51 STRL (DRAPES) ×2 IMPLANT
DRILL 2.6X122MM WL AO SHAFT (BIT) ×1 IMPLANT
DRSG ADAPTIC 3X8 NADH LF (GAUZE/BANDAGES/DRESSINGS) ×2 IMPLANT
DRSG PAD ABDOMINAL 8X10 ST (GAUZE/BANDAGES/DRESSINGS) ×2 IMPLANT
DURAPREP 26ML APPLICATOR (WOUND CARE) ×2 IMPLANT
ELECT REM PT RETURN 9FT ADLT (ELECTROSURGICAL) ×2
ELECTRODE REM PT RTRN 9FT ADLT (ELECTROSURGICAL) ×1 IMPLANT
GAUZE SPONGE 4X4 12PLY STRL (GAUZE/BANDAGES/DRESSINGS) ×1 IMPLANT
GAUZE XEROFORM 1X8 LF (GAUZE/BANDAGES/DRESSINGS) ×1 IMPLANT
GLOVE BIO SURGEON STRL SZ7.5 (GLOVE) ×2 IMPLANT
GLOVE BIOGEL PI IND STRL 7.5 (GLOVE) IMPLANT
GLOVE BIOGEL PI IND STRL 8 (GLOVE) ×2 IMPLANT
GLOVE BIOGEL PI INDICATOR 7.5 (GLOVE) ×3
GLOVE BIOGEL PI INDICATOR 8 (GLOVE) ×2
GOWN STRL REUS W/TWL XL LVL3 (GOWN DISPOSABLE) ×4 IMPLANT
K-WIRE ORTHOPEDIC 1.4X150L (WIRE) ×6
KIT BASIN OR (CUSTOM PROCEDURE TRAY) ×2 IMPLANT
KWIRE ORTHOPEDIC 1.4X150L (WIRE) IMPLANT
PACK TOTAL JOINT (CUSTOM PROCEDURE TRAY) ×2 IMPLANT
PAD CAST 4YDX4 CTTN HI CHSV (CAST SUPPLIES) IMPLANT
PADDING CAST COTTON 4X4 STRL (CAST SUPPLIES) ×2
PADDING CAST COTTON 6X4 STRL (CAST SUPPLIES) ×1 IMPLANT
PLATE FIBULA 4H (Plate) ×1 IMPLANT
POSITIONER SURGICAL ARM (MISCELLANEOUS) ×2 IMPLANT
SCREW 3.5X10MM (Screw) ×3 IMPLANT
SCREW CANNULATED 4.0MMX48MM (Screw) ×2 IMPLANT
SCREW LOCK 3.5X10MM (Screw) ×3 IMPLANT
SPLINT PLASTER CAST XFAST 5X30 (CAST SUPPLIES) IMPLANT
SPLINT PLASTER XFAST SET 5X30 (CAST SUPPLIES) ×1
STAPLER VISISTAT 35W (STAPLE) ×1 IMPLANT
SUT ETHILON 2 0 PS N (SUTURE) ×5 IMPLANT
SUT VIC AB 0 CT1 36 (SUTURE) ×1 IMPLANT
SUT VIC AB 2-0 CT1 27 (SUTURE) ×4
SUT VIC AB 2-0 CT1 TAPERPNT 27 (SUTURE) ×1 IMPLANT
SYR CONTROL 10ML LL (SYRINGE) ×2 IMPLANT
TOWEL OR 17X26 10 PK STRL BLUE (TOWEL DISPOSABLE) ×4 IMPLANT
TOWEL OR NON WOVEN STRL DISP B (DISPOSABLE) ×2 IMPLANT

## 2015-08-24 NOTE — Transfer of Care (Signed)
Immediate Anesthesia Transfer of Care Note  Patient: Meghan [name]  Procedure(s) Performed: Procedure(s) with comments: OPEN REDUCTION INTERNAL FIXATION (ORIF) LEFT ANKLE FRACTURE (Left) - General with a popliteal block  Patient Location: PACU  Anesthesia Type:General  Level of Consciousness:  sedated, patient cooperative and responds to stimulation  Airway & Oxygen Therapy:Patient Spontanous Breathing and Patient connected to face mask oxgen  Post-op Assessment:  Report given to PACU RN and Post -op Vital signs reviewed and stable  Post vital signs:  Reviewed and stable  Last Vitals:  Filed Vitals:   08/24/15 1248  BP: 131/63  Pulse: 97  Temp: 36.9 C  Resp: 16    Complications: No apparent anesthesia complications

## 2015-08-24 NOTE — H&P (Signed)
Meghan [name] is an 70 y.o. female.   Chief Complaint:  Left ankle pain; known unstable ankle fracture HPI:   70 yo female who sustained a left ankle fracture/dislocation after accidentally tripping over her grandchild's shoes.  Was seen in the ED and a closed reduction of the dislocation was performed and a splint applied.  She now presents for definitive fixation of the unstable ankle fracture.  Risks and benefits have been discussed in detail.  Past Medical History  Diagnosis Date  . Esophageal stricture   . GERD (gastroesophageal reflux disease)   . Diverticulosis of colon (without mention of hemorrhage)   . Colon polyps     Hyperplastic   . Restless legs syndrome (RLS)   . Restless leg syndrome 03/27/2014  . Toe pain, right 09/2014    for 2 wks  . 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 Numbness     feet bilat at nighttime  . History of urinary tract infection   . Urinary urgency   . Arthritis   . History of radiation therapy     Past Surgical History  Procedure Laterality Date  . Breast surgery      left 2006  . Cesarean section      Family History  Problem Relation Age of Onset  . COPD Father   . Heart disease Father   . Emphysema Father     smoker  . Breast cancer Sister 5    mastectomy; metastasis to liver and lung  . Heart disease Maternal Grandmother   . Congestive Heart Failure Maternal Grandmother   . Heart disease Paternal Grandmother   . Obesity Brother   . Heart attack Mother   . Neuropathy Neg Hx   . Breast cancer Maternal Aunt     dx. late 37s  . Alzheimer's disease Maternal Aunt   . Breast cancer Maternal Aunt     dx. 60s  . Squamous cell carcinoma Maternal Aunt     recently removed; dx. 95  . Breast cancer Cousin 62    genetic testing in approx 2015  . Ovarian cancer Cousin     dx. 63s  . Esophageal cancer Cousin     not a smoker   Social History:  reports that she has never smoked. She has never  used smokeless tobacco. She reports that she drinks alcohol. She reports that she does not use illicit drugs.  Allergies: No Known Allergies  No prescriptions prior to admission    Results for orders placed or performed during the hospital encounter of 08/22/15 (from the past 48 hour(s))  Basic metabolic panel     Status: Abnormal   Collection Time: 08/22/15 10:20 AM  Result Value Ref Range   Sodium 139 135 - 145 mmol/L   Potassium 4.3 3.5 - 5.1 mmol/L   Chloride 105 101 - 111 mmol/L   CO2 24 22 - 32 mmol/L   Glucose, Bld 122 (H) 65 - 99 mg/dL   BUN 21 (H) 6 - 20 mg/dL   Creatinine, Ser 1.08 (H) 0.44 - 1.00 mg/dL   Calcium 9.6 8.9 - 10.3 mg/dL   GFR calc non Af Amer 51 (L) >60 mL/min   GFR calc Af Amer 59 (L) >60 mL/min    Comment: (NOTE) The eGFR has been calculated using the CKD EPI equation. This calculation has not been validated in all clinical situations. eGFR's persistently <60 mL/min signify possible Chronic Kidney Disease.  Anion gap 10 5 - 15  CBC     Status: Abnormal   Collection Time: 08/22/15 10:20 AM  Result Value Ref Range   WBC 15.3 (H) 4.0 - 10.5 K/uL   RBC 4.27 3.87 - 5.11 MIL/uL   Hemoglobin 13.1 12.0 - 15.0 g/dL   HCT 38.5 36.0 - 46.0 %   MCV 90.2 78.0 - 100.0 fL   MCH 30.7 26.0 - 34.0 pg   MCHC 34.0 30.0 - 36.0 g/dL   RDW 14.3 11.5 - 15.5 %   Platelets 210 150 - 400 K/uL   Dg Chest 2 View  08/22/2015  CLINICAL DATA:  Preoperative evaluation for upcoming ankle surgery EXAM: CHEST  2 VIEW COMPARISON:  None. FINDINGS: Cardiac shadow is within normal limits. The lungs are well aerated bilaterally. No focal infiltrate is seen. There is a questionable nodular density identified on the lateral film anteriorly. This is not as well visualized on the frontal film but may superimpose the right infrahilar region. IMPRESSION: Questionable nodular density as described. Noncontrast CT is recommended on a nonemergent basis to further evaluate. These results will be  called to the ordering clinician or representative by the Radiologist Assistant, and communication documented in the PACS or zVision Dashboard. Electronically Signed   By: Inez Catalina M.D.   On: 08/22/2015 10:48    Review of Systems  All other systems reviewed and are negative.   There were no vitals taken for this visit. Physical Exam  Constitutional: She is oriented to person, place, and time. She appears well-developed and well-nourished.  HENT:  Head: Normocephalic and atraumatic.  Eyes: EOM are normal. Pupils are equal, round, and reactive to light.  Neck: Normal range of motion. Neck supple.  Cardiovascular: Normal rate and regular rhythm.   Respiratory: Effort normal and breath sounds normal.  GI: Soft. Bowel sounds are normal.  Musculoskeletal:       Left ankle: She exhibits decreased range of motion, swelling, ecchymosis and deformity. Tenderness. Lateral malleolus and medial malleolus tenderness found.  Neurological: She is alert and oriented to person, place, and time.  Skin: Skin is warm and dry.  Psychiatric: She has a normal mood and affect.     Assessment/Plan Left unstable trimalleolar ankle fracture 1)  To the OR today for open reduction/internal fixation of her left ankle followed by overnight admission.  Mcarthur Rossetti, MD 08/24/2015, 7:23 AM

## 2015-08-24 NOTE — Anesthesia Procedure Notes (Addendum)
Procedure Name: LMA Insertion Date/Time: 08/24/2015 3:37 PM Performed by: Maxwell Caul Pre-anesthesia Checklist: Patient identified, Emergency Drugs available and Suction available Patient Re-evaluated:Patient Re-evaluated prior to inductionOxygen Delivery Method: Circle system utilized Preoxygenation: Pre-oxygenation with 100% oxygen Intubation Type: IV induction LMA: LMA inserted LMA Size: 4.0 Number of attempts: 1 Placement Confirmation: positive ETCO2 and breath sounds checked- equal and bilateral Tube secured with: Tape Dental Injury: Teeth and Oropharynx as per pre-operative assessment    Anesthesia Regional Block:  Popliteal block  Pre-Anesthetic Checklist: ,, timeout performed, Correct Patient, Correct Site, Correct Laterality, Correct Procedure, Correct Position, site marked, Risks and benefits discussed,  Surgical consent,  Pre-op evaluation,  At surgeon's request and post-op pain management  Laterality: Left  Prep: chloraprep       Needles:  Injection technique: Single-shot  Needle Type: Echogenic Stimulator Needle     Needle Length: 9cm 9 cm Needle Gauge: 21 and 21 G    Additional Needles:  Procedures: ultrasound guided (picture in chart) Popliteal block Narrative:  Injection made incrementally with aspirations every 5 mL.  Performed by: Personally   Additional Notes: Patient tolerated the procedure well without complications

## 2015-08-24 NOTE — Op Note (Signed)
NAMEESPYN, DYES NO.:  [id number]  MEDICAL RECORD NO.:  [id number]  LOCATION:  42                         FACILITY:  [facility]  PHYSICIAN:  Lind Guest. Ninfa Linden, M.D.DATE OF BIRTH:  03/29/1946  DATE OF PROCEDURE:  08/24/2015 DATE OF DISCHARGE:                              OPERATIVE REPORT   PREOPERATIVE DIAGNOSIS:  Left unstable trimalleolar ankle fracture, dislocation.  POSTOPERATIVE DIAGNOSIS:  Left unstable trimalleolar ankle fracture, dislocation.  PROCEDURE:  Open reduction and internal fixation of left trimalleolar ankle fracture with fixation of the left lateral malleolus and left medial malleolus only.  IMPLANTS:  Stryker VariAx ankle plating system with a 4-hole distal fibular plate and nonlocking and locking screws just laterally and then medially two 48-mm partially-threaded 4.0 cannulated screws.  SURGEON:  Lind Guest. Ninfa Linden, M.D.  ANESTHESIA: 1. Regional left lower extremity block. 2. General.  TOURNIQUET TIME:  Under an hour and a half.  COMPLICATIONS:  None.  INDICATIONS:  Ms. Spelman is a 70 year old female, who a week ago sustained a mechanical fall tripping over cricket shoes.  She was seen at [facility] and found to have a trimalleolar ankle fracture, dislocation.  She was then transferred to the [facility] Emergency Room and I performed a closed reduction under sedation and was able to put in a well-padded splint.  She now presents for definitive fixation of this fracture understanding that this is an unstable fracture.  She understands the risks and benefits of this surgery as well.  PROCEDURE DESCRIPTION:  After informed consent was obtained, appropriate left leg and ankle were marked.  She was brought to the operating room after anesthesia obtained a regional anesthesia in the holding room. General anesthesia was then obtained after she was placed supine on the operating table.  Nonsterile tourniquet  was placed around her upper left leg and her left leg was prepped and draped from the knee down to the toes with DuraPrep and sterile drapes.  Time-out was called and she was identified as correct patient and correct left ankle.  An Esmarch was used to wrap out the ankle and tourniquet was inflated to 300 mm of pressure.  I then made a lateral incision over the fibula and carried this proximally and distally.  We dissected down the fracture site and found a lateral malleolus fracture, that was a Weber B fracture, but her bone was quite soft.  I used reduction forceps to reduce it and then we placed a 4-hole distal fibular Stryker plate that was anatomic fibular plate.  We secured this with bicortical screws proximally and locking screws distally and this did help the fracture reduced.  I then went to the medial side and made an incision over the medial malleolus.  I was able to clean the fracture debris and then reduced the medial malleolus piece.  I placed two temporary guidepins traversing that fracture under direct fluoroscopy as well.  I then drilled two partially-threaded 4.0- mm cancellous screws.  I then irrigated both wounds with normal saline solution and removed all guidepins and then closed the deep tissue with 2-0 Vicryl followed by 2-0 Vicryl in the subcutaneous tissue and interrupted 2-0 nylon on  both these skin incisions.  Xeroform and well- padded sterile dressing were applied and she was placed in a well-padded molded plaster splint.  She was then awakened, extubated, and taken to the recovery room in stable condition.  All final counts were correct. There were no complications noted.     Lind Guest. Ninfa Linden, M.D.     CYB/MEDQ  D:  08/24/2015  T:  08/24/2015  Job:  [id number]

## 2015-08-24 NOTE — Anesthesia Postprocedure Evaluation (Signed)
Anesthesia Post Note  Patient: Meghan [name]  Procedure(s) Performed: Procedure(s) (LRB): OPEN REDUCTION INTERNAL FIXATION (ORIF) LEFT ANKLE FRACTURE (Left)  Patient location during evaluation: PACU Anesthesia Type: General and Regional Level of consciousness: awake and alert Pain management: pain level controlled Vital Signs Assessment: post-procedure vital signs reviewed and stable Respiratory status: spontaneous breathing, nonlabored ventilation, respiratory function stable and patient connected to nasal cannula oxygen Cardiovascular status: blood pressure returned to baseline and stable Postop Assessment: no signs of nausea or vomiting Anesthetic complications: no    Last Vitals:  Filed Vitals:   08/24/15 1813 08/24/15 1815  BP: 139/80 139/80  Pulse: 101 102  Temp:    Resp: 17 22    Last Pain:  Filed Vitals:   08/24/15 1821  PainSc: 2     LLE Motor Response: Purposeful movement (08/24/15 1815) LLE Sensation: Full sensation (08/24/15 1815)          Geanie Pacifico S

## 2015-08-24 NOTE — Brief Op Note (Signed)
08/24/2015  5:25 PM  PATIENT:  [name]  70 y.o. female  PRE-OPERATIVE DIAGNOSIS:  left trimalleolar ankle fracture  POST-OPERATIVE DIAGNOSIS:  left trimalleolar ankle fracture  PROCEDURE:  Procedure(s) with comments: OPEN REDUCTION INTERNAL FIXATION (ORIF) LEFT ANKLE FRACTURE (Left) - General with a popliteal block  SURGEON:  Surgeon(s) and Role:    * Mcarthur Rossetti, MD - Primary  ANESTHESIA:   regional and general  EBL:  Total I/O In: 900 [I.V.:900] Out: 0    COUNTS:  YES  TOURNIQUET:   Total Tourniquet Time Documented: Thigh (Left) - 75 minutes Total: Thigh (Left) - 75 minutes   DICTATION: .Other Dictation: Dictation Number [id number]  PLAN OF CARE: Admit for overnight observation  PATIENT DISPOSITION:  PACU - hemodynamically stable.   Delay start of Pharmacological VTE agent (>24hrs) due to surgical blood loss or risk of bleeding: no

## 2015-08-24 NOTE — Anesthesia Preprocedure Evaluation (Signed)
Anesthesia Evaluation  Patient identified by MRN, date of birth, ID band Patient awake    Reviewed: Allergy & Precautions, NPO status , Patient's Chart, lab work & pertinent test results  Airway Mallampati: II  TM Distance: >3 FB Neck ROM: Full    Dental no notable dental hx.    Pulmonary neg pulmonary ROS,    Pulmonary exam normal breath sounds clear to auscultation       Cardiovascular negative cardio ROS Normal cardiovascular exam Rhythm:Regular Rate:Normal     Neuro/Psych negative neurological ROS  negative psych ROS   GI/Hepatic Neg liver ROS, GERD  Medicated,  Endo/Other  negative endocrine ROS  Renal/GU negative Renal ROS  negative genitourinary   Musculoskeletal negative musculoskeletal ROS (+)   Abdominal   Peds negative pediatric ROS (+)  Hematology negative hematology ROS (+)   Anesthesia Other Findings   Reproductive/Obstetrics negative OB ROS                             Anesthesia Physical Anesthesia Plan  ASA: II  Anesthesia Plan: General   Post-op Pain Management: GA combined w/ Regional for post-op pain   Induction: Intravenous  Airway Management Planned: LMA  Additional Equipment:   Intra-op Plan:   Post-operative Plan: Extubation in OR  Informed Consent: I have reviewed the patients History and Physical, chart, labs and discussed the procedure including the risks, benefits and alternatives for the proposed anesthesia with the patient or authorized representative who has indicated his/her understanding and acceptance.   Dental advisory given  Plan Discussed with: CRNA and Surgeon  Anesthesia Plan Comments:         Anesthesia Quick Evaluation  

## 2015-08-25 DIAGNOSIS — K219 Gastro-esophageal reflux disease without esophagitis: Secondary | ICD-10-CM | POA: Diagnosis not present

## 2015-08-25 DIAGNOSIS — M25572 Pain in left ankle and joints of left foot: Secondary | ICD-10-CM | POA: Diagnosis not present

## 2015-08-25 DIAGNOSIS — Z853 Personal history of malignant neoplasm of breast: Secondary | ICD-10-CM | POA: Diagnosis not present

## 2015-08-25 DIAGNOSIS — S82852A Displaced trimalleolar fracture of left lower leg, initial encounter for closed fracture: Secondary | ICD-10-CM | POA: Diagnosis not present

## 2015-08-25 MED ORDER — ASPIRIN EC 325 MG PO TBEC: 325.0000 mg | DELAYED_RELEASE_TABLET | Freq: Every day | ORAL | Status: DC

## 2015-08-25 MED ORDER — OXYCODONE HCL 5 MG PO TABS: 5.0000 mg | ORAL_TABLET | ORAL | Status: DC | PRN

## 2015-08-25 MED ORDER — METHOCARBAMOL 500 MG PO TABS: 500.0000 mg | ORAL_TABLET | Freq: Four times a day (QID) | ORAL | Status: DC | PRN

## 2015-08-25 NOTE — Progress Notes (Signed)
Subjective: Patient stable motor sensory function to starting to return   Objective: Vital signs in last 24 hours: Temp:  [97.7 F (36.5 C)-98.9 F (37.2 C)] 98.9 F (37.2 C) (04/22 0433) Pulse Rate:  [68-110] 68 (04/22 0433) Resp:  [11-22] 16 (04/22 0433) BP: (122-165)/(54-82) 122/54 mmHg (04/22 0433) SpO2:  [94 %-100 %] 96 % (04/22 0433) Weight:  [83.462 kg (184 lb)] 83.462 kg (184 lb) (04/21 1248)  Intake/Output from previous day: 04/21 0701 - 04/22 0700 In: 1500 [P.O.:600; I.V.:900] Out: 400 [Urine:400] Intake/Output this shift:    Exam:  Dorsiflexion/Plantar flexion intact  Labs:  Recent Labs  08/22/15 1020  HGB 13.1    Recent Labs  08/22/15 1020  WBC 15.3*  RBC 4.27  HCT 38.5  PLT 210    Recent Labs  08/22/15 1020  NA 139  K 4.3  CL 105  CO2 24  BUN 21*  CREATININE 1.08*  GLUCOSE 122*  CALCIUM 9.6   No results for input(s): LABPT, INR in the last 72 hours.  Assessment/Plan: Plan discharge today nonweightbearing left ankle knee scooter okay to use for mobilization   Meghan [name] SCOTT 08/25/2015, 7:17 AM

## 2015-08-25 NOTE — Evaluation (Addendum)
Physical Therapy Evaluation Patient Details Name: Meghan [name] MRN: [id number] DOB: Jul 17, 1945 Today's Date: 08/25/2015   History of Present Illness  L ankle fx, s/p ORIF; PMH of restless leg syndrome, breast cancer  Clinical Impression  Pt is modified independent with mobility, she ambulated 300' on knee scooter without loss of balance. Instructed pt in L hip/knee strengthening exercises, pt demonstrated good understanding.  No further acute PT needs.  From PT standpoint she is ready to DC home.     Follow Up Recommendations No PT follow up    Equipment Recommendations  None recommended by PT    Recommendations for Other Services       Precautions / Restrictions Precautions Precautions: Fall Restrictions Weight Bearing Restrictions: Yes LLE Weight Bearing: Non weight bearing      Mobility  Bed Mobility Overal bed mobility: Independent                Transfers Overall transfer level: Modified independent Equipment used:  (knee scooter)                Ambulation/Gait Ambulation/Gait assistance: Modified independent (Device/Increase time) Ambulation Distance (Feet): 300 Feet Assistive device:  (knee scooter)       General Gait Details: NWB LLE, pt able to navigate turns and obstacles with knee scooter  Stairs Stairs:  (pt declined stair training, stated she's been doing stairs with crutches for a week and doesn't need to practice)          Wheelchair Mobility    Modified Rankin (Stroke Patients Only)       Balance Overall balance assessment: Modified Independent                                           Pertinent Vitals/Pain Pain Assessment: No/denies pain  LLE elevated, ice applied    Home Living Family/patient expects to be discharged to:: Private residence Living Arrangements: Spouse/significant other Available Help at Discharge: Family   Home Access: Stairs to enter Entrance Stairs-Rails: Right Entrance  Stairs-Number of Steps: 4 Home Layout: One level Home Equipment: Crutches;Shower seat (knee scooter)      Prior Function Level of Independence: Independent         Comments: works 2 days/week at cardiology office, pt is a triage nurse     Hand Dominance        Extremity/Trunk Assessment   Upper Extremity Assessment: Overall WFL for tasks assessed           Lower Extremity Assessment: LLE deficits/detail   LLE Deficits / Details: sensation to light touch is returning in toes, able to wiggle toes, knee/hip strength/ROM WNL  Cervical / Trunk Assessment: Normal  Communication   Communication: No difficulties  Cognition Arousal/Alertness: Awake/alert Behavior During Therapy: WFL for tasks assessed/performed Overall Cognitive Status: Within Functional Limits for tasks assessed                      General Comments      Exercises  Instructed pt in quad sets, glut sets, standing L hip ABD, ext, flexion, LAQs for HEP, handout issued.       Assessment/Plan    PT Assessment Patent does not need any further PT services  PT Diagnosis Difficulty walking   PT Problem List    PT Treatment Interventions     PT Goals (Current goals can be found  in the Care Plan section) Acute Rehab PT Goals Patient Stated Goal: return to work as nurse PT Goal Formulation: All assessment and education complete, DC therapy    Frequency     Barriers to discharge        Co-evaluation               End of Session Equipment Utilized During Treatment: Gait belt Activity Tolerance: Patient tolerated treatment well Patient left: in chair;with call bell/phone within reach;with family/visitor present Nurse Communication: Mobility status         Time: [id number] PT Time Calculation (min) (ACUTE ONLY): 28 min   Charges:   PT Evaluation $PT Eval Low Complexity: 1 Procedure PT Treatments $Gait Training: 8-22 mins   PT G Codes:  PT G-Codes **NOT FOR INPATIENT CLASS**  Functional Assessment Tool Used clinical judgement clinical judgement at 0848 on 08/25/15 by Lucile Crater, PT Functional Limitation Mobility: Walking and moving around Mobility: Walking and moving around at 0848 on 08/25/15 by Lucile Crater, PT Mobility: Walking and Moving Around Current Status [phone]) At least 1 percent but less than 20 percent impaired, limited or restricted CI at 0848 on 08/25/15 by Lucile Crater, PT Mobility: Walking and Moving Around Goal Status [phone]) At least 1 percent but less than 20 percent impaired, limited or restricted CI at 0848 on 08/25/15 by Lucile Crater, PT Mobility: Walking and Moving Around Discharge Status [phone]) At least 1 percent but less than 20 percent impaired, limited or restricted        Philomena Doheny 08/25/2015, 8:48 AM [phone]

## 2015-08-25 NOTE — Progress Notes (Signed)
Discharged from floor via w/c for transport to home, belongings & spouse with pt. No changes in assessment. Lashann Hagg, [employer]

## 2015-08-25 NOTE — Care Management Note (Signed)
Case Management Note  Patient Details  Name: Meghan [name] MRN: [id number] Date of Birth: Sep 22, 1945  Subjective/Objective:         Open reduction and internal fixation of left trimalleolar ankle fracture           Action/Plan: Discharge Planning: AVS reviewed:  NCM spoke to pt. She declines HH PT. PT did not recommend HH PT. States she has knee scooter. Husband at home to assist with care.   Expected Discharge Date:  08/25/2015              Expected Discharge Plan:  Home/Self Care  In-House Referral:  NA  Discharge planning Services  CM Consult  Post Acute Care Choice:  NA Choice offered to:  NA  DME Arranged:  N/A DME Agency:  NA  HH Arranged:  Patient Refused [place] Agency:  NA  Status of Service:  Completed, signed off  Medicare Important Message Given:    Date Medicare IM Given:    Medicare IM give by:    Date Additional Medicare IM Given:    Additional Medicare Important Message give by:     If discussed at [place] of Stay Meetings, dates discussed:    Additional Comments:  Erenest Rasher, RN 08/25/2015, 10:11 AM

## 2015-08-27 ENCOUNTER — Encounter (HOSPITAL_COMMUNITY): Payer: Self-pay | Admitting: Orthopaedic Surgery

## 2015-08-27 NOTE — Addendum Note (Signed)
Addendum  created 08/27/15 [id number] by Lollie Sails, CRNA   Modules edited: Charges VN

## 2015-09-06 DIAGNOSIS — S82852D Displaced trimalleolar fracture of left lower leg, subsequent encounter for closed fracture with routine healing: Secondary | ICD-10-CM | POA: Diagnosis not present

## 2015-09-27 ENCOUNTER — Encounter: Payer: Self-pay | Admitting: Internal Medicine

## 2015-09-27 ENCOUNTER — Other Ambulatory Visit: Payer: Self-pay

## 2015-09-27 ENCOUNTER — Other Ambulatory Visit: Payer: Self-pay | Admitting: Family Medicine

## 2015-09-27 DIAGNOSIS — R911 Solitary pulmonary nodule: Secondary | ICD-10-CM

## 2015-09-27 MED ORDER — PANTOPRAZOLE SODIUM 40 MG PO TBEC: 40.0000 mg | DELAYED_RELEASE_TABLET | Freq: Every day | ORAL | Status: DC

## 2015-10-03 DIAGNOSIS — G629 Polyneuropathy, unspecified: Secondary | ICD-10-CM | POA: Diagnosis not present

## 2015-10-03 DIAGNOSIS — K635 Polyp of colon: Secondary | ICD-10-CM | POA: Diagnosis not present

## 2015-10-03 DIAGNOSIS — Z79899 Other long term (current) drug therapy: Secondary | ICD-10-CM | POA: Diagnosis not present

## 2015-10-03 DIAGNOSIS — K219 Gastro-esophageal reflux disease without esophagitis: Secondary | ICD-10-CM | POA: Diagnosis not present

## 2015-10-03 DIAGNOSIS — N189 Chronic kidney disease, unspecified: Secondary | ICD-10-CM | POA: Diagnosis not present

## 2015-10-03 DIAGNOSIS — M899 Disorder of bone, unspecified: Secondary | ICD-10-CM | POA: Diagnosis not present

## 2015-10-03 DIAGNOSIS — R899 Unspecified abnormal finding in specimens from other organs, systems and tissues: Secondary | ICD-10-CM | POA: Diagnosis not present

## 2015-10-03 DIAGNOSIS — Z853 Personal history of malignant neoplasm of breast: Secondary | ICD-10-CM | POA: Diagnosis not present

## 2015-10-03 DIAGNOSIS — M8580 Other specified disorders of bone density and structure, unspecified site: Secondary | ICD-10-CM | POA: Diagnosis not present

## 2015-10-04 ENCOUNTER — Encounter: Payer: Self-pay | Admitting: Neurology

## 2015-10-04 ENCOUNTER — Ambulatory Visit (INDEPENDENT_AMBULATORY_CARE_PROVIDER_SITE_OTHER): Payer: Medicare Other | Admitting: Neurology

## 2015-10-04 VITALS — BP 124/68 | HR 80 | Ht 64.0 in | Wt 172.0 lb

## 2015-10-04 DIAGNOSIS — G2581 Restless legs syndrome: Secondary | ICD-10-CM | POA: Diagnosis not present

## 2015-10-04 MED ORDER — PRAMIPEXOLE DIHYDROCHLORIDE 0.25 MG PO TABS: 0.2500 mg | ORAL_TABLET | Freq: Every day | ORAL | Status: DC

## 2015-10-04 NOTE — Progress Notes (Signed)
Reason for visit: Restless leg syndrome  Meghan [name] is an 70 y.o. female  History of present illness:  Meghan [name] is a 70 year old right-handed white female with a history of restless leg syndrome. The symptoms are primarily at nighttime. She has been seen initially for paresthesias on all fours, but this issue has completely resolved. The patient has been on gabapentin for the restless legs, this has not been completely effective. She takes 600 mg at night but still has restless leg symptoms. She fell 6 weeks ago and has had compound fractures of the left lower leg requiring surgery. The patient is recovering from this, she has not yet weightbearing on the left leg. The patient returns this office for an evaluation. She is having some pain around the surgical site.  Past Medical History  Diagnosis Date  . Esophageal stricture   . GERD (gastroesophageal reflux disease)   . Diverticulosis of colon (without mention of hemorrhage)   . Colon polyps     Hyperplastic   . Restless legs syndrome (RLS)   . Restless leg syndrome 03/27/2014  . Toe pain, right 09/2014    for 2 wks  . 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 Numbness     feet bilat at nighttime  . History of urinary tract infection   . Urinary urgency   . Arthritis   . History of radiation therapy     Past Surgical History  Procedure Laterality Date  . Breast surgery      left 2006  . Cesarean section    . Orif ankle fracture Left 08/24/2015    Procedure: OPEN REDUCTION INTERNAL FIXATION (ORIF) LEFT ANKLE FRACTURE;  Surgeon: Mcarthur Rossetti, MD;  Location: [facility];  Service: Orthopedics;  Laterality: Left;  General with a popliteal block    Family History  Problem Relation Age of Onset  . COPD Father   . Heart disease Father   . Emphysema Father     smoker  . Breast cancer Sister 46    mastectomy; metastasis to liver and lung  . Heart disease Maternal Grandmother    . Congestive Heart Failure Maternal Grandmother   . Heart disease Paternal Grandmother   . Obesity Brother   . Heart attack Mother   . Neuropathy Neg Hx   . Breast cancer Maternal Aunt     dx. late 50s  . Alzheimer's disease Maternal Aunt   . Breast cancer Maternal Aunt     dx. 68s  . Squamous cell carcinoma Maternal Aunt     recently removed; dx. 95  . Breast cancer Cousin 62    genetic testing in approx 2015  . Ovarian cancer Cousin     dx. 82s  . Esophageal cancer Cousin     not a smoker    Social history:  reports that she has never smoked. She has never used smokeless tobacco. She reports that she drinks alcohol. She reports that she does not use illicit drugs.   No Known Allergies  Medications:  Prior to Admission medications   Medication Sig Start Date End Date Taking? Authorizing Provider  aspirin EC 325 MG tablet Take 1 tablet (325 mg total) by mouth daily. 08/25/15   Meredith Pel, MD  Calcium Citrate (CITRACAL PO) Take 1,200 mg by mouth daily.     Historical Provider, MD  Cholecalciferol (VITAMIN D) 2000 units tablet Take 2,000 Units by mouth daily.  Historical Provider, MD  clidinium-chlordiazePOXIDE (LIBRAX) 5-2.5 MG per capsule Take 1 capsule by mouth as needed. Patient taking differently: Take 1 capsule by mouth every 6 (six) hours as needed (for divertulitis flares).  12/08/14 01/28/16  Irene Shipper, MD  FIBER SELECT GUMMIES PO Take 2 tablets by mouth at bedtime.     Historical Provider, MD  fluticasone (FLONASE) 50 MCG/ACT nasal spray Place 1 spray into both nostrils daily as needed for allergies or rhinitis.     Historical Provider, MD  gabapentin (NEURONTIN) 100 MG capsule Take 17m po three times daily  and 3086mpohs Patient taking differently: Take 600 mg by mouth at bedtime.  04/03/15   NaDennie BibleNP  methocarbamol (ROBAXIN) 500 MG tablet Take 1 tablet (500 mg total) by mouth every 6 (six) hours as needed for muscle spasms. 08/25/15   ScMeredith PelMD  Misc Natural Products (OSTEO BI-FLEX ADV TRIPLE ST) TABS Take 1 tablet by mouth 2 (two) times daily.    Historical Provider, MD  Multiple Vitamin (MULTIVITAMIN) tablet Take 1 tablet by mouth every evening.     Historical Provider, MD  NEXIUM 40 MG capsule TAKE 1 CAPSULE DAILY 07/31/15   JoIrene ShipperMD  oxyCODONE (OXY IR/ROXICODONE) 5 MG immediate release tablet Take 1-3 tablets (5-15 mg total) by mouth every 3 (three) hours as needed for breakthrough pain. 08/25/15   ScMeredith PelMD  pantoprazole (PROTONIX) 40 MG tablet Take 1 tablet (40 mg total) by mouth daily. 09/27/15   JoIrene ShipperMD    ROS:  Out of a complete 14 system review of symptoms, the patient complains only of the following symptoms, and all other reviewed systems are negative.  Restless legs  Blood pressure 124/68, pulse 80, height _0  (1.626 m), weight 172 lb (78.019 kg).  Physical Exam  General: The patient is alert and cooperative at the time of the examination.  Skin: No significant peripheral edema is noted.   Neurologic Exam  Mental status: The patient is alert and oriented x 3 at the time of the examination. The patient has apparent normal recent and remote memory, with an apparently normal attention span and concentration ability.   Cranial nerves: Facial symmetry is present. Speech is normal, no aphasia or dysarthria is noted. Extraocular movements are full. Visual fields are full.  Motor: The patient has good strength in all 4 extremities.  Sensory examination: Soft touch sensation is symmetric on the face, arms, and legs.  Coordination: The patient has good finger-nose-finger and heel-to-shin bilaterally.  Gait and station: The patient could not be ambulated secondary to the left leg fracture.  Reflexes: Deep tendon reflexes are symmetric, but could not test the left ankle jerk.   Assessment/Plan:  1. Restless leg syndrome  The patient will begin a trial on Mirapex in  low dose. If this is effective, eventually the gabapentin may be tapered off and discontinued. The patient will follow-up in 6 months, sooner if needed. The patient will contact our office regarding the use of the Mirapex.  C.Jill AlexandersD 10/04/2015 10:33 AM  [facility] [address] [address]Phone [phone]

## 2015-10-04 NOTE — Patient Instructions (Signed)
Restless Legs Syndrome Restless legs syndrome is a condition that causes uncomfortable feelings or sensations in the legs, especially while sitting or lying down. The sensations usually cause an overwhelming urge to move the legs. The arms can also sometimes be affected. The condition can range from mild to severe. The symptoms often interfere with a person's ability to sleep. CAUSES The cause of this condition is not known. RISK FACTORS This condition is more likely to develop in:  People who are older than age 50.  Pregnant women. In general, restless legs syndrome is more common in women than in men.  People who have a family history of the condition.  People who have certain medical conditions, such as iron deficiency, kidney disease, Parkinson disease, or nerve damage.  People who take certain medicines, such as medicines for high blood pressure, nausea, colds, allergies, depression, and some heart conditions. SYMPTOMS The main symptom of this condition is uncomfortable sensations in the legs. These sensations may be:  Described as pulling, tingling, prickling, throbbing, crawling, or burning.  Worse while you are sitting or lying down.  Worse during periods of rest or inactivity.  Worse at night, often interfering with your sleep.  Accompanied by a very strong urge to move your legs.  Temporarily relieved by movement of your legs. The sensations usually affect both sides of the body. The arms can also be affected, but this is rare. People who have this condition often have tiredness during the day because of their lack of sleep at night. DIAGNOSIS This condition may be diagnosed based on your description of the symptoms. You may also have tests, including blood tests, to check for other conditions that may lead to your symptoms. In some cases, you may be asked to spend some time in a sleep lab so your sleeping can be monitored. TREATMENT Treatment for this condition is  focused on managing the symptoms. Treatment may include:  Self-help and lifestyle changes.  Medicines. HOME CARE INSTRUCTIONS  Take medicines only as directed by your health care provider.  Try these methods to get temporary relief from the uncomfortable sensations:  Massage your legs.  Walk or stretch.  Take a cold or hot bath.  Practice good sleep habits. For example, go to bed and get up at the same time every day.  Exercise regularly.  Practice ways of relaxing, such as yoga or meditation.  Avoid caffeine and alcohol.  Do not use any tobacco products, including cigarettes, chewing tobacco, or electronic cigarettes. If you need help quitting, ask your health care provider.  Keep all follow-up visits as directed by your health care provider. This is important. SEEK MEDICAL CARE IF: Your symptoms do not improve with treatment, or they get worse.   This information is not intended to replace advice given to you by your health care provider. Make sure you discuss any questions you have with your health care provider.   Document Released: 04/11/2002 Document Revised: 09/05/2014 Document Reviewed: 04/17/2014 Elsevier Interactive Patient Education 2016 Elsevier Inc.  

## 2015-10-05 ENCOUNTER — Ambulatory Visit
Admission: RE | Admit: 2015-10-05 | Discharge: 2015-10-05 | Disposition: A | Discharge status: Home / Self Care | Payer: Medicare Other | Source: Ambulatory Visit | Attending: Family Medicine | Admitting: Family Medicine

## 2015-10-05 DIAGNOSIS — R911 Solitary pulmonary nodule: Secondary | ICD-10-CM

## 2015-10-08 DIAGNOSIS — S82852D Displaced trimalleolar fracture of left lower leg, subsequent encounter for closed fracture with routine healing: Secondary | ICD-10-CM | POA: Diagnosis not present

## 2015-10-10 ENCOUNTER — Telehealth: Payer: Self-pay | Admitting: Pulmonary Disease

## 2015-10-10 ENCOUNTER — Telehealth: Payer: Self-pay | Admitting: Cardiothoracic Surgery

## 2015-10-10 NOTE — Telephone Encounter (Signed)
Asked by the patient to review her recent CT of the chest and chest xray. Festus Barren

## 2015-10-10 NOTE — Telephone Encounter (Signed)
Scheduled sooner appt with Dr. Angelica Ran

## 2015-10-11 ENCOUNTER — Telehealth: Payer: Self-pay | Admitting: Pulmonary Disease

## 2015-10-11 NOTE — Telephone Encounter (Signed)
  Harland German, MA Medical Assistant Signed  Service date: 10/10/2015 3:23 PM              Appointment Request   Sabelle Lamkins   MRN:  [id number]  DOB:  11/06/1942     Sent:  Mon October 08, 2015 9:27 PM   Pt Home:  [phone]       To:  [facility] Support Pool   Entered:  [phone]      Message     Appointment Request From: Celso Amy         With Provider: Rigoberto Noel., MD [[facility]]         Preferred Date Range: From 10/08/2015 To 11/05/2015         Reason for visit: Office Visit         Comments:     Dr. Elsworth Soho,         I'm sending you a note on behalf of my wife Omolola Huckins, DOB 1945-12-09. She recently had a chest x-ray on 19 April prior to ankle surgery, by Dr. Ninfa Linden, on 21 April. The x-ray showed a modular density in her right lung and our primary care physician, Dr. Hulan Fess, ordered a CT scan that was made on 2 June.         Dr. Rex Kras called her today and recommended that she see a pulmonologist for a needle biopsy of the nodule. The earliest appointment Dr. Eddie Dibbles office could make for her to see you is 2 August at 11:30.         Would it be possible for you to see her at an earlier date?         Dr Rex Kras sent you a copy of Lanissa's records this afternoon.         Best Regards,     Dartha Lodge

## 2015-10-11 NOTE — Telephone Encounter (Signed)
Pt scheduled for a Consult with Dr Elsworth Soho 10/12/15 at 10am per previous telephone note 10/10/15 Nothing further needed.

## 2015-10-12 ENCOUNTER — Telehealth: Payer: Self-pay | Admitting: Pulmonary Disease

## 2015-10-12 ENCOUNTER — Encounter: Payer: Self-pay | Admitting: Pulmonary Disease

## 2015-10-12 ENCOUNTER — Ambulatory Visit (INDEPENDENT_AMBULATORY_CARE_PROVIDER_SITE_OTHER): Payer: Medicare Other | Admitting: Pulmonary Disease

## 2015-10-12 VITALS — BP 112/80 | HR 85 | Ht 64.0 in | Wt 171.0 lb

## 2015-10-12 DIAGNOSIS — R911 Solitary pulmonary nodule: Secondary | ICD-10-CM | POA: Insufficient documentation

## 2015-10-12 NOTE — Telephone Encounter (Signed)
lmtcb

## 2015-10-12 NOTE — Assessment & Plan Note (Signed)
Suspicious for a solitary metastasis in this never smoker Given the large size we'll proceed with PET scan rather than wait and watch approach Further steps-resection versus biopsy based on results of PET scan She would like to see Dr. Servando Snare

## 2015-10-12 NOTE — Progress Notes (Signed)
Subjective:    Patient ID: Meghan [name], female    DOB: December 25, 1945, 70 y.o.   MRN: [id number]  HPI  Chief Complaint  Patient presents with  . PULMONARY CONSULT    Referred by Dr Rex Kras for new lung nodule. Recent CXR and CT. Denies any cough, wheeze and SOB.   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 She has a remote history of breast cancer in 2006 that required lumpectomy and radiation and tamoxifen for 5 years. Her serial mammograms have been negative since. She also has an idiopathic peripheral neuropathy and restless leg syndromes for which she sees Dr. Jannifer Franklin. She has a history of esophageal stricture that has been dilated and she takes Nexium She had an accidental fall with a left ankle fracture that required internal fixation. She also hurt her chest and had chest pain for which she obtained a chest x-ray that suggested a right lower lobe nodule. CT chest confirmed [phone] cm nodule in the right lower lobe abutting the pleura and a small 3 mm nodule in the left lung. She is currently asymptomatic her chest pain is resolved, she denies cough or dyspnea or weight loss.    Past Medical History  Diagnosis Date  . Esophageal stricture   . GERD (gastroesophageal reflux disease)   . Diverticulosis of colon (without mention of hemorrhage)   . Colon polyps     Hyperplastic   . Restless legs syndrome (RLS)   . Restless leg syndrome 03/27/2014  . Toe pain, right 09/2014    for 2 wks  . 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 Numbness     feet bilat at nighttime  . History of urinary tract infection   . Urinary urgency   . Arthritis   . History of radiation therapy     Past Surgical History  Procedure Laterality Date  . Breast surgery      left 2006  . Cesarean section    . Orif ankle fracture Left 08/24/2015    Procedure: OPEN REDUCTION INTERNAL FIXATION (ORIF)  LEFT ANKLE FRACTURE;  Surgeon: Mcarthur Rossetti, MD;  Location: [facility];  Service: Orthopedics;  Laterality: Left;  General with a popliteal block   Allergies  Allergen Reactions  . Nsaids Other (See Comments)    CKD - per PCP     Social History   Social History  . Marital Status: Married    Spouse Name: N/A  . Number of Children: 2  . Years of Education: AS   Occupational History  . RN     part-time  . Retired    Social History Main Topics  . Smoking status: Never Smoker   . Smokeless tobacco: Never Used  . Alcohol Use: 0.0 oz/week    0 Standard drinks or equivalent per week     Comment: very rarely - wine  . Drug Use: No  . Sexual Activity: Yes   Other Topics Concern  . Not on file   Social History Narrative   Lives at home w/ her husband   Right-handed   Occasionally drinks tea     Family History  Problem Relation Age of Onset  . COPD Father   . Heart disease Father   . Emphysema Father     smoker  . Breast cancer Sister 85    mastectomy; metastasis to liver and lung  . Heart disease  Maternal Grandmother   . Congestive Heart Failure Maternal Grandmother   . Heart disease Paternal Grandmother   . Obesity Brother   . Heart attack Mother   . Neuropathy Neg Hx   . Breast cancer Maternal Aunt     dx. late 41s  . Alzheimer's disease Maternal Aunt   . Breast cancer Maternal Aunt     dx. 29s  . Squamous cell carcinoma Maternal Aunt     recently removed; dx. 95  . Breast cancer Cousin 62    genetic testing in approx 2015  . Ovarian cancer Cousin     dx. 43s  . Esophageal cancer Cousin     not a smoker    Review of Systems  Constitutional: Negative for fever and unexpected weight change.  HENT: Negative for congestion, dental problem, ear pain, nosebleeds, postnasal drip, rhinorrhea, sinus pressure, sneezing, sore throat and trouble swallowing.   Eyes: Negative for redness and itching.  Respiratory: Negative for cough, chest tightness, shortness  of breath and wheezing.   Cardiovascular: Negative for palpitations and leg swelling.  Gastrointestinal: Negative for nausea and vomiting.  Genitourinary: Negative for dysuria.  Musculoskeletal: Negative for joint swelling.  Skin: Negative for rash.  Neurological: Negative for headaches.  Hematological: Does not bruise/bleed easily.  Psychiatric/Behavioral: Negative for dysphoric mood. The patient is not nervous/anxious.        Objective:   Physical Exam  Gen. Pleasant, well-nourished, in no distress, normal affect ENT - no lesions, no post nasal drip Neck: No JVD, no thyromegaly, no carotid bruits Lungs: no use of accessory muscles, no dullness to percussion, clear without rales or rhonchi  Cardiovascular: Rhythm regular, heart sounds  normal, no murmurs or gallops, no peripheral edema Abdomen: soft and non-tender, no hepatosplenomegaly, BS normal. Musculoskeletal: No deformities, no cyanosis or clubbing Neuro:  alert, non focal       Assessment & Plan:

## 2015-10-12 NOTE — Patient Instructions (Addendum)
PET scan - further steps based on this

## 2015-10-15 NOTE — Telephone Encounter (Signed)
Spoke with pt. She wanted to make sure that her OV note got sent to her PCP. States that this was already sent and Dr. Rex Kras did receive it. Nothing further was needed.

## 2015-10-18 ENCOUNTER — Institutional Professional Consult (permissible substitution) (INDEPENDENT_AMBULATORY_CARE_PROVIDER_SITE_OTHER): Payer: Medicare Other | Admitting: Cardiothoracic Surgery

## 2015-10-18 ENCOUNTER — Encounter (HOSPITAL_COMMUNITY)
Admission: RE | Admit: 2015-10-18 | Discharge: 2015-10-18 | Disposition: A | Discharge status: Home / Self Care | Payer: Medicare Other | Source: Ambulatory Visit | Attending: Pulmonary Disease | Admitting: Pulmonary Disease

## 2015-10-18 ENCOUNTER — Encounter: Payer: Self-pay | Admitting: Cardiothoracic Surgery

## 2015-10-18 ENCOUNTER — Other Ambulatory Visit: Payer: Self-pay | Admitting: *Deleted

## 2015-10-18 VITALS — BP 158/82 | HR 106 | Resp 16 | Ht 64.0 in | Wt 171.0 lb

## 2015-10-18 DIAGNOSIS — R911 Solitary pulmonary nodule: Secondary | ICD-10-CM | POA: Diagnosis not present

## 2015-10-18 DIAGNOSIS — E041 Nontoxic single thyroid nodule: Secondary | ICD-10-CM

## 2015-10-18 DIAGNOSIS — D381 Neoplasm of uncertain behavior of trachea, bronchus and lung: Secondary | ICD-10-CM | POA: Diagnosis not present

## 2015-10-18 LAB — GLUCOSE, CAPILLARY: GLUCOSE-CAPILLARY: 103 mg/dL — AB (ref 65–99)

## 2015-10-18 MED ORDER — FLUDEOXYGLUCOSE F - 18 (FDG) INJECTION
8.5100 | Freq: Once | INTRAVENOUS | Status: AC | PRN
  Administered 2015-10-18: 8.51 via INTRAVENOUS

## 2015-10-18 NOTE — Progress Notes (Signed)
[address]       [place] [address]             [phone]                    Kiah E Dineen [facility]wood Medical Record #[id number] Date of Birth: [birth year]  Referring: Rigoberto Noel, MD Primary Care: Gennette Pac, MD  Chief Complaint:    Chief Complaint  Patient presents with  . Lung Lesion    RLLobe .Marland KitchenMarland KitchenCT CHEST and PET    History of Present Illness:    Meghan [name] 70 y.o. female is seen in the office  today for Abnormal CT PET scan. Patient originally had injury in April when she fell fracturing her right ankle and landing on her right lower chest. Following this she noted some pleuritic chest pain on the right. Because of this she requested a chest x-ray to be done at her preop visit for the repair of her left ankle fracture. The chest x-ray suggested an anterior mass. Last week a CT scan of the chest was performed to follow-up on this and showed a pleural-based approximate 2 cm in size, CT scan did not suggest any other evidence of lung malignancy. 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   She has been a lifelong nonsmoker, in the past both of her parents smoked and her husband smoked in the past.  Currently she has walking with crutches and a boot on her left ankle .   Current Activity/ Functional Status:  Patient is independent with mobility/ambulation, transfers, ADL's, IADL's. With some help and with crutches due to recent injury   Zubrod Score: At the time of surgery this patient's most appropriate activity status/level should be described as: []     0    Normal activity, no symptoms [x]     1    Restricted in physical strenuous activity but ambulatory, able to do out light work []     2    Ambulatory and capable of self care, unable to do work activities, up and about               >50 % of waking hours                              []     3    Only limited self care, in  bed greater than 50% of waking hours []     4    Completely disabled, no self care, confined to bed or chair []     5    Moribund   Past Medical History  Diagnosis Date  . Esophageal stricture   . GERD (gastroesophageal reflux disease)   . Diverticulosis of colon (without mention of hemorrhage)   . Colon polyps     Hyperplastic   . Restless legs syndrome (RLS)   . Restless leg syndrome 03/27/2014  . Toe pain, right 09/2014    for 2 wks  . 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 Numbness     feet bilat at nighttime  . History of urinary tract infection   . Urinary urgency   . Arthritis   . History of radiation therapy     Past Surgical History  Procedure Laterality Date  . Breast surgery  left 2006  . Cesarean section    . Orif ankle fracture Left 08/24/2015    Procedure: OPEN REDUCTION INTERNAL FIXATION (ORIF) LEFT ANKLE FRACTURE;  Surgeon: Mcarthur Rossetti, MD;  Location: [facility];  Service: Orthopedics;  Laterality: Left;  General with a popliteal block    Family History  Problem Relation Age of Onset  . COPD Father   . Heart disease Father   . Emphysema Father     smoker  . Breast cancer Sister 74    mastectomy; metastasis to liver and lung  . Heart disease Maternal Grandmother   . Congestive Heart Failure Maternal Grandmother   . Heart disease Paternal Grandmother   . Obesity Brother   . Heart attack Mother   . Neuropathy Neg Hx   . Breast cancer Maternal Aunt     dx. late 36s  . Alzheimer's disease Maternal Aunt   . Breast cancer Maternal Aunt     dx. 46s  . Squamous cell carcinoma Maternal Aunt     recently removed; dx. 95  . Breast cancer Cousin 62    genetic testing in approx 2015  . Ovarian cancer Cousin     dx. 68s  . Esophageal cancer Cousin     not a smoker    Social History   Social History  . Marital Status: Married    Spouse Name: N/A  . Number of Children: 2  . Years of Education: AS    Occupational History  . RN     part-time  . Retired    Social History Main Topics  . Smoking status: Never Smoker   . Smokeless tobacco: Never Used  . Alcohol Use: 0.0 oz/week    0 Standard drinks or equivalent per week     Comment: very rarely - wine  . Drug Use: No  . Sexual Activity: Yes   Other Topics Concern  . Not on file   Social History Narrative   Lives at home w/ her husband   Right-handed   Occasionally drinks tea    History  Smoking status  . Never Smoker   Smokeless tobacco  . Never Used    History  Alcohol Use  . 0.0 oz/week  . 0 Standard drinks or equivalent per week    Comment: very rarely - wine     Allergies  Allergen Reactions  . Nsaids Other (See Comments)    CKD - per PCP    Current Outpatient Prescriptions  Medication Sig Dispense Refill  . Calcium Citrate (CITRACAL PO) Take 1,200 mg by mouth daily.     . Cholecalciferol (VITAMIN D3) 2000 units TABS Take 1 tablet by mouth daily.    . clidinium-chlordiazePOXIDE (LIBRAX) 5-2.5 MG per capsule Take 1 capsule by mouth as needed. (Patient taking differently: Take 1 capsule by mouth every 6 (six) hours as needed (for divertulitis flares). ) 100 capsule 3  . FIBER SELECT GUMMIES PO Take 2 tablets by mouth at bedtime.     . fluticasone (FLONASE) 50 MCG/ACT nasal spray Place 1 spray into both nostrils daily as needed for allergies or rhinitis.     Marland Kitchen gabapentin (NEURONTIN) 100 MG capsule Take 174m po three times daily  and 3033mpohs (Patient taking differently: Take 600 mg by mouth at bedtime. ) 540 capsule 2  . Misc Natural Products (OSTEO BI-FLEX ADV TRIPLE ST) TABS Take 1 tablet by mouth 2 (two) times daily.    . Multiple Vitamin (MULTIVITAMIN) tablet Take 1 tablet  by mouth every evening.     Marland Kitchen NEXIUM 40 MG capsule TAKE 1 CAPSULE DAILY 90 capsule 1  . Omega-3 Fatty Acids (FISH OIL) 1200 MG CAPS Take 1 capsule by mouth daily.    Marland Kitchen oxyCODONE (OXY IR/ROXICODONE) 5 MG immediate release tablet  Take 1-3 tablets (5-15 mg total) by mouth every 3 (three) hours as needed for breakthrough pain. 60 tablet 0  . pramipexole (MIRAPEX) 0.25 MG tablet Take 1 tablet (0.25 mg total) by mouth at bedtime. 30 tablet 1  . pantoprazole (PROTONIX) 40 MG tablet Take 1 tablet (40 mg total) by mouth daily. (Patient not taking: Reported on 10/12/2015) 90 tablet 3   No current facility-administered medications for this visit.      Review of Systems:     Cardiac Review of Systems: Y or N  Chest Pain Florencio.Farrier    ]  Resting SOB [ n  ] Exertional SOB  [ n ]  Orthopnea [ n ]   Pedal Edema [ n  ]    Palpitations [ n ] Syncope  [ n ]   Presyncope [ n  ]  General Review of Systems: [Y] = yes [  ]=no Constitional: recent weight change [  ];  Wt loss over the last 3 months [   ] anorexia [  ]; fatigue [  ]; nausea [  ]; night sweats [  ]; fever [  ]; or chills [  ];          Dental: poor dentition[  ]; Last Dentist visit:   Eye : blurred vision [  ]; diplopia [   ]; vision changes [  ];  Amaurosis fugax[  ]; Resp: cough [  ];  wheezing[  ];  hemoptysis[  ]; shortness of breath[  ]; paroxysmal nocturnal dyspnea[  ]; dyspnea on exertion[  ]; or orthopnea[  ];  GI:  gallstones[  ], vomiting[  ];  dysphagia[  ]; melena[  ];  hematochezia [  ]; heartburn[  ];   Hx of  Colonoscopy[  ]; GU: kidney stones [  ]; hematuria[  ];   dysuria [  ];  nocturia[  ];  history of     obstruction [  ]; urinary frequency [  ]             Skin: rash, swelling[  ];, hair loss[  ];  peripheral edema[  ];  or itching[  ]; Musculosketetal: myalgias[  ];  joint swelling[  ];  joint erythema[  ];  joint pain[  ];  back pain[  ];  Heme/Lymph: bruising[  ];  bleeding[  ];  anemia[  ];  Neuro: TIA[  ];  headaches[  ];  stroke[  ];  vertigo[  ];  seizures[  ];   paresthesias[  ];  difficulty walking[ y ];  Psych:depression[  ]; anxiety[  ];  Endocrine: diabetes[  ];  thyroid dysfunction[  ];  Immunizations: Flu up to date [ y ]; Pneumococcal up to date  [ y ];  Other:  Physical Exam: BP 158/82 mmHg  Pulse 106  Resp 16  Ht 5' 4"  (1.626 m)  Wt 171 lb (77.565 kg)  BMI 29.34 kg/m2  SpO2 98%  PHYSICAL EXAMINATION: General appearance: alert, cooperative and appears stated age Head: Normocephalic, without obvious abnormality, atraumatic Neck: no adenopathy, no carotid bruit, no JVD, supple, symmetrical, trachea midline and thyroid not enlarged, symmetric, no tenderness/mass/nodules Lymph nodes: Cervical, supraclavicular, and axillary nodes  normal. Resp: clear to auscultation bilaterally Back: symmetric, no curvature. ROM normal. No CVA tenderness. Cardio: regular rate and rhythm, S1, S2 normal, no murmur, click, rub or gallop GI: soft, non-tender; bowel sounds normal; no masses,  no organomegaly Extremities: extremities normal, atraumatic, no cyanosis or edema and Homans sign is negative, no sign of DVT Neurologic: Grossly normal    Diagnostic Studies & Laboratory data:     Recent Radiology Findings:   Ct Chest Wo Contrast  10/11/2015  ADDENDUM REPORT: 10/11/2015 11:41 ADDENDUM: The nodule described in the original report is in the RIGHT lower lobe. Additionally, the possible nodule seen on recent chest x-ray is not visualized on this CT and was artifactual, likely due to confluence of shadows. Electronically Signed   By: Dorise Bullion III M.D   On: 10/11/2015 11:41  10/11/2015  CLINICAL DATA:  Evaluate pulmonary nodule seen on recent chest x-ray. EXAM: CT CHEST WITHOUT CONTRAST TECHNIQUE: Multidetector CT imaging of the chest was performed following the standard protocol without IV contrast. COMPARISON:  Chest x-ray August 22, 2015 FINDINGS: There is a 3.4 mm nodule in the left base on image 91. There is mild atelectasis dependently in the left lung. The left lung otherwise demonstrates no acute abnormalities. There is a nodular opacity deep in the posterior right lung as seen on axial image 111, sagittal image 50, and coronal image 109.  This abnormality measures 2.2 x 1.3 x 2.4 cm in transverse, AP, and craniocaudal dimensions. The nodular abnormality abuts the pleura but there is no definitive associated pleural thickening or associated volume loss. No other suspicious nodules on the right. No suspicious infiltrates. Postlumpectomy changes seen in the left breast. The central great vessels are normal. No adenopathy is seen in the chest. The heart size is normal. No effusions. Evaluation of the upper abdomen is limited without contrast but no acute abnormalities are seen. Mild degenerative changes in the thoracic spine. The bones are otherwise unremarkable. IMPRESSION: 1. There is a 2.2 x 1.3 x 2.4 cm nodule posteriorly in the left lung abutting the pleura with no underlying pleural thickening. While rounded atelectasis is possible, the abnormality does not meet all the criteria for rounded atelectasis. Neoplasm/malignancy is not excluded. See below for follow-up recommendations. 2. 3.4 mm nodule in the left lung. 3. No other acute abnormalities These results will be called to the ordering clinician or representative by the Radiologist Assistant, and communication documented in the PACS or zVision Dashboard. Consider one of the following in 3 months for both low-risk and high-risk individuals: (a) repeat chest CT, (b) follow-up PET-CT, or (c) tissue sampling. This recommendation follows the consensus statement: Guidelines for Management of Incidental Pulmonary Nodules Detected on CT Images:From the Fleischner Society 2017; published online before print (10.1148/radiol.6378588502). Electronically Signed: By: Dorise Bullion III M.D On: 10/05/2015 13:53   Nm Pet Image Initial (pi) Skull Base To Thigh  10/18/2015  CLINICAL DATA:  Initial treatment strategy for indeterminate basilar right lower lobe pulmonary nodule seen on recent chest CT. EXAM: NUCLEAR MEDICINE PET SKULL BASE TO THIGH TECHNIQUE: 8.5 mCi F-18 FDG was injected intravenously.  Full-ring PET imaging was performed from the skull base to thigh after the radiotracer. CT data was obtained and used for attenuation correction and anatomic localization. FASTING BLOOD GLUCOSE:  Value: 103 mg/dl COMPARISON:  10/05/2015 chest CT. FINDINGS: NECK No hypermetabolic lymph nodes in the neck. There is a hypermetabolic 1.9 cm right thyroid lobe nodule (series 4/image 41) with max SUV 4.1. CHEST  There is a subpleural 2.1 x 1.0 cm basilar right lower lobe pulmonary nodule (series 4/image 91) with mild hypermetabolism with max SUV 4.6, which measured 2.2 x 1.2 cm on 10/05/2015 chest CT using similar measurement technique, not definitely changed in size. No additional significant pulmonary nodules. No hypermetabolic axillary, mediastinal or hilar nodes. No pleural effusions. ABDOMEN/PELVIS No abnormal hypermetabolic activity within the liver, pancreas, adrenal glands, or spleen. No hypermetabolic lymph nodes in the abdomen or pelvis. Diffuse colonic diverticulosis, most prominent in the sigmoid colon. SKELETON No focal hypermetabolic activity to suggest skeletal metastasis. IMPRESSION: 1. Subpleural 2.1 x 1.0 cm basilar right lower lobe pulmonary nodule with mild hypermetabolism (max SUV 4.6), not definitely changed in size since 10/05/2015 chest CT. This nodule remains nonspecific. Differential considerations include a malignant pulmonary nodule, a solitary fibrous tumor of the pleura or less likely infection (given the relative stability compared to the chest CT from two weeks prior). Tissue sampling versus short term chest CT follow-up are management considerations. The location of this nodule at the lung base abutting a rib may preclude percutaneous biopsy. 2. Hypermetabolic 1.9 cm right thyroid lobe nodule. Recommend correlation with thyroid ultrasound and ultrasound-guided fine-needle aspiration if a thyroid nodule is identified at sonography. 3. Diffuse colonic diverticulosis. Electronically Signed    By: Ilona Sorrel M.D.   On: 10/18/2015 09:44     I have independently reviewed the above radiologic studies.  Recent Lab Findings: Lab Results  Component Value Date   WBC 15.3* 08/22/2015   HGB 13.1 08/22/2015   HCT 38.5 08/22/2015   PLT 210 08/22/2015   GLUCOSE 122* 08/22/2015   ALT 29 09/05/2009   AST 31 09/05/2009   NA 139 08/22/2015   K 4.3 08/22/2015   CL 105 08/22/2015   CREATININE 1.08* 08/22/2015   BUN 21* 08/22/2015   CO2 24 08/22/2015      Assessment / Plan:     Hypermetabolic pleural base to centimeter mass right lung, and lifelong nonsmoker- this lung nodule remains nonspecific patient does have a previous history of breast cancer in 2006 there is no other evidence of recurrence. I discussed the options with the patient and we will proceed with CT-guided needle biopsy right lung lesion and if malignant proceed with surgery, benign follow-up CT scan  Right 1.9 cm hypermetabolic thyroid nodule- ultrasound of the thyroid will be ordered and then coordinate with primary care further workup of thyroid nodule.   I  spent 30 minutes counseling the patient face to face and 50% or more the  time was spent in counseling and coordination of care. The total time spent in the appointment was 40 minutes.  Grace Isaac MD      [facility] [place] [address] Office [phone]   Beeper [phone]  10/18/2015 5:07 PM

## 2015-10-19 ENCOUNTER — Telehealth: Payer: Self-pay | Admitting: Pulmonary Disease

## 2015-10-19 ENCOUNTER — Other Ambulatory Visit: Payer: Self-pay | Admitting: *Deleted

## 2015-10-19 DIAGNOSIS — R911 Solitary pulmonary nodule: Secondary | ICD-10-CM

## 2015-10-19 DIAGNOSIS — E041 Nontoxic single thyroid nodule: Secondary | ICD-10-CM

## 2015-10-19 NOTE — Telephone Encounter (Signed)
Ok - will await biopsy results

## 2015-10-19 NOTE — Telephone Encounter (Signed)
Result Notes     Notes Recorded by Virl Cagey, CMA on 10/19/2015 at 11:31 AM Pt states that she spoke with Dr Everrett Coombe office yesterday and they have scheduled a CT Guided Biopsy and U/S in the next 1-2 weeks. Will send to Dr Elsworth Soho as Juluis Rainier. Pt wanting to know when she needs to follow up with RA again?   Pt requests call back on cell # [phone] ------  Notes Recorded by Rigoberto Noel, MD on 10/19/2015 at 10:41 AM Pl confirm that she got PET results from dr gerhardt yesterrday   Called and spoke with pt. She states that she spoke with Ashtyn earlier and she stated that she would send a message to RA to address when she needs a follow up. She states she is seeing Dr. Servando Snare and she canceled the appointment with RA because she felt that it was not needed at this time. I explained to her that I would send the message to RA to make him aware. She voiced understanding and had no further questions.   Will send to RA as FYI

## 2015-10-22 ENCOUNTER — Ambulatory Visit (HOSPITAL_COMMUNITY): Payer: Medicare Other

## 2015-10-23 ENCOUNTER — Ambulatory Visit
Admission: RE | Admit: 2015-10-23 | Discharge: 2015-10-23 | Disposition: A | Discharge status: Home / Self Care | Payer: Medicare Other | Source: Ambulatory Visit | Attending: Cardiothoracic Surgery | Admitting: Cardiothoracic Surgery

## 2015-10-23 DIAGNOSIS — E041 Nontoxic single thyroid nodule: Secondary | ICD-10-CM

## 2015-10-23 DIAGNOSIS — E042 Nontoxic multinodular goiter: Secondary | ICD-10-CM | POA: Diagnosis not present

## 2015-10-24 ENCOUNTER — Other Ambulatory Visit: Payer: Self-pay | Admitting: Radiology

## 2015-10-25 ENCOUNTER — Encounter (HOSPITAL_COMMUNITY): Payer: Self-pay

## 2015-10-25 ENCOUNTER — Ambulatory Visit (HOSPITAL_COMMUNITY)
Admission: RE | Admit: 2015-10-25 | Discharge: 2015-10-25 | Disposition: A | Discharge status: Home / Self Care | Payer: Medicare Other | Source: Ambulatory Visit | Attending: Cardiothoracic Surgery | Admitting: Cardiothoracic Surgery

## 2015-10-25 ENCOUNTER — Ambulatory Visit (HOSPITAL_COMMUNITY)
Admission: RE | Admit: 2015-10-25 | Discharge: 2015-10-25 | Disposition: A | Discharge status: Home / Self Care | Payer: Medicare Other | Source: Ambulatory Visit | Attending: Interventional Radiology | Admitting: Interventional Radiology

## 2015-10-25 DIAGNOSIS — Z79899 Other long term (current) drug therapy: Secondary | ICD-10-CM | POA: Insufficient documentation

## 2015-10-25 DIAGNOSIS — R911 Solitary pulmonary nodule: Secondary | ICD-10-CM

## 2015-10-25 DIAGNOSIS — J9811 Atelectasis: Secondary | ICD-10-CM | POA: Diagnosis not present

## 2015-10-25 DIAGNOSIS — J8410 Pulmonary fibrosis, unspecified: Secondary | ICD-10-CM | POA: Insufficient documentation

## 2015-10-25 DIAGNOSIS — Z9889 Other specified postprocedural states: Secondary | ICD-10-CM | POA: Diagnosis not present

## 2015-10-25 DIAGNOSIS — K219 Gastro-esophageal reflux disease without esophagitis: Secondary | ICD-10-CM | POA: Diagnosis not present

## 2015-10-25 LAB — CBC
HEMATOCRIT: 41.5 % (ref 36.0–46.0)
HEMOGLOBIN: 13.4 g/dL (ref 12.0–15.0)
MCH: 29.6 pg (ref 26.0–34.0)
MCHC: 32.3 g/dL (ref 30.0–36.0)
MCV: 91.6 fL (ref 78.0–100.0)
Platelets: 230 10*3/uL (ref 150–400)
RBC: 4.53 MIL/uL (ref 3.87–5.11)
RDW: 14.6 % (ref 11.5–15.5)
WBC: 5 10*3/uL (ref 4.0–10.5)

## 2015-10-25 LAB — PROTIME-INR
INR: 1.03 (ref 0.00–1.49)
Prothrombin Time: 13.7 seconds (ref 11.6–15.2)

## 2015-10-25 LAB — APTT: aPTT: 29 seconds (ref 24–37)

## 2015-10-25 MED ORDER — SODIUM CHLORIDE 0.9 % IV SOLN: Freq: Once | INTRAVENOUS | Status: DC

## 2015-10-25 MED ORDER — LIDOCAINE HCL 1 % IJ SOLN
INTRAMUSCULAR | Status: AC
  Filled 2015-10-25: qty 20

## 2015-10-25 MED ORDER — FENTANYL CITRATE (PF) 100 MCG/2ML IJ SOLN
INTRAMUSCULAR | Status: AC
  Filled 2015-10-25: qty 2

## 2015-10-25 MED ORDER — MIDAZOLAM HCL 2 MG/2ML IJ SOLN
INTRAMUSCULAR | Status: AC
  Filled 2015-10-25: qty 2

## 2015-10-25 MED ORDER — MIDAZOLAM HCL 2 MG/2ML IJ SOLN
INTRAMUSCULAR | Status: AC | PRN
  Administered 2015-10-25 (×2): 1 mg via INTRAVENOUS

## 2015-10-25 MED ORDER — FENTANYL CITRATE (PF) 100 MCG/2ML IJ SOLN
INTRAMUSCULAR | Status: AC | PRN
  Administered 2015-10-25 (×2): 50 ug via INTRAVENOUS

## 2015-10-25 NOTE — Sedation Documentation (Signed)
Patient is resting comfortably. 

## 2015-10-25 NOTE — Sedation Documentation (Signed)
Patient denies pain and is resting comfortably.  

## 2015-10-25 NOTE — Discharge Instructions (Signed)

## 2015-10-25 NOTE — H&P (Signed)
Chief Complaint: Patient was seen in consultation today for right lung mass biopsy at the request of Gerhardt,Edward B  Referring Physician(s): Grace Isaac  Supervising Physician: Marybelle Killings  Patient Status: Outpatient  History of Present Illness: Meghan [name] is a 70 y.o. female   Pt fell and fractured ankle 08/2015 Also hurt her Rt chest Continued pain in chest wall-- and pre op CXR for ankle fixation revealed Rt lung mass Work up ensued CT 10/05/2015: IMPRESSION: 1. There is a 2.2 x 1.3 x 2.4 cm nodule posteriorly in the left lung abutting the pleura with no underlying pleural thickening. While rounded atelectasis is possible, the abnormality does not meet all the criteria for rounded atelectasis. Neoplasm/malignancy is not excluded. See below for follow-up recommendations. 2. 3.4 mm nodule in the left lung. 3. No other acute abnormalities  PET 10/18/15: IMPRESSION: 1. Subpleural 2.1 x 1.0 cm basilar right lower lobe pulmonary nodule with mild hypermetabolism (max SUV 4.6), not definitely changed in size since 10/05/2015 chest CT. This nodule remains nonspecific. Differential considerations include a malignant pulmonary nodule, a solitary fibrous tumor of the pleura or less likely infection (given the relative stability compared to the chest CT from two weeks prior). Tissue sampling versus short term chest CT follow-up are management considerations. The location of this nodule at the lung base abutting a rib may preclude percutaneous biopsy. 2. Hypermetabolic 1.9 cm right thyroid lobe nodule. Recommend correlation with thyroid ultrasound and ultrasound-guided fine-needle aspiration if a thyroid nodule is identified at sonography. 3. Diffuse colonic diverticulosis.  Was referred to Dr Servando Snare for evaluation and now scheduled for right lung mass biopsy Hx Left Breast Ca 2006  Past Medical History  Diagnosis Date  . Esophageal stricture   . GERD  (gastroesophageal reflux disease)   . Diverticulosis of colon (without mention of hemorrhage)   . Colon polyps     Hyperplastic   . Restless legs syndrome (RLS)   . Restless leg syndrome 03/27/2014  . Toe pain, right 09/2014    for 2 wks  . 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 Numbness     feet bilat at nighttime  . History of urinary tract infection   . Urinary urgency   . Arthritis   . History of radiation therapy     Past Surgical History  Procedure Laterality Date  . Breast surgery      left 2006  . Cesarean section    . Orif ankle fracture Left 08/24/2015    Procedure: OPEN REDUCTION INTERNAL FIXATION (ORIF) LEFT ANKLE FRACTURE;  Surgeon: Mcarthur Rossetti, MD;  Location: [facility];  Service: Orthopedics;  Laterality: Left;  General with a popliteal block    Allergies: Nsaids  Medications: Prior to Admission medications   Medication Sig Start Date End Date Taking? Authorizing Provider  Calcium Citrate (CITRACAL PO) Take 1,200 mg by mouth daily.    Yes Historical Provider, MD  Cholecalciferol (VITAMIN D3) 2000 units TABS Take 2,000 Units by mouth daily.    Yes Historical Provider, MD  clidinium-chlordiazePOXIDE (LIBRAX) 5-2.5 MG per capsule Take 1 capsule by mouth as needed. Patient taking differently: Take 1 capsule by mouth every 6 (six) hours as needed (for divertulitis flares).  12/08/14 01/28/16 Yes Irene Shipper, MD  Cranberry-Vitamin C-Probiotic (AZO CRANBERRY) 250-30 MG TABS Take 1 tablet by mouth 2 (two) times daily.    Yes Historical Provider, MD  [place]  Take 2 tablets by mouth at bedtime.    Yes Historical Provider, MD  fluticasone (FLONASE) 50 MCG/ACT nasal spray Place 1 spray into both nostrils daily as needed for allergies or rhinitis.    Yes Historical Provider, MD  gabapentin (NEURONTIN) 100 MG capsule Take 126m po three times daily  and 3056mpohs Patient taking differently: Take 600 mg by mouth at  bedtime.  04/03/15  Yes NaDennie BibleNP  Misc Natural Products (OSTEO BI-FLEX ADV TRIPLE ST) TABS Take 1 tablet by mouth 2 (two) times daily.   Yes Historical Provider, MD  Multiple Vitamin (MULTIVITAMIN) tablet Take 1 tablet by mouth every evening.    Yes Historical Provider, MD  NEXIUM 40 MG capsule TAKE 1 CAPSULE DAILY 07/31/15  Yes JoIrene ShipperMD  Omega-3 Fatty Acids (FISH OIL) 1200 MG CAPS Take 1,200 mg by mouth daily.    Yes Historical Provider, MD  oxyCODONE-acetaminophen (PERCOCET/ROXICET) 5-325 MG tablet Take 1 tablet by mouth at bedtime as needed for moderate pain or severe pain.   Yes Historical Provider, MD  pramipexole (MIRAPEX) 0.25 MG tablet Take 1 tablet (0.25 mg total) by mouth at bedtime. 10/04/15  Yes ChKathrynn DuckingMD     Family History  Problem Relation Age of Onset  . COPD Father   . Heart disease Father   . Emphysema Father     smoker  . Breast cancer Sister [id number]  mastectomy; metastasis to liver and lung  . Heart disease Maternal Grandmother   . Congestive Heart Failure Maternal Grandmother   . Heart disease Paternal Grandmother   . Obesity Brother   . Heart attack Mother   . Neuropathy Neg Hx   . Breast cancer Maternal Aunt     dx. late 8052s. Alzheimer's disease Maternal Aunt   . Breast cancer Maternal Aunt     dx. 7048s. Squamous cell carcinoma Maternal Aunt     recently removed; dx. 95  . Breast cancer Cousin 62    genetic testing in approx 2015  . Ovarian cancer Cousin     dx. 504s. Esophageal cancer Cousin     not a smoker    Social History   Social History  . Marital Status: Married    Spouse Name: N/A  . Number of Children: 2  . Years of Education: AS   Occupational History  . RN     part-time  . Retired    Social History Main Topics  . Smoking status: Never Smoker   . Smokeless tobacco: Never Used  . Alcohol Use: 0.0 oz/week    0 Standard drinks or equivalent per week     Comment: very rarely - wine  . Drug Use: No    . Sexual Activity: Yes   Other Topics Concern  . None   Social History Narrative   Lives at home w/ her husband   Right-handed   Occasionally drinks tea      Review of Systems: A 12 point ROS discussed and pertinent positives are indicated in the HPI above.  All other systems are negative.  Review of Systems  Constitutional: Negative for fever, activity change, appetite change and fatigue.  Respiratory: Negative for cough and shortness of breath.   Cardiovascular: Positive for chest pain.  Musculoskeletal: Positive for gait problem.       Fx L ankle  Neurological: Negative for weakness.  Psychiatric/Behavioral: Negative for behavioral problems and confusion.  Vital Signs: BP 138/83 mmHg  Pulse 81  Temp(Src) 97.8 F (36.6 C) (Oral)  Resp 18  Ht 5' 4"  (1.626 m)  Wt 171 lb (77.565 kg)  BMI 29.34 kg/m2  SpO2 99%  Physical Exam  Constitutional: She is oriented to person, place, and time.  Cardiovascular: Normal rate, regular rhythm and normal heart sounds.   Pulmonary/Chest: Effort normal and breath sounds normal. She has no wheezes.  Abdominal: Soft. Bowel sounds are normal. There is no tenderness.  Musculoskeletal: Normal range of motion.  Left ankle pain/weakness  Neurological: She is alert and oriented to person, place, and time.  Skin: Skin is warm and dry.  Psychiatric: She has a normal mood and affect. Her behavior is normal. Judgment and thought content normal.  Nursing note and vitals reviewed.   Mallampati Score:  MD Evaluation Airway: WNL Heart: WNL Abdomen: WNL Chest/ Lungs: WNL ASA  Classification: 3 Mallampati/Airway Score: One  Imaging: Ct Chest Wo Contrast  10/11/2015  ADDENDUM REPORT: 10/11/2015 11:41 ADDENDUM: The nodule described in the original report is in the RIGHT lower lobe. Additionally, the possible nodule seen on recent chest x-ray is not visualized on this CT and was artifactual, likely due to confluence of shadows. Electronically  Signed   By: Dorise Bullion III M.D   On: 10/11/2015 11:41  10/11/2015  CLINICAL DATA:  Evaluate pulmonary nodule seen on recent chest x-ray. EXAM: CT CHEST WITHOUT CONTRAST TECHNIQUE: Multidetector CT imaging of the chest was performed following the standard protocol without IV contrast. COMPARISON:  Chest x-ray August 22, 2015 FINDINGS: There is a 3.4 mm nodule in the left base on image 91. There is mild atelectasis dependently in the left lung. The left lung otherwise demonstrates no acute abnormalities. There is a nodular opacity deep in the posterior right lung as seen on axial image 111, sagittal image 50, and coronal image 109. This abnormality measures 2.2 x 1.3 x 2.4 cm in transverse, AP, and craniocaudal dimensions. The nodular abnormality abuts the pleura but there is no definitive associated pleural thickening or associated volume loss. No other suspicious nodules on the right. No suspicious infiltrates. Postlumpectomy changes seen in the left breast. The central great vessels are normal. No adenopathy is seen in the chest. The heart size is normal. No effusions. Evaluation of the upper abdomen is limited without contrast but no acute abnormalities are seen. Mild degenerative changes in the thoracic spine. The bones are otherwise unremarkable. IMPRESSION: 1. There is a 2.2 x 1.3 x 2.4 cm nodule posteriorly in the left lung abutting the pleura with no underlying pleural thickening. While rounded atelectasis is possible, the abnormality does not meet all the criteria for rounded atelectasis. Neoplasm/malignancy is not excluded. See below for follow-up recommendations. 2. 3.4 mm nodule in the left lung. 3. No other acute abnormalities These results will be called to the ordering clinician or representative by the Radiologist Assistant, and communication documented in the PACS or zVision Dashboard. Consider one of the following in 3 months for both low-risk and high-risk individuals: (a) repeat chest CT, (b)  follow-up PET-CT, or (c) tissue sampling. This recommendation follows the consensus statement: Guidelines for Management of Incidental Pulmonary Nodules Detected on CT Images:From the Fleischner Society 2017; published online before print (10.1148/[id number]). Electronically Signed: By: Dorise Bullion III M.D On: 10/05/2015 13:53   Nm Pet Image Initial (pi) Skull Base To Thigh  10/18/2015  CLINICAL DATA:  Initial treatment strategy for indeterminate basilar right lower lobe pulmonary nodule seen  on recent chest CT. EXAM: NUCLEAR MEDICINE PET SKULL BASE TO THIGH TECHNIQUE: 8.5 mCi F-18 FDG was injected intravenously. Full-ring PET imaging was performed from the skull base to thigh after the radiotracer. CT data was obtained and used for attenuation correction and anatomic localization. FASTING BLOOD GLUCOSE:  Value: 103 mg/dl COMPARISON:  10/05/2015 chest CT. FINDINGS: NECK No hypermetabolic lymph nodes in the neck. There is a hypermetabolic 1.9 cm right thyroid lobe nodule (series 4/image 41) with max SUV 4.1. CHEST There is a subpleural 2.1 x 1.0 cm basilar right lower lobe pulmonary nodule (series 4/image 91) with mild hypermetabolism with max SUV 4.6, which measured 2.2 x 1.2 cm on 10/05/2015 chest CT using similar measurement technique, not definitely changed in size. No additional significant pulmonary nodules. No hypermetabolic axillary, mediastinal or hilar nodes. No pleural effusions. ABDOMEN/PELVIS No abnormal hypermetabolic activity within the liver, pancreas, adrenal glands, or spleen. No hypermetabolic lymph nodes in the abdomen or pelvis. Diffuse colonic diverticulosis, most prominent in the sigmoid colon. SKELETON No focal hypermetabolic activity to suggest skeletal metastasis. IMPRESSION: 1. Subpleural 2.1 x 1.0 cm basilar right lower lobe pulmonary nodule with mild hypermetabolism (max SUV 4.6), not definitely changed in size since 10/05/2015 chest CT. This nodule remains nonspecific.  Differential considerations include a malignant pulmonary nodule, a solitary fibrous tumor of the pleura or less likely infection (given the relative stability compared to the chest CT from two weeks prior). Tissue sampling versus short term chest CT follow-up are management considerations. The location of this nodule at the lung base abutting a rib may preclude percutaneous biopsy. 2. Hypermetabolic 1.9 cm right thyroid lobe nodule. Recommend correlation with thyroid ultrasound and ultrasound-guided fine-needle aspiration if a thyroid nodule is identified at sonography. 3. Diffuse colonic diverticulosis. Electronically Signed   By: Ilona Sorrel M.D.   On: 10/18/2015 09:44   US Thyroid  10/23/2015  CLINICAL DATA:  Thyroid nodule. EXAM: THYROID ULTRASOUND TECHNIQUE: Ultrasound examination of the thyroid gland and adjacent soft tissues was performed. COMPARISON:  PET scan of October 18, 2015. FINDINGS: Right thyroid lobe Measurements: 4.5 x 1.8 x 1.4 cm. Multiple nodules are noted, with the largest being 1.9 x 1.3 x 0.7 cm solid nodule in inferior pole. Left thyroid lobe Measurements: 4.5 x 1.8 x 1.3 cm. Multiple nodules are noted, with the largest being 1.4 x 1.3 x 1.2 cm solid nodule in lower pole. Isthmus Thickness: 3 mm.  No nodules visualized. Lymphadenopathy None visualized. IMPRESSION: Nodules are noted bilaterally, with the largest measuring 1.9 cm in the Findings meet consensus criteria for biopsy. Ultrasound-guided fine needle aspiration should be considered, as per the consensus statement: Management of Thyroid Nodules Detected at [place]: Society of Radiologists in [place]. Radiology 2005; [id number]. inferior pole of right thyroid lobe. Electronically Signed   By: Marijo Conception, M.D.   On: 10/23/2015 16:17    Labs:  CBC:  Recent Labs  08/22/15 1020 10/25/15 0959  WBC 15.3* 5.0  HGB 13.1 13.4  HCT 38.5 41.5  PLT 210 230    COAGS: No results for input(s):  INR, APTT in the last 8760 hours.  BMP:  Recent Labs  08/22/15 1020  NA 139  K 4.3  CL 105  CO2 24  GLUCOSE 122*  BUN 21*  CALCIUM 9.6  CREATININE 1.08*  GFRNONAA 51*  GFRAA 59*    LIVER FUNCTION TESTS: No results for input(s): BILITOT, AST, ALT, ALKPHOS, PROT, ALBUMIN in the last 8760 hours.  TUMOR  MARKERS: No results for input(s): AFPTM, CEA, CA199, CHROMGRNA in the last 8760 hours.  Assessment and Plan:  Hx Breast Ca 2006 New Rt lung mass; +PET Now scheduled for biopsy Risks and Benefits discussed with the patient including, but not limited to bleeding, hemoptysis, respiratory failure requiring intubation, infection, pneumothorax requiring chest tube placement, stroke from air embolism or even death. All of the patient's questions were answered, patient is agreeable to proceed. Consent signed and in chart.   Thank you for this interesting consult.  I greatly enjoyed meeting Meghan [name] and look forward to participating in their care.  A copy of this report was sent to the requesting provider on this date.  Electronically Signed: Monia Sabal A 10/25/2015, 10:15 AM   I spent a total of  30 Minutes   in face to face in clinical consultation, greater than 50% of which was counseling/coordinating care for Rt lung mass bx

## 2015-10-25 NOTE — Procedures (Signed)
RLL lung nodule Bx No comp/EBL 

## 2015-10-26 ENCOUNTER — Ambulatory Visit: Payer: Medicare Other | Admitting: Adult Health

## 2015-10-30 ENCOUNTER — Telehealth: Payer: Self-pay | Admitting: Cardiothoracic Surgery

## 2015-10-30 NOTE — Telephone Encounter (Signed)
Called and notified patient of biopsy result, no maligancy Will have follow up in 3 months with limited ct of chest to ensure nodulr resolved Grace Isaac MD      [address] [place] [address] Office [phone]   [place]

## 2015-11-01 ENCOUNTER — Other Ambulatory Visit: Payer: Self-pay | Admitting: Neurology

## 2015-11-01 ENCOUNTER — Encounter: Payer: Self-pay | Admitting: Neurology

## 2015-11-01 MED ORDER — PRAMIPEXOLE DIHYDROCHLORIDE 0.5 MG PO TABS: 0.5000 mg | ORAL_TABLET | Freq: Every day | ORAL | Status: DC

## 2015-11-05 ENCOUNTER — Other Ambulatory Visit: Payer: Self-pay | Admitting: Obstetrics & Gynecology

## 2015-11-05 DIAGNOSIS — Z1231 Encounter for screening mammogram for malignant neoplasm of breast: Secondary | ICD-10-CM

## 2015-11-05 DIAGNOSIS — Z853 Personal history of malignant neoplasm of breast: Secondary | ICD-10-CM

## 2015-11-07 DIAGNOSIS — S82852D Displaced trimalleolar fracture of left lower leg, subsequent encounter for closed fracture with routine healing: Secondary | ICD-10-CM | POA: Diagnosis not present

## 2015-11-21 ENCOUNTER — Ambulatory Visit
Admission: RE | Admit: 2015-11-21 | Discharge: 2015-11-21 | Disposition: A | Discharge status: Home / Self Care | Payer: Medicare Other | Source: Ambulatory Visit | Attending: Obstetrics & Gynecology | Admitting: Obstetrics & Gynecology

## 2015-11-21 DIAGNOSIS — Z853 Personal history of malignant neoplasm of breast: Secondary | ICD-10-CM

## 2015-11-21 DIAGNOSIS — Z1231 Encounter for screening mammogram for malignant neoplasm of breast: Secondary | ICD-10-CM | POA: Diagnosis not present

## 2015-11-26 ENCOUNTER — Institutional Professional Consult (permissible substitution): Payer: Medicare Other | Admitting: Pulmonary Disease

## 2015-11-27 DIAGNOSIS — Z6832 Body mass index (BMI) 32.0-32.9, adult: Secondary | ICD-10-CM | POA: Diagnosis not present

## 2015-11-27 DIAGNOSIS — Z124 Encounter for screening for malignant neoplasm of cervix: Secondary | ICD-10-CM | POA: Diagnosis not present

## 2015-11-27 DIAGNOSIS — Z01419 Encounter for gynecological examination (general) (routine) without abnormal findings: Secondary | ICD-10-CM | POA: Diagnosis not present

## 2015-11-29 ENCOUNTER — Ambulatory Visit: Payer: Medicare Other | Attending: Orthopaedic Surgery | Admitting: Physical Therapy

## 2015-11-29 ENCOUNTER — Encounter: Payer: Self-pay | Admitting: Physical Therapy

## 2015-11-29 DIAGNOSIS — M25572 Pain in left ankle and joints of left foot: Secondary | ICD-10-CM | POA: Insufficient documentation

## 2015-11-29 DIAGNOSIS — R2689 Other abnormalities of gait and mobility: Secondary | ICD-10-CM | POA: Insufficient documentation

## 2015-11-29 DIAGNOSIS — M25672 Stiffness of left ankle, not elsewhere classified: Secondary | ICD-10-CM

## 2015-11-29 DIAGNOSIS — M6281 Muscle weakness (generalized): Secondary | ICD-10-CM | POA: Diagnosis not present

## 2015-11-29 DIAGNOSIS — R600 Localized edema: Secondary | ICD-10-CM

## 2015-11-29 NOTE — Therapy (Signed)
[place], [place], [address] Phone: [phone]   Fax:  [phone]  Physical Therapy Evaluation  Patient Details  Name: Meghan [name] MRN: [id number] Date of Birth: 1945-09-07 Referring Provider: Jean Rosenthal MD  Encounter Date: 11/29/2015      PT End of Session - 11/29/15 1554    Visit Number 1   Number of Visits 13   Date for PT Re-Evaluation 01/24/16   Authorization Type Medicare: kx mod by 15th visit, progress note by 10 visit   PT Start Time 1410   PT Stop Time 1455   PT Time Calculation (min) 45 min   Activity Tolerance Patient tolerated treatment well   Behavior During Therapy [facility] for tasks assessed/performed      Past Medical History:  Diagnosis Date  . Arthritis   . 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 Colon polyps    Hyperplastic   . Diverticulosis of colon (without mention of hemorrhage)   . Esophageal stricture   . GERD (gastroesophageal reflux disease)   . History of radiation therapy   . History of urinary tract infection   . Neuromuscular disorder ([place])    peripheral nueropathy  . Numbness    feet bilat at nighttime  . Restless leg syndrome 03/27/2014  . Restless legs syndrome (RLS)   . Toe pain, right 09/2014   for 2 wks  . Urinary urgency     Past Surgical History:  Procedure Laterality Date  . BREAST SURGERY     left 2006  . CESAREAN SECTION    . ORIF ANKLE FRACTURE Left 08/24/2015   Procedure: OPEN REDUCTION INTERNAL FIXATION (ORIF) LEFT ANKLE FRACTURE;  Surgeon: Mcarthur Rossetti, MD;  Location: [facility];  Service: Orthopedics;  Laterality: Left;  General with a popliteal block    There were no vitals filed for this visit.       Subjective Assessment - 11/29/15 1418    Subjective pt is 70 y.o F with s/p L ankle ORIF form bimalleolar fx on 08/24/2015. since surgery she reports she is doing better, but still  has nerve pain at night. continues to have pain in the front of the ankle with intermittent swelling noticed more following prolong sanding and walking.    Pertinent History hx of breast cx   How long can you sit comfortably? unlimited   How long can you stand comfortably? 1 hour   How long can you walk comfortably? 1 hour   Diagnostic tests x-ray on 09/07/2015   Patient Stated Goals get back to exercises and walking, move the ankle around,    Currently in Pain? Yes   Pain Score 0-No pain  occasionally shooting pain 5-6/10   Pain Location Ankle   Pain Orientation Left   Pain Descriptors / Indicators Shooting   Pain Type Surgical pain   Pain Radiating Towards nerve pain inthe lateral aspect   Pain Onset More than a month ago   Pain Frequency Intermittent   Aggravating Factors  after prolonged walking, sitting, standing,    Pain Relieving Factors rubbing the ankle, tylenol PRN            [facility] PT Assessment - 11/29/15 1410      Assessment   Medical Diagnosis S/P ORIF Lt ankle fx   Referring Provider Jean Rosenthal MD   Onset Date/Surgical Date 08/24/15   Hand Dominance Right   Next MD Visit make  one PRN   Prior Therapy no     Precautions   Precautions None     Restrictions   Weight Bearing Restrictions No     Balance Screen   Has the patient fallen in the past 6 months Yes   How many times? 1   Has the patient had a decrease in activity level because of a fear of falling?  No   Is the patient reluctant to leave their home because of a fear of falling?  No     Home Environment   Living Environment Private residence   Living Arrangements Spouse/significant other   Available Help at Discharge Available PRN/intermittently   Type of [place] to enter   Entrance Stairs-Number of Steps 4   Entrance Stairs-Rails Can reach both   [place] One level   Additional Comments Walking shoe and AFO     Prior Function   Level of Independence  Independent;Independent with basic ADLs   Vocation Part time employment  Nurse    Vocation Requirements prolonged sitting, triage, answering phone   Leisure reading, camping,      Cognition   Overall Cognitive Status Within Functional Limits for tasks assessed     Observation/Other Assessments   Lower Extremity Functional Scale  34/80     Observation/Other Assessments-Edema    Edema Circumferential     Circumferential Edema   Circumferential - Left  51.3 CM  R ankle 47.1 cm     Posture/Postural Control   Posture/Postural Control Postural limitations   Postural Limitations Rounded Shoulders;Forward head     ROM / Strength   AROM / PROM / Strength AROM;Strength;PROM     AROM   AROM Assessment Site Ankle   Right/Left Ankle Right;Left   Right Ankle Dorsiflexion 10   Right Ankle Plantar Flexion 60   Right Ankle Inversion 20   Right Ankle Eversion 18   Left Ankle Dorsiflexion 0  90 degrees   Left Ankle Plantar Flexion 20   Left Ankle Inversion 8   Left Ankle Eversion 4     PROM   PROM Assessment Site Ankle   Right/Left Ankle Left   Left Ankle Dorsiflexion 10  tightness at ER   Left Ankle Plantar Flexion 28   Left Ankle Inversion 20   Left Ankle Eversion 10     Strength   Strength Assessment Site Ankle   Right/Left Ankle Right;Left   Right Ankle Dorsiflexion 4+/5   Right Ankle Plantar Flexion 5/5   Right Ankle Inversion 5/5   Right Ankle Eversion 5/5   Left Ankle Dorsiflexion 4/5   Left Ankle Plantar Flexion 4-/5  pain inthe top of the foot   Left Ankle Inversion 3/5   Left Ankle Eversion 3/5     Ambulation/Gait   Gait Pattern Step-through pattern;Decreased stride length;Decreased stance time - right;Decreased step length - left;Antalgic;Trendelenburg;Decreased trunk rotation;Narrow base of support     Static Standing Balance   Static Standing Balance -  Activities  Single Leg Stance - Right Leg;Single Leg Stance - Left Leg   Static Standing - Comment/# of  Minutes 30 sec on RLE SLS with minimal postural sway, 3 sec out of 2 attempts with moderate postural sway on LLE SLS                           PT Education - 11/29/15 1554    Education provided Yes   Education  Details evaluation findings, POC, goals, HEP treatment rationale and proper form   Person(s) Educated Patient   Methods Explanation;Verbal cues;Demonstration   Comprehension Verbalized understanding;Verbal cues required          PT Short Term Goals - 11/29/15 1606      PT SHORT TERM GOAL #1   Title pt will be I with inital HEp (12/30/2015)   Time 4   Period Weeks   Status New     PT SHORT TERM GOAL #2   Title pt will be able to demo proper techniques to reduce L ankle pain and edema via RICE and HEP (12/30/2015)   Time 4   Period Weeks   Status New     PT SHORT TERM GOAL #3   Title reduce L ankle swelling by >/= 2 cm to promote ankle AROM with </=3/10 pain (12/30/2015)   Time 4   Period Weeks   Status New           PT Long Term Goals - 11/29/15 1607      PT LONG TERM GOAL #1   Title pt will be I with advanced HEP given throughout treatment (01/24/2016)   Time 8   Period Weeks   Status New     PT LONG TERM GOAL #2   Title she will improve R ankle DF to >/= 5 degrees, PF to >/= 40 degrees to assist with functional and efficient gait pattern (01/24/2016)   Time 8   Period Weeks   Status New     PT LONG TERM GOAL #3   Title pt will improve R ankle strength to >/= 4+/5 in all planes to promote ankle stability with </= 2/10 pain (01/24/2016)   Time 8   Period Weeks   Status New     PT LONG TERM GOAL #4   Title she will be able to walk / stand for >/= 30 min with </= 2/10 pain to promote functional walking/ standing endurnace required for work and ADLs (01/24/2016)   Time 8   Period Weeks   Status New     PT LONG TERM GOAL #5   Title improve LEFS score to >/= 50 to demonstrate increase in function (01/24/2016)   Time 8   Period Weeks    Status New               Plan - 11/29/15 1555    Clinical Impression Statement Mrs. Ayars presents to OPPT as a moderate complexity evaluation S/P L ankle bi-malleolar fx ORIF on 08/24/2015. pt demos limited  R ankle AROM secondary to stiffness and soreness in the anterior aspect of the ankle. limited Ankle strength with soreness in during testing. increased swelling in the ankle at compared bil. antalgic gait pattern with limit stride/ step length on the R and stance time of the L. She would benefit from physical therapy to decrease L ankle pain, reduce swelling, improve mobiliy, increase strength and return pt to PLOF by addressing the impairments listed.   mod complexity based on HTN, arthritis, nueropathy. Eval findings limited ankle ROM, strength, abnormal gait, edema. signicant edema and limited gait pattern 3 months following surgery   Rehab Potential Good   PT Frequency 2x / week   PT Duration 6 weeks   PT Treatment/Interventions ADLs/Self Care Home Management;Cryotherapy;Electrical Stimulation;Iontophoresis 68m/ml Dexamethasone;Passive range of motion;Taping;Therapeutic exercise;Therapeutic activities;Moist Heat;Ultrasound;Patient/family education;Gait training;Stair training;Manual techniques;Vasopneumatic Device   PT Next Visit Plan assess/ review HEP, mobs to increase ankle mobility, stretching,  ankle strengthening, seated baps/ rocker board   PT Home Exercise Plan 4- way ankle strengthening, towel scrunches, standing/ seated calf stretches   Consulted and Agree with Plan of Care Patient      Patient will benefit from skilled therapeutic intervention in order to improve the following deficits and impairments:  Abnormal gait, Pain, Improper body mechanics, Postural dysfunction, Increased edema, Decreased strength, Hypomobility, Decreased range of motion, Decreased activity tolerance, Decreased balance, Decreased endurance, Increased fascial restricitons, Difficulty  walking  Visit Diagnosis: Pain in left ankle and joints of left foot - Plan: PT plan of care cert/re-cert  Localized edema - Plan: PT plan of care cert/re-cert  Stiffness of left ankle, not elsewhere classified - Plan: PT plan of care cert/re-cert  Other abnormalities of gait and mobility - Plan: PT plan of care cert/re-cert  Muscle weakness (generalized) - Plan: PT plan of care cert/re-cert      G-Codes - 35/43/01 1613    Functional Assessment Tool Used LEFS/ clinical judgement   Functional Limitation Mobility: Walking and moving around   Mobility: Walking and Moving Around Current Status (U8403) At least 40 percent but less than 60 percent impaired, limited or restricted   Mobility: Walking and Moving Around Goal Status [phone]) At least 1 percent but less than 20 percent impaired, limited or restricted       Problem List Patient Active Problem List   Diagnosis Date Noted  . Solitary pulmonary nodule 10/12/2015  . Closed trimalleolar fracture of left ankle 08/24/2015  . Ankle fracture, left 08/24/2015  . Trimalleolar fracture of left ankle 08/24/2015  . Family history of breast cancer in female 12/28/2014  . Family history of ovarian cancer 12/28/2014  . Genetic testing 12/07/2014  . Restless leg syndrome 03/27/2014  . Disturbance of skin sensation 10/20/2013  . ADENOCARCINOMA, BREAST, LEFT 04/10/2008  . ANXIETY 04/10/2008  . 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 GERD 04/10/2008  . DIVERTICULOSIS, COLON 04/10/2008  . ARTHRITIS 04/10/2008   Starr Lake PT, DPT, LAT, ATC  11/29/15  4:18 PM      [facility] [address] [place], [place], [address] Phone: [phone]   Fax:  [phone]  Name: Meghan [name] MRN: [id number] Date of Birth: 1945-10-15

## 2015-11-30 ENCOUNTER — Encounter: Payer: Self-pay | Admitting: Endocrinology

## 2015-11-30 ENCOUNTER — Ambulatory Visit (INDEPENDENT_AMBULATORY_CARE_PROVIDER_SITE_OTHER): Payer: Medicare Other | Admitting: Endocrinology

## 2015-11-30 VITALS — BP 130/80 | HR 95 | Wt 181.0 lb

## 2015-11-30 DIAGNOSIS — E042 Nontoxic multinodular goiter: Secondary | ICD-10-CM | POA: Diagnosis not present

## 2015-11-30 DIAGNOSIS — E041 Nontoxic single thyroid nodule: Secondary | ICD-10-CM | POA: Diagnosis not present

## 2015-11-30 NOTE — Progress Notes (Signed)
Patient ID: Meghan [name], female   DOB: June 24, 1945, 70 y.o.   MRN: [id number]                   Reason for Appointment: Multinodular goiter, new consultation  Referring physician: Dr. Hulan Fess   History of Present Illness:   She was apparently having a PET scan done to evaluate an incidental lung nodule and was seen to have a hypermetabolic right thyroid nodule Subsequent thyroid ultrasound showed multinodular goiter with the following findings:  Right thyroid lobe  Measurements: 4.5 x 1.8 x 1.4 cm. Multiple nodules are noted, with the largest being 1.9 x 1.3 x 0.7 cm solid nodule in inferior pole.  Left thyroid lobe  Measurements: 4.5 x 1.8 x 1.3 cm. Multiple nodules are noted, with the largest being 1.4 x 1.3 x 1.2 cm solid nodule in lower pole.  She has not had a diagnoses of goiter in the past and no known thyroid disease She has had difficulty with swallowing  Does not feel like she has any choking sensation in her neck or pressure in any position or when lying down.  No results found for: FREET4, TSH  TSH level was normal with her PCP in 5/17 She is now referred here for further evaluation of the thyroid nodule     Medication List       Accurate as of 11/30/15 11:59 PM. Always use your most recent med list.          AZO CRANBERRY 250-30 MG Tabs Take 1 tablet by mouth 2 (two) times daily.   CITRACAL PO Take 600 mg by mouth daily.   clidinium-chlordiazePOXIDE 5-2.5 MG capsule Commonly known as:  LIBRAX Take 1 capsule by mouth as needed.   FIBER SELECT GUMMIES PO Take 2 tablets by mouth at bedtime.   Fish Oil 1200 MG Caps Take 1,200 mg by mouth daily.   fluticasone 50 MCG/ACT nasal spray Commonly known as:  FLONASE Place 1 spray into both nostrils daily as needed for allergies or rhinitis.   gabapentin 100 MG capsule Commonly known as:  NEURONTIN Take 14m po three times daily  and 3032mpohs   multivitamin tablet Take 1 tablet by mouth  every evening.   NEXIUM 40 MG capsule Generic drug:  esomeprazole TAKE 1 CAPSULE DAILY   OSTEO BI-FLEX ADV TRIPLE ST Tabs Take 1 tablet by mouth 2 (two) times daily.   oxyCODONE-acetaminophen 5-325 MG tablet Commonly known as:  PERCOCET/ROXICET Take 1 tablet by mouth at bedtime as needed for moderate pain or severe pain.   pramipexole 0.5 MG tablet Commonly known as:  MIRAPEX Take 1 tablet (0.5 mg total) by mouth at bedtime.   Vitamin D3 2000 units Tabs Take 4,000 Units by mouth daily.       Allergies:  Allergies  Allergen Reactions  . Nsaids Other (See Comments)    CKD - per PCP    Past Medical History:  Diagnosis Date  . Arthritis   . 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 Colon polyps    Hyperplastic   . Diverticulosis of colon (without mention of hemorrhage)   . Esophageal stricture   . GERD (gastroesophageal reflux disease)   . History of radiation therapy   . History of urinary tract infection   . Neuromuscular disorder ([place]   peripheral nueropathy  . Numbness    feet bilat at nighttime  . Restless leg syndrome  03/27/2014  . Restless legs syndrome (RLS)   . Toe pain, right 09/2014   for 2 wks  . Urinary urgency     Past Surgical History:  Procedure Laterality Date  . BREAST SURGERY     left 2006  . CESAREAN SECTION    . ORIF ANKLE FRACTURE Left 08/24/2015   Procedure: OPEN REDUCTION INTERNAL FIXATION (ORIF) LEFT ANKLE FRACTURE;  Surgeon: Mcarthur Rossetti, MD;  Location: [facility];  Service: Orthopedics;  Laterality: Left;  General with a popliteal block    Family History  Problem Relation Age of Onset  . COPD Father   . Heart disease Father   . Emphysema Father     smoker  . Breast cancer Sister 47    mastectomy; metastasis to liver and lung  . Heart disease Maternal Grandmother   . Congestive Heart Failure Maternal Grandmother   . Heart disease Paternal Grandmother   . Obesity Brother   . Heart  attack Mother   . Breast cancer Maternal Aunt     dx. late 13s  . Alzheimer's disease Maternal Aunt   . Breast cancer Maternal Aunt     dx. 29s  . Squamous cell carcinoma Maternal Aunt     recently removed; dx. 95  . Breast cancer Cousin 62    genetic testing in approx 2015  . Ovarian cancer Cousin     dx. 21s  . Esophageal cancer Cousin     not a smoker  . Neuropathy Neg Hx   . Thyroid disease Neg Hx     Social History:  reports that she has never smoked. She has never used smokeless tobacco. She reports that she drinks alcohol. She reports that she does not use drugs.    Review of Systems  Constitutional: Negative for weight loss.  HENT: Negative for hoarseness and trouble swallowing.   Respiratory: Negative for shortness of breath.   Endocrine: Negative for fatigue.      Examination:   BP 130/80 (BP Location: Left Arm, Patient Position: Sitting)   Pulse 95   Wt 181 lb (82.1 kg)   SpO2 96%   BMI 31.07 kg/m    General Appearance: pleasant,          Eyes: No abnormal prominence or eyelid swelling.          Neck: The thyroid is Nonpalpable on the right side.  Left side shows about a 1.5 cm nodule, firm and smooth There is no stridor. There is no lymphadenopathy.     Cardiovascular: Normal  heart sounds, no murmur Respiratory:  Lungs clear Neurological: REFLEXES: at biceps are normal.  Skin: no rash        Assessment/Plan:  Multinodular goiter, nonpalpable but diagnosed radiologically Patient is asymptomatic She has multiple nodules with slightly larger right inferior lobe nodule of 1.9 cm compared to other nodules Although most likely she has a benign process because of the positive PET scan on the right sided nodule would like to consider needle aspiration biopsy to evaluate. She is also somewhat concerned about her malignancy with her prior history of breast cancer  Discussed how the needle aspiration  biopsy is to be done, potential results and evaluation  with Affirma testing if needed  Follow-up to be determined after her cytology report is available  [facility] 12/01/2015  Consultation note sent to Dr. Rex Kras

## 2015-12-01 ENCOUNTER — Encounter: Payer: Self-pay | Admitting: Endocrinology

## 2015-12-03 ENCOUNTER — Encounter: Payer: Self-pay | Admitting: Neurology

## 2015-12-03 ENCOUNTER — Other Ambulatory Visit: Payer: Self-pay | Admitting: Neurology

## 2015-12-05 ENCOUNTER — Institutional Professional Consult (permissible substitution): Payer: Medicare Other | Admitting: Pulmonary Disease

## 2015-12-10 ENCOUNTER — Other Ambulatory Visit: Payer: Self-pay | Admitting: *Deleted

## 2015-12-10 ENCOUNTER — Encounter: Payer: Self-pay | Admitting: Neurology

## 2015-12-10 ENCOUNTER — Ambulatory Visit: Payer: Medicare Other | Attending: Orthopaedic Surgery | Admitting: Physical Therapy

## 2015-12-10 DIAGNOSIS — R2689 Other abnormalities of gait and mobility: Secondary | ICD-10-CM | POA: Diagnosis not present

## 2015-12-10 DIAGNOSIS — R911 Solitary pulmonary nodule: Secondary | ICD-10-CM

## 2015-12-10 DIAGNOSIS — M25672 Stiffness of left ankle, not elsewhere classified: Secondary | ICD-10-CM | POA: Insufficient documentation

## 2015-12-10 DIAGNOSIS — M6281 Muscle weakness (generalized): Secondary | ICD-10-CM | POA: Diagnosis not present

## 2015-12-10 DIAGNOSIS — M25572 Pain in left ankle and joints of left foot: Secondary | ICD-10-CM | POA: Insufficient documentation

## 2015-12-10 DIAGNOSIS — R600 Localized edema: Secondary | ICD-10-CM | POA: Diagnosis not present

## 2015-12-10 NOTE — Therapy (Signed)
[place], [place], [address] Phone: [phone]   Fax:  [phone]  Physical Therapy Treatment  Patient Details  Name: Meghan [name] MRN: [id number] Date of Birth: 23-Jun-1945 Referring Provider: Jean Rosenthal MD  Encounter Date: 12/10/2015      PT End of Session - 12/10/15 1814    Visit Number 2   Number of Visits 13   Date for PT Re-Evaluation 01/24/16   PT Start Time 0800   PT Stop Time 0858   PT Time Calculation (min) 58 min   Activity Tolerance Patient tolerated treatment well   Behavior During Therapy [facility] for tasks assessed/performed      Past Medical History:  Diagnosis Date  . Arthritis   . 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 Colon polyps    Hyperplastic   . Diverticulosis of colon (without mention of hemorrhage)   . Esophageal stricture   . GERD (gastroesophageal reflux disease)   . History of radiation therapy   . History of urinary tract infection   . Neuromuscular disorder ([place])    peripheral nueropathy  . Numbness    feet bilat at nighttime  . Restless leg syndrome 03/27/2014  . Restless legs syndrome (RLS)   . Toe pain, right 09/2014   for 2 wks  . Urinary urgency     Past Surgical History:  Procedure Laterality Date  . BREAST SURGERY     left 2006  . CESAREAN SECTION    . ORIF ANKLE FRACTURE Left 08/24/2015   Procedure: OPEN REDUCTION INTERNAL FIXATION (ORIF) LEFT ANKLE FRACTURE;  Surgeon: Mcarthur Rossetti, MD;  [facility];  Service: Orthopedics;  Laterality: Left;  General with a popliteal block    There were no vitals filed for this visit.      Subjective Assessment - 12/10/15 0806    Subjective 1-2/10 now.  up to  7/10.  has been doing her home exercises.  45+ minutes.     Pain Score 2    Pain Location Ankle   Pain Orientation Left   Pain Descriptors / Indicators Sore  uncomfortable   Aggravating  Factors  longer walking, moving foot certain way.   Pain Relieving Factors elevation, brace   Multiple Pain Sites No            [facility] PT Assessment - 12/10/15 0001      Circumferential Edema   Circumferential - Left  52 cm  26 cm malloli                     [facility] Adult PT Treatment/Exercise - 12/10/15 0001      Modalities   Modalities --  cold pack, leg elevated 10 minutes     Cryotherapy   Number Minutes Cryotherapy 10 Minutes   Cryotherapy Location --  foot/ ankle   Type of Cryotherapy --  cold pack . leg elevated     Manual Therapy   Manual therapy comments retrograde,  scartissue work, gentle stretching.  Tissue stiff congested softened with manual.       Ankle Exercises: Stretches   Other Stretch pro stretch 2-3 minutes, cues initially     Ankle Exercises: Seated   Towel Crunch --  10 AROM   Towel Inversion/Eversion --  10 X each,  cues   [place] Pickup 12  pulling top of foot   Heel Slides 10 reps  cues to  keep flat foot   Other Seated Ankle Exercises 4 way 10 X each red band,  minor cues                PT Education - 12/10/15 1813    Education provided Yes   Education Details how to decrease edema   Person(s) Educated Patient   Methods Explanation;Demonstration   Comprehension Verbalized understanding;Returned demonstration          PT Short Term Goals - 12/10/15 1816      PT SHORT TERM GOAL #1   Title pt will be I with inital HEp (12/30/2015)   Baseline minor cues   Time 4   Period Weeks   Status On-going     PT SHORT TERM GOAL #2   Title pt will be able to demo proper techniques to reduce L ankle pain and edema via RICE and HEP (12/30/2015)   Time 4   Period Weeks   Status On-going     PT SHORT TERM GOAL #3   Title reduce L ankle swelling by >/= 2 cm to promote ankle AROM with </=3/10 pain (12/30/2015)   Baseline 52 cm figure 8.  increased   Time 4   Period Weeks   Status On-going           PT Long Term  Goals - 11/29/15 1607      PT LONG TERM GOAL #1   Title pt will be I with advanced HEP given throughout treatment (01/24/2016)   Time 8   Period Weeks   Status New     PT LONG TERM GOAL #2   Title she will improve R ankle DF to >/= 5 degrees, PF to >/= 40 degrees to assist with functional and efficient gait pattern (01/24/2016)   Time 8   Period Weeks   Status New     PT LONG TERM GOAL #3   Title pt will improve R ankle strength to >/= 4+/5 in all planes to promote ankle stability with </= 2/10 pain (01/24/2016)   Time 8   Period Weeks   Status New     PT LONG TERM GOAL #4   Title she will be able to walk / stand for >/= 30 min with </= 2/10 pain to promote functional walking/ standing endurnace required for work and ADLs (01/24/2016)   Time 8   Period Weeks   Status New     PT LONG TERM GOAL #5   Title improve LEFS score to >/= 50 to demonstrate increase in function (01/24/2016)   Time 8   Period Weeks   Status New               Plan - 12/10/15 1814    Clinical Impression Statement Exercises continued.  ROM improving.  No new goals met.   PT Next Visit Plan assess/ review HEP, mobs to increase ankle mobility, stretching, ankle strengthening, seated baps/ rocker board   PT Home Exercise Plan continue, may want to decrease edema after work, prior to exercises.   Consulted and Agree with Plan of Care Patient      Patient will benefit from skilled therapeutic intervention in order to improve the following deficits and impairments:     Visit Diagnosis: Pain in left ankle and joints of left foot  Localized edema  Stiffness of left ankle, not elsewhere classified  Other abnormalities of gait and mobility  Muscle weakness (generalized)     Problem List Patient Active Problem List  Diagnosis Date Noted  . Solitary pulmonary nodule 10/12/2015  . Closed trimalleolar fracture of left ankle 08/24/2015  . Ankle fracture, left 08/24/2015  . Trimalleolar fracture  of left ankle 08/24/2015  . Family history of breast cancer in female 12/28/2014  . Family history of ovarian cancer 12/28/2014  . Genetic testing 12/07/2014  . Restless leg syndrome 03/27/2014  . Disturbance of skin sensation 10/20/2013  . ADENOCARCINOMA, BREAST, LEFT 04/10/2008  . ANXIETY 04/10/2008  . 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 GERD 04/10/2008  . DIVERTICULOSIS, COLON 04/10/2008  . ARTHRITIS 04/10/2008    Antionette Luster 12/10/2015, 6:19 PM  [facility] [address] [place], [place], [address] Phone: [phone]   Fax:  [phone]  Name: Meghan [name] MRN: [id number] Date of Birth: 07-Nov-1945   Melvenia Needles, PTA 12/10/15 6:19 PM Phone: [phone] Fax: [phone]

## 2015-12-11 ENCOUNTER — Telehealth: Payer: Self-pay | Admitting: Neurology

## 2015-12-11 MED ORDER — GABAPENTIN 300 MG PO CAPS: 600.0000 mg | ORAL_CAPSULE | Freq: Every day | ORAL | 0 refills | Status: DC

## 2015-12-11 MED ORDER — GABAPENTIN 300 MG PO CAPS: 600.0000 mg | ORAL_CAPSULE | Freq: Every day | ORAL | 2 refills | Status: DC

## 2015-12-11 NOTE — Telephone Encounter (Signed)
Patient requesting refill of gabapentin (NEURONTIN) 100 MG capsule [facility]   Pt will be out by Friday.please send 30 day supply to [place], [place] - [address] AT [address]

## 2015-12-11 NOTE — Telephone Encounter (Signed)
Refills retailed as requested. 

## 2015-12-11 NOTE — Addendum Note (Signed)
Addended by: Monte Fantasia on: 12/11/2015 08:41 AM   Modules accepted: Orders

## 2015-12-12 ENCOUNTER — Ambulatory Visit: Payer: Medicare Other | Admitting: Physical Therapy

## 2015-12-12 ENCOUNTER — Other Ambulatory Visit (HOSPITAL_COMMUNITY)
Admission: RE | Admit: 2015-12-12 | Discharge: 2015-12-12 | Disposition: A | Discharge status: Home / Self Care | Payer: Medicare Other | Source: Ambulatory Visit | Attending: Physician Assistant | Admitting: Physician Assistant

## 2015-12-12 ENCOUNTER — Ambulatory Visit
Admission: RE | Admit: 2015-12-12 | Discharge: 2015-12-12 | Disposition: A | Discharge status: Home / Self Care | Payer: Medicare Other | Source: Ambulatory Visit | Attending: Endocrinology | Admitting: Endocrinology

## 2015-12-12 DIAGNOSIS — E041 Nontoxic single thyroid nodule: Secondary | ICD-10-CM | POA: Diagnosis not present

## 2015-12-12 DIAGNOSIS — M25672 Stiffness of left ankle, not elsewhere classified: Secondary | ICD-10-CM

## 2015-12-12 DIAGNOSIS — M6281 Muscle weakness (generalized): Secondary | ICD-10-CM | POA: Diagnosis not present

## 2015-12-12 DIAGNOSIS — M25572 Pain in left ankle and joints of left foot: Secondary | ICD-10-CM

## 2015-12-12 DIAGNOSIS — R2689 Other abnormalities of gait and mobility: Secondary | ICD-10-CM

## 2015-12-12 DIAGNOSIS — E042 Nontoxic multinodular goiter: Secondary | ICD-10-CM

## 2015-12-12 DIAGNOSIS — R600 Localized edema: Secondary | ICD-10-CM | POA: Diagnosis not present

## 2015-12-12 NOTE — Procedures (Signed)
Using direct ultrasound guidance, 5 passes were made using needles into the nodule within the right lobe of the thyroid.   Ultrasound was used to confirm needle placements on all occasions.   Specimens were sent to Pathology for analysis.   WENDY S BLAIR PA-C 12/12/2015 3:21 PM

## 2015-12-13 NOTE — Therapy (Signed)
[name], [place], [address] Phone: [phone]   Fax:  [phone]  Physical Therapy Treatment  Patient Details  Name: Meghan [name] MRN: [id number] Date of Birth: [birth year] Referring Provider: Jean Rosenthal MD  Encounter Date: 12/12/2015      PT End of Session - 12/13/15 0748    Visit Number 3   Number of Visits 13   Date for PT Re-Evaluation 01/24/16   Authorization Type Medicare: kx mod by 15th visit, progress note by 10 visit   PT Start Time 1630   PT Stop Time 1725   PT Time Calculation (min) 55 min   Activity Tolerance Patient tolerated treatment well   Behavior During Therapy [facility] for tasks assessed/performed      Past Medical History:  Diagnosis Date  . Arthritis   . 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 Colon polyps    Hyperplastic   . Diverticulosis of colon (without mention of hemorrhage)   . Esophageal stricture   . GERD (gastroesophageal reflux disease)   . History of radiation therapy   . History of urinary tract infection   . Neuromuscular disorder ([place])    peripheral nueropathy  . Numbness    feet bilat at nighttime  . Restless leg syndrome 03/27/2014  . Restless legs syndrome (RLS)   . Toe pain, right 09/2014   for 2 wks  . Urinary urgency     Past Surgical History:  Procedure Laterality Date  . BREAST SURGERY     left 2006  . CESAREAN SECTION    . ORIF ANKLE FRACTURE Left 08/24/2015   Procedure: OPEN REDUCTION INTERNAL FIXATION (ORIF) LEFT ANKLE FRACTURE;  Surgeon: Mcarthur Rossetti, MD;  Location: [facility];  Service: Orthopedics;  Laterality: Left;  General with a popliteal block    There were no vitals filed for this visit.      Subjective Assessment - 12/13/15 0742    Subjective Patient is sore today. She reports she has been doing errands and up on her feet for long periods of time. She is still wearing her  surgical shoe.    Patient is accompained by: Family member   Pertinent History hx of breast cx   How long can you sit comfortably? unlimited   How long can you stand comfortably? 1 hour   How long can you walk comfortably? 1 hour   Diagnostic tests x-ray on 09/07/2015   Patient Stated Goals get back to exercises and walking, move the ankle around,    Currently in Pain? Yes   Pain Score 2    Pain Location Ankle   Pain Orientation Left   Pain Descriptors / Indicators Sore   Pain Type Surgical pain   Pain Radiating Towards nerve pain into the lateral aspect    Pain Onset More than a month ago   Pain Frequency Intermittent   Aggravating Factors  longer walking, moving foot certain way    Pain Relieving Factors elevation, brace    Multiple Pain Sites No                         [facility] Adult PT Treatment/Exercise - 12/13/15 0001      Modalities   Modalities --  cold pack, leg elevated 10 minutes     Cryotherapy   Cryotherapy Location --  foot/ ankle     Manual Therapy  Manual therapy comments retrograde,  scartissue work, gentle stretching.  Tissue stiff congested softened with manual.  Anterior drawer mobilizations to reduce talor pain       Ankle Exercises: Seated   Towel Crunch --  10 AROM   Towel Inversion/Eversion --  10 X each,  cues   [place] Pickup 12  pulling top of foot   Heel Slides 10 reps  cues to keep flat foot   Other Seated Ankle Exercises 4 way 10 X each red band,  minor cues                PT Education - 12/13/15 0747    Education provided Yes   Education Details symptom mangement    Person(s) Educated Patient   Methods Explanation;Demonstration   Comprehension Verbalized understanding;Returned demonstration          PT Short Term Goals - 12/10/15 1816      PT SHORT TERM GOAL #1   Title pt will be I with inital HEp (12/30/2015)   Baseline minor cues   Time 4   Period Weeks   Status On-going     PT SHORT TERM GOAL #2    Title pt will be able to demo proper techniques to reduce L ankle pain and edema via RICE and HEP (12/30/2015)   Time 4   Period Weeks   Status On-going     PT SHORT TERM GOAL #3   Title reduce L ankle swelling by >/= 2 cm to promote ankle AROM with </=3/10 pain (12/30/2015)   Baseline 52 cm figure 8.  increased   Time 4   Period Weeks   Status On-going           PT Long Term Goals - 11/29/15 1607      PT LONG TERM GOAL #1   Title pt will be I with advanced HEP given throughout treatment (01/24/2016)   Time 8   Period Weeks   Status New     PT LONG TERM GOAL #2   Title she will improve R ankle DF to >/= 5 degrees, PF to >/= 40 degrees to assist with functional and efficient gait pattern (01/24/2016)   Time 8   Period Weeks   Status New     PT LONG TERM GOAL #3   Title pt will improve R ankle strength to >/= 4+/5 in all planes to promote ankle stability with </= 2/10 pain (01/24/2016)   Time 8   Period Weeks   Status New     PT LONG TERM GOAL #4   Title she will be able to walk / stand for >/= 30 min with </= 2/10 pain to promote functional walking/ standing endurnace required for work and ADLs (01/24/2016)   Time 8   Period Weeks   Status New     PT LONG TERM GOAL #5   Title improve LEFS score to >/= 50 to demonstrate increase in function (01/24/2016)   Time 8   Period Weeks   Status New               Plan - 12/13/15 0748    Clinical Impression Statement Patient tolerated treatment wll. she had no pain after treatment. Her range improved with passive range of motion. Exercises were kept consistent.    Rehab Potential Good   PT Frequency 2x / week   PT Duration 6 weeks   PT Treatment/Interventions ADLs/Self Care Home Management;Cryotherapy;Electrical Stimulation;Iontophoresis 51m/ml Dexamethasone;Passive range of motion;Taping;Therapeutic exercise;Therapeutic  activities;Moist Heat;Ultrasound;Patient/family education;Gait training;Stair training;Manual  techniques;Vasopneumatic Device   PT Next Visit Plan assess/ review HEP, mobs to increase ankle mobility, stretching, ankle strengthening, seated baps/ rocker board   PT Home Exercise Plan continue, may want to decrease edema after work, prior to exercises.   Consulted and Agree with Plan of Care Patient      Patient will benefit from skilled therapeutic intervention in order to improve the following deficits and impairments:  Abnormal gait, Pain, Improper body mechanics, Postural dysfunction, Increased edema, Decreased strength, Hypomobility, Decreased range of motion, Decreased activity tolerance, Decreased balance, Decreased endurance, Increased fascial restricitons, Difficulty walking  Visit Diagnosis: Pain in left ankle and joints of left foot  Localized edema  Stiffness of left ankle, not elsewhere classified  Other abnormalities of gait and mobility  Muscle weakness (generalized)     Problem List Patient Active Problem List   Diagnosis Date Noted  . Solitary pulmonary nodule 10/12/2015  . Closed trimalleolar fracture of left ankle 08/24/2015  . Ankle fracture, left 08/24/2015  . Trimalleolar fracture of left ankle 08/24/2015  . Family history of breast cancer in female 12/28/2014  . Family history of ovarian cancer 12/28/2014  . Genetic testing 12/07/2014  . Restless leg syndrome 03/27/2014  . Disturbance of skin sensation 10/20/2013  . ADENOCARCINOMA, BREAST, LEFT 04/10/2008  . ANXIETY 04/10/2008  . 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 GERD 04/10/2008  . DIVERTICULOSIS, COLON 04/10/2008  . ARTHRITIS 04/10/2008    Carney Living PT DPT  12/13/2015, 7:54 AM  [facility] [address] [place], [place], [address] Phone: [phone]   Fax:  [phone]  Name: CHRISANDRA WIEMERS MRN: [id number] Date of Birth: 12/22/1945

## 2015-12-17 ENCOUNTER — Ambulatory Visit: Payer: Medicare Other | Admitting: Physical Therapy

## 2015-12-17 DIAGNOSIS — M25672 Stiffness of left ankle, not elsewhere classified: Secondary | ICD-10-CM | POA: Diagnosis not present

## 2015-12-17 DIAGNOSIS — R600 Localized edema: Secondary | ICD-10-CM

## 2015-12-17 DIAGNOSIS — R2689 Other abnormalities of gait and mobility: Secondary | ICD-10-CM | POA: Diagnosis not present

## 2015-12-17 DIAGNOSIS — M6281 Muscle weakness (generalized): Secondary | ICD-10-CM

## 2015-12-17 DIAGNOSIS — M25572 Pain in left ankle and joints of left foot: Secondary | ICD-10-CM | POA: Diagnosis not present

## 2015-12-17 NOTE — Therapy (Signed)
[name], [place], [address] Phone: [phone]   Fax:  [phone]  Physical Therapy Treatment  Patient Details  Name: Meghan [name] MRN: [id number] Date of Birth: 1946-04-23 Referring Provider: Jean Rosenthal MD  Encounter Date: 12/17/2015      PT End of Session - 12/17/15 0934    Visit Number 4   Number of Visits 13   Date for PT Re-Evaluation 01/24/16   Authorization Type Medicare: kx mod by 15th visit, progress note by 10 visit   PT Start Time 0846   PT Stop Time 0929   PT Time Calculation (min) 43 min   Activity Tolerance Patient tolerated treatment well   Behavior During Therapy [facility] for tasks assessed/performed      Past Medical History:  Diagnosis Date  . Arthritis   . 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 Colon polyps    Hyperplastic   . Diverticulosis of colon (without mention of hemorrhage)   . Esophageal stricture   . GERD (gastroesophageal reflux disease)   . History of radiation therapy   . History of urinary tract infection   . Neuromuscular disorder ([place])    peripheral nueropathy  . Numbness    feet bilat at nighttime  . Restless leg syndrome 03/27/2014  . Restless legs syndrome (RLS)   . Toe pain, right 09/2014   for 2 wks  . Urinary urgency     Past Surgical History:  Procedure Laterality Date  . BREAST SURGERY     left 2006  . CESAREAN SECTION    . ORIF ANKLE FRACTURE Left 08/24/2015   Procedure: OPEN REDUCTION INTERNAL FIXATION (ORIF) LEFT ANKLE FRACTURE;  Surgeon: Mcarthur Rossetti, MD;  Location: [facility];  Service: Orthopedics;  Laterality: Left;  General with a popliteal block    There were no vitals filed for this visit.      Subjective Assessment - 12/17/15 0850    Subjective "Today is the first day I am wearing my shoe and it is really tight"    Currently in Pain? Yes   Pain Score 1    Pain Location  Ankle   Pain Orientation Left   Pain Descriptors / Indicators Tightness   Pain Onset More than a month ago   Pain Frequency Intermittent            [facility] PT Assessment - 12/17/15 0001      AROM   Left Ankle Dorsiflexion 2   Left Ankle Plantar Flexion 22   Left Ankle Inversion 8   Left Ankle Eversion 6     PROM   Left Ankle Dorsiflexion 10   Left Ankle Plantar Flexion 28   Left Ankle Inversion 22   Left Ankle Eversion 12     Strength   Left Ankle Dorsiflexion 4+/5   Left Ankle Plantar Flexion 4-/5  in available ROM, with sorness during testing   Left Ankle Inversion 3/5   Left Ankle Eversion 3/5                     [facility] Adult PT Treatment/Exercise - 12/17/15 0937      Manual Therapy   Manual Therapy Joint mobilization   Manual therapy comments retrograde,  scartissue work, gentle stretching.  Tissue stiff congested softened with manual.  Anterior drawer mobilizations to reduce talor pain     Joint Mobilization grade 2-3 ankle mobs in all  planes      Ankle Exercises: Standing   Other Standing Ankle Exercises mob with movement 2 x 20 with rocking foward / backward to improve DF/ PF     Ankle Exercises: Seated   Other Seated Ankle Exercises rocker board 2 x 20 DF/Pf, 2 x 20 inv/eversion                PT Education - 12/17/15 0933    Education provided Yes   Education Details self mobilization with movement added to HEP with proper form and rationale    Person(s) Educated Patient   Methods Explanation;Demonstration;Verbal cues;Handout   Comprehension Verbalized understanding;Returned demonstration          PT Short Term Goals - 12/10/15 1816      PT SHORT TERM GOAL #1   Title pt will be I with inital HEp (12/30/2015)   Baseline minor cues   Time 4   Period Weeks   Status On-going     PT SHORT TERM GOAL #2   Title pt will be able to demo proper techniques to reduce L ankle pain and edema via RICE and HEP (12/30/2015)   Time 4    Period Weeks   Status On-going     PT SHORT TERM GOAL #3   Title reduce L ankle swelling by >/= 2 cm to promote ankle AROM with </=3/10 pain (12/30/2015)   Baseline 52 cm figure 8.  increased   Time 4   Period Weeks   Status On-going           PT Long Term Goals - 11/29/15 1607      PT LONG TERM GOAL #1   Title pt will be I with advanced HEP given throughout treatment (01/24/2016)   Time 8   Period Weeks   Status New     PT LONG TERM GOAL #2   Title she will improve R ankle DF to >/= 5 degrees, PF to >/= 40 degrees to assist with functional and efficient gait pattern (01/24/2016)   Time 8   Period Weeks   Status New     PT LONG TERM GOAL #3   Title pt will improve R ankle strength to >/= 4+/5 in all planes to promote ankle stability with </= 2/10 pain (01/24/2016)   Time 8   Period Weeks   Status New     PT LONG TERM GOAL #4   Title she will be able to walk / stand for >/= 30 min with </= 2/10 pain to promote functional walking/ standing endurnace required for work and ADLs (01/24/2016)   Time 8   Period Weeks   Status New     PT LONG TERM GOAL #5   Title improve LEFS score to >/= 50 to demonstrate increase in function (01/24/2016)   Time [place] - 12/17/15 0934    Clinical Impression Statement [name] is demonstrating improvement with physical therapy improving ankle mobility and strength with no report of pain only tightness today. Focused on edema reduction and ankle mobility via mobs manual and actively with movement. provided mobs with movment as HEP. pt declined modalities post session.   PT Next Visit Plan mobs to increase ankle mobility, stretching, ankle strengthening, seated baps/ rocker board, gait / stair training, try nu-step   PT Home Exercise Plan ankle mobilization with movement  Consulted and Agree with Plan of Care Patient      Patient will benefit from skilled therapeutic intervention in order  to improve the following deficits and impairments:  Abnormal gait, Pain, Improper body mechanics, Postural dysfunction, Increased edema, Decreased strength, Hypomobility, Decreased range of motion, Decreased activity tolerance, Decreased balance, Decreased endurance, Increased fascial restricitons, Difficulty walking  Visit Diagnosis: Pain in left ankle and joints of left foot  Localized edema  Stiffness of left ankle, not elsewhere classified  Other abnormalities of gait and mobility  Muscle weakness (generalized)     Problem List Patient Active Problem List   Diagnosis Date Noted  . Solitary pulmonary nodule 10/12/2015  . Closed trimalleolar fracture of left ankle 08/24/2015  . Ankle fracture, left 08/24/2015  . Trimalleolar fracture of left ankle 08/24/2015  . Family history of breast cancer in female 12/28/2014  . Family history of ovarian cancer 12/28/2014  . Genetic testing 12/07/2014  . Restless leg syndrome 03/27/2014  . Disturbance of skin sensation 10/20/2013  . ADENOCARCINOMA, BREAST, LEFT 04/10/2008  . ANXIETY 04/10/2008  . 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 GERD 04/10/2008  . DIVERTICULOSIS, COLON 04/10/2008  . ARTHRITIS 04/10/2008   Starr Lake PT, DPT, LAT, ATC  12/17/15  9:40 AM      [facility] [address] [place], [place], [address] Phone: [phone]   Fax:  [phone]  Name: Meghan [name] MRN: [id number] Date of Birth: 29-May-1945

## 2015-12-20 ENCOUNTER — Ambulatory Visit: Payer: Medicare Other | Admitting: Physical Therapy

## 2015-12-20 DIAGNOSIS — M25672 Stiffness of left ankle, not elsewhere classified: Secondary | ICD-10-CM | POA: Diagnosis not present

## 2015-12-20 DIAGNOSIS — M25572 Pain in left ankle and joints of left foot: Secondary | ICD-10-CM | POA: Diagnosis not present

## 2015-12-20 DIAGNOSIS — M6281 Muscle weakness (generalized): Secondary | ICD-10-CM

## 2015-12-20 DIAGNOSIS — R2689 Other abnormalities of gait and mobility: Secondary | ICD-10-CM

## 2015-12-20 DIAGNOSIS — R600 Localized edema: Secondary | ICD-10-CM

## 2015-12-20 NOTE — Therapy (Addendum)
[place], [place], [address] Phone: [phone]   Fax:  [phone]  Physical Therapy Treatment  Patient Details  Name: TALISHIA BETZLER MRN: [id number] Date of Birth: March 31, 1946 Referring Provider: Jean Rosenthal MD  Encounter Date: 12/20/2015      PT End of Session - 12/20/15 1840    Visit Number 5   Number of Visits 13   Date for PT Re-Evaluation 01/24/16   PT Start Time 0850   PT Stop Time 0933   PT Time Calculation (min) 43 min   Activity Tolerance Patient tolerated treatment well   Behavior During Therapy [facility] for tasks assessed/performed      Past Medical History:  Diagnosis Date  . Arthritis   . 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 Colon polyps    Hyperplastic   . Diverticulosis of colon (without mention of hemorrhage)   . Esophageal stricture   . GERD (gastroesophageal reflux disease)   . History of radiation therapy   . History of urinary tract infection   . Neuromuscular disorder ([place])    peripheral nueropathy  . Numbness    feet bilat at nighttime  . Restless leg syndrome 03/27/2014  . Restless legs syndrome (RLS)   . Toe pain, right 09/2014   for 2 wks  . Urinary urgency     Past Surgical History:  Procedure Laterality Date  . BREAST SURGERY     left 2006  . CESAREAN SECTION    . ORIF ANKLE FRACTURE Left 08/24/2015   Procedure: OPEN REDUCTION INTERNAL FIXATION (ORIF) LEFT ANKLE FRACTURE;  Surgeon: Mcarthur Rossetti, MD;  Location: [facility];  Service: Orthopedics;  Laterality: Left;  General with a popliteal block    There were no vitals filed for this visit.      Subjective Assessment - 12/20/15 0915    Subjective I am going Out of town for 2 weeks.  Foot is sore and tight after working.     Currently in Pain? Yes   Pain Score 1   1/now   2/10 at end of day after work.    Pain Location Ankle   Pain Orientation Left    Pain Descriptors / Indicators Tightness   Pain Frequency Intermittent   Aggravating Factors  walking,  moving foot a certain way   Pain Relieving Factors brace elevation                         [facility] Adult PT Treatment/Exercise - 12/20/15 0001      Manual Therapy   Manual Therapy Joint mobilization   Manual therapy comments tissue/tendons foot,  ankle stiff.  Slightly less stiff post manual   Joint Mobilization mobilization with movement ,  for DF     Ankle Exercises: Seated   BAPS Sitting;Level 2;10 reps;Limitations  extra time     Ankle Exercises: Standing   Other Standing Ankle Exercises band exercises issued last visit revised  so patient able to do ,  one will require her husband's help.  Extra time required for this.                PT Education - 12/20/15 1839    Education provided Yes   Education Details continued mobilization with movement modifications.   Person(s) Educated Patient   Methods Explanation;Demonstration;Handout   Comprehension Verbalized understanding  PT Short Term Goals - 12/20/15 1842      PT SHORT TERM GOAL #1   Title pt will be I with inital HEp (12/30/2015)   Baseline minor cues   Time 4   Period Weeks   Status On-going     PT SHORT TERM GOAL #2   Title pt will be able to demo proper techniques to reduce L ankle pain and edema via RICE and HEP (12/30/2015)   Time 4   Period Weeks   Status Unable to assess           PT Long Term Goals - 11/29/15 1607      PT LONG TERM GOAL #1   Title pt will be I with advanced HEP given throughout treatment (01/24/2016)   Time 8   Period Weeks   Status New     PT LONG TERM GOAL #2   Title she will improve R ankle DF to >/= 5 degrees, PF to >/= 40 degrees to assist with functional and efficient gait pattern (01/24/2016)   Time 8   Period Weeks   Status New     PT LONG TERM GOAL #3   Title pt will improve R ankle strength to >/= 4+/5 in all planes to promote  ankle stability with </= 2/10 pain (01/24/2016)   Time 8   Period Weeks   Status New     PT LONG TERM GOAL #4   Title she will be able to walk / stand for >/= 30 min with </= 2/10 pain to promote functional walking/ standing endurnace required for work and ADLs (01/24/2016)   Time 8   Period Weeks   Status New     PT LONG TERM GOAL #5   Title improve LEFS score to >/= 50 to demonstrate increase in function (01/24/2016)   Time 8   Period Weeks   Status New               Plan - 12/20/15 1840    Clinical Impression Statement ankle tissue stiff.  She was able to exercise appropriately.  She could benifit from more soft tissue work.  She will be gone about 2 weeks.  Needs to see a PT.   PT Next Visit Plan mobs to increase ankle mobility, stretching, ankle strengthening, seated baps/ rocker board, gait / stair training, try nu-step.  See a PT thanks   PT Home Exercise Plan continue   Consulted and Agree with Plan of Care Patient      Patient will benefit from skilled therapeutic intervention in order to improve the following deficits and impairments:     Visit Diagnosis: Pain in left ankle and joints of left foot  Localized edema  Stiffness of left ankle, not elsewhere classified  Other abnormalities of gait and mobility  Muscle weakness (generalized)     Problem List Patient Active Problem List   Diagnosis Date Noted  . Solitary pulmonary nodule 10/12/2015  . Closed trimalleolar fracture of left ankle 08/24/2015  . Ankle fracture, left 08/24/2015  . Trimalleolar fracture of left ankle 08/24/2015  . Family history of breast cancer in female 12/28/2014  . Family history of ovarian cancer 12/28/2014  . Genetic testing 12/07/2014  . Restless leg syndrome 03/27/2014  . Disturbance of skin sensation 10/20/2013  . ADENOCARCINOMA, BREAST, LEFT 04/10/2008  . ANXIETY 04/10/2008  . 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 GERD 04/10/2008  . DIVERTICULOSIS, COLON 04/10/2008  .  ARTHRITIS 04/10/2008    HARRIS,KAREN  12/20/2015, 6:44 PM  [place], [place], [address] Phone: [phone]   Fax:  [phone]  Name: KHARTER SESTAK MRN: [id number] Date of Birth: June 14, 1945   Melvenia Needles, PTA 12/20/15 6:44 PM Phone: [phone] Fax: [phone]    PHYSICAL THERAPY DISCHARGE SUMMARY  Visits from Start of Care: 5  Current functional level related to goals / functional outcomes: See goals   Remaining deficits: Unknown    Education / Equipment: HEP, therband for strengthening, anatomy education  Plan: Patient agrees to discharge.  Patient goals were not met. Patient is being discharged due to not returning since the last visit.  ?????     Kristoffer Leamon PT, DPT, LAT, ATC  04/03/16  12:14 PM

## 2016-01-17 ENCOUNTER — Other Ambulatory Visit: Payer: Medicare Other

## 2016-01-17 ENCOUNTER — Encounter: Payer: Medicare Other | Admitting: Cardiothoracic Surgery

## 2016-01-24 ENCOUNTER — Encounter: Payer: Medicare Other | Admitting: Cardiothoracic Surgery

## 2016-01-24 ENCOUNTER — Ambulatory Visit
Admission: RE | Admit: 2016-01-24 | Discharge: 2016-01-24 | Disposition: A | Discharge status: Home / Self Care | Payer: Medicare Other | Source: Ambulatory Visit | Attending: Cardiothoracic Surgery | Admitting: Cardiothoracic Surgery

## 2016-01-24 DIAGNOSIS — R911 Solitary pulmonary nodule: Secondary | ICD-10-CM

## 2016-01-29 ENCOUNTER — Encounter: Payer: Self-pay | Admitting: Cardiothoracic Surgery

## 2016-01-29 ENCOUNTER — Ambulatory Visit (INDEPENDENT_AMBULATORY_CARE_PROVIDER_SITE_OTHER): Payer: Medicare Other | Admitting: Cardiothoracic Surgery

## 2016-01-29 VITALS — BP 158/81 | HR 106 | Resp 16 | Ht 64.0 in | Wt 171.0 lb

## 2016-01-29 DIAGNOSIS — R911 Solitary pulmonary nodule: Secondary | ICD-10-CM | POA: Diagnosis not present

## 2016-01-29 DIAGNOSIS — E041 Nontoxic single thyroid nodule: Secondary | ICD-10-CM | POA: Diagnosis not present

## 2016-01-29 NOTE — Progress Notes (Signed)
[address]       [place] [address]             [phone]                    [name] [place] Medical Record #[id number] Date of Birth: [birth year]  Referring: Rigoberto Noel, MD Primary Care: Gennette Pac, MD  Chief Complaint:    Chief Complaint  Patient presents with  . Lung Lesion    3 month f/u with CT CHEST    History of Present Illness:    Meghan [name] 70 y.o. female is seen in the office  today in follow up to for Abnormal CT PET scan. Patient originally had injury in April when she fell fracturing her right ankle and landing on her right lower chest. Following this she noted some pleuritic chest pain on the right. Because of this she requested a chest x-ray to be done at her preop visit for the repair of her left ankle fracture. The chest x-ray suggested an anterior mass. Last week a CT scan of the chest was performed to follow-up on this and showed a pleural-based approximate 2 cm in size, CT scan did not suggest any other evidence of lung malignancy. 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   She has been a lifelong nonsmoker, in the past both of her parents smoked and her husband smoked in the past.  Needle biopsy of right lung lesion done, inflammatory mass. Now returns with follow up CT of chest.  Now out of walking boot, still with ankle swelling, no respiratory complaints.  Current Activity/ Functional Status:  Patient is independent with mobility/ambulation, transfers, ADL's, IADL's. With some help and with crutches due to recent injury   Zubrod Score: At the time of surgery this patient's most appropriate activity status/level should be described as: []     0    Normal activity, no symptoms [x]     1    Restricted in physical strenuous activity but ambulatory, able to do out light work []     2    Ambulatory and capable of self care, unable to do work  activities, up and about               >50 % of waking hours                              []     3    Only limited self care, in bed greater than 50% of waking hours []     4    Completely disabled, no self care, confined to bed or chair []     5    Moribund   Past Medical History:  Diagnosis Date  . Arthritis   . 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 Colon polyps    Hyperplastic   . Diverticulosis of colon (without mention of hemorrhage)   . Esophageal stricture   . GERD (gastroesophageal reflux disease)   . History of radiation therapy   . History of urinary tract infection   . Neuromuscular disorder ([place])    peripheral nueropathy  . Numbness    feet bilat at nighttime  . Restless leg syndrome 03/27/2014  . Restless legs syndrome (RLS)   . Toe pain,  right 09/2014   for 2 wks  . Urinary urgency     Past Surgical History:  Procedure Laterality Date  . BREAST SURGERY     left 2006  . CESAREAN SECTION    . ORIF ANKLE FRACTURE Left 08/24/2015   Procedure: OPEN REDUCTION INTERNAL FIXATION (ORIF) LEFT ANKLE FRACTURE;  Surgeon: Mcarthur Rossetti, MD;  Location: [facility];  Service: Orthopedics;  Laterality: Left;  General with a popliteal block    Family History  Problem Relation Age of Onset  . COPD Father   . Heart disease Father   . Emphysema Father     smoker  . Breast cancer Sister 74    mastectomy; metastasis to liver and lung  . Heart disease Maternal Grandmother   . Congestive Heart Failure Maternal Grandmother   . Heart disease Paternal Grandmother   . Obesity Brother   . Heart attack Mother   . Breast cancer Maternal Aunt     dx. late 34s  . Alzheimer's disease Maternal Aunt   . Breast cancer Maternal Aunt     dx. 22s  . Squamous cell carcinoma Maternal Aunt     recently removed; dx. 95  . Breast cancer Cousin 62    genetic testing in approx 2015  . Ovarian cancer Cousin     dx. 58s  . Esophageal cancer Cousin      not a smoker  . Neuropathy Neg Hx   . Thyroid disease Neg Hx     Social History   Social History  . Marital status: Married    Spouse name: N/A  . Number of children: 2  . Years of education: AS   Occupational History  . 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 Smoking status: Never Smoker  . Smokeless tobacco: Never Used  . Alcohol use 0.0 oz/week     Comment: very rarely - wine  . Drug use: No  . Sexual activity: Yes   Other Topics Concern  . Not on file   Social History Narrative   Lives at home w/ her husband   Right-handed   Occasionally drinks tea    History  Smoking Status  . Never Smoker  Smokeless Tobacco  . Never Used    History  Alcohol Use  . 0.0 oz/week    Comment: very rarely - wine     Allergies  Allergen Reactions  . Nsaids Other (See Comments)    CKD - per PCP    Current Outpatient Prescriptions  Medication Sig Dispense Refill  . Calcium Citrate (CITRACAL PO) Take 600 mg by mouth daily.     . Cholecalciferol (VITAMIN D3) 2000 units TABS Take 4,000 Units by mouth daily.     . clidinium-chlordiazePOXIDE (LIBRAX) 5-2.5 MG per capsule Take 1 capsule by mouth as needed. (Patient taking differently: Take 1 capsule by mouth every 6 (six) hours as needed (for divertulitis flares). ) 100 capsule 3  . Cranberry-Vitamin C-Probiotic (AZO CRANBERRY) 250-30 MG TABS Take 1 tablet by mouth 2 (two) times daily.     Marland Kitchen FIBER SELECT GUMMIES PO Take 2 tablets by mouth at bedtime.     . fluticasone (FLONASE) 50 MCG/ACT nasal spray Place 1 spray into both nostrils daily as needed for allergies or rhinitis.     Marland Kitchen gabapentin (NEURONTIN) 300 MG capsule Take 2 capsules (600 mg total) by mouth at bedtime. 180 capsule 2  . Misc Natural  Products (OSTEO BI-FLEX ADV TRIPLE ST) TABS Take 1 tablet by mouth 2 (two) times daily.    . Multiple Vitamin (MULTIVITAMIN) tablet Take 1 tablet by mouth every evening.     . Omega-3 Fatty  Acids (FISH OIL) 1200 MG CAPS Take 1,200 mg by mouth daily.     . pantoprazole (PROTONIX) 40 MG tablet Take 40 mg by mouth daily.    . pramipexole (MIRAPEX) 0.5 MG tablet Take 1 tablet (0.5 mg total) by mouth at bedtime. 90 tablet 1   No current facility-administered medications for this visit.       Review of Systems:     Cardiac Review of Systems: Y or N  Chest Pain Florencio.Farrier    ]  Resting SOB [ n  ] Exertional SOB  [ n ]  Orthopnea [ n ]   Pedal Edema [ n  ]    Palpitations [ n ] Syncope  [ n ]   Presyncope [ n  ]  General Review of Systems: [Y] = yes [  ]=no Constitional: recent weight change [  ];  Wt loss over the last 3 months [   ] anorexia [  ]; fatigue [  ]; nausea [  ]; night sweats [  ]; fever [  ]; or chills [  ];          Dental: poor dentition[  ]; Last Dentist visit:   Eye : blurred vision [  ]; diplopia [   ]; vision changes [  ];  Amaurosis fugax[  ]; Resp: cough [  ];  wheezing[  ];  hemoptysis[  ]; shortness of breath[  ]; paroxysmal nocturnal dyspnea[  ]; dyspnea on exertion[  ]; or orthopnea[  ];  GI:  gallstones[  ], vomiting[  ];  dysphagia[  ]; melena[  ];  hematochezia [  ]; heartburn[  ];   Hx of  Colonoscopy[  ]; GU: kidney stones [  ]; hematuria[  ];   dysuria [  ];  nocturia[  ];  history of     obstruction [  ]; urinary frequency [  ]             Skin: rash, swelling[  ];, hair loss[  ];  peripheral edema[  ];  or itching[  ]; Musculosketetal: myalgias[  ];  joint swelling[  ];  joint erythema[  ];  joint pain[  ];  back pain[  ];  Heme/Lymph: bruising[  ];  bleeding[  ];  anemia[  ];  Neuro: TIA[  ];  headaches[  ];  stroke[  ];  vertigo[  ];  seizures[  ];   paresthesias[  ];  difficulty walking[ y ];  Psych:depression[  ]; anxiety[  ];  Endocrine: diabetes[  ];  thyroid dysfunction[  ];  Immunizations: Flu up to date [ y ]; Pneumococcal up to date [ y ];  Other:  Physical Exam: BP (!) 158/81   Pulse (!) 106   Resp 16   Ht 5' 4"  (1.626 m)   Wt 171 lb (77.6  kg)   SpO2 98% Comment: ON RA  BMI 29.35 kg/m   PHYSICAL EXAMINATION: General appearance: alert, cooperative and appears stated age Head: Normocephalic, without obvious abnormality, atraumatic Neck: no adenopathy, no carotid bruit, no JVD, supple, symmetrical, trachea midline and thyroid not enlarged, symmetric, no tenderness/mass/nodules Lymph nodes: Cervical, supraclavicular, and axillary nodes normal. Resp: clear to auscultation bilaterally Back: symmetric, no curvature. ROM normal. No CVA  tenderness. Cardio: regular rate and rhythm, S1, S2 normal, no murmur, click, rub or gallop GI: soft, non-tender; bowel sounds normal; no masses,  no organomegaly Extremities: extremities normal, atraumatic, no cyanosis or edema and Homans sign is negative, no sign of DVT, left ankle swollen , walking without  Boot.  Neurologic: Grossly normal    Diagnostic Studies & Laboratory data:     Recent Radiology Findings:   Ct Chest Wo Contrast  Result Date: 01/24/2016 CLINICAL DATA:  History of left breast cancer. Follow-up right lower lobe pulmonary nodule. Percutaneous biopsy of this nodule on 10/25/2015 demonstrated benign reactive fibrosis with no evidence of malignancy. EXAM: CT CHEST WITHOUT CONTRAST TECHNIQUE: Multidetector CT imaging of the chest was performed following the standard protocol without IV contrast. COMPARISON:  10/18/2015 PET-CT and 10/05/2015 chest CT. FINDINGS: Motion degraded scan. Cardiovascular: Normal heart size. No significant pericardial fluid/thickening. Left main, left anterior descending and right coronary atherosclerosis. Atherosclerotic nonaneurysmal thoracic aorta. Normal caliber pulmonary arteries. Mediastinum/Nodes: Stable hypodense bilateral thyroid nodules, largest 1.9 cm on the right. Unremarkable esophagus. No pathologically enlarged axillary, mediastinal or gross hilar lymph nodes, noting limited sensitivity for the detection of hilar adenopathy on this noncontrast  study. Lungs/Pleura: No pneumothorax. No pleural effusion. Subpleural basilar right lower lobe 1.5 x 0.6 cm nodular opacity (series 4/ image 87), decreased from 2.3 x 1.0 cm on 10/05/2015 using similar measurement technique. Previously described 3 mm left lower lobe pulmonary nodule on the 10/05/2015 chest CT study is not appreciated on today's motion degraded scan. There is a new 1.0 x 1.0 cm left lower lobe pulmonary nodule (series 4/ image 70). No acute consolidative airspace disease or additional significant pulmonary nodules. Upper abdomen: Colonic diverticulosis. Musculoskeletal: No aggressive appearing focal osseous lesions. Mild thoracic spondylosis. IMPRESSION: 1. Right lower lobe subpleural nodular opacity has decreased in size since 10/05/2015, most consistent with a resolving inflammatory process. 2. New 1.0 cm left lower lobe pulmonary nodule. The appearance of this nodule in the relatively short interval since the 10/18/2015 PET-CT is suggestive of an infectious or inflammatory process, however neoplasm is not entirely excluded. A follow-up chest CT in 3 months is advised. Alternatively, PET-CT or tissue sampling could be obtained as clinically warranted. 3. No thoracic adenopathy or other findings of metastatic disease in the chest. 4. Additional findings include aortic atherosclerosis, left main and two-vessel coronary atherosclerosis and colonic diverticulosis. Electronically Signed   By: Ilona Sorrel M.D.   On: 01/24/2016 10:37     I have independently reviewed the above radiology studies  and reviewed the findings with the patient.   Recent Lab Findings: Lab Results  Component Value Date   WBC 5.0 10/25/2015   HGB 13.4 10/25/2015   HCT 41.5 10/25/2015   PLT 230 10/25/2015   GLUCOSE 122 (H) 08/22/2015   ALT 29 09/05/2009   AST 31 09/05/2009   NA 139 08/22/2015   K 4.3 08/22/2015   CL 105 08/22/2015   CREATININE 1.08 (H) 08/22/2015   BUN 21 (H) 08/22/2015   CO2 24 08/22/2015    INR 1.03 10/25/2015      Assessment / Plan:     New 1.0 cm left lower lobe pulmonary nodule. The appearance of this nodule in the relatively short interval since the 10/18/2015 PET-CT is suggestive of an infectious or inflammatory process  Follow up ct of chest 3 months    Grace Isaac MD      [address] [place] [address] Office [phone]  Beeper [phone]  01/29/2016 3:26 PM

## 2016-02-11 ENCOUNTER — Other Ambulatory Visit: Payer: Self-pay | Admitting: *Deleted

## 2016-02-11 DIAGNOSIS — R911 Solitary pulmonary nodule: Secondary | ICD-10-CM

## 2016-02-28 ENCOUNTER — Encounter: Payer: Medicare Other | Admitting: Cardiothoracic Surgery

## 2016-02-28 ENCOUNTER — Other Ambulatory Visit: Payer: Medicare Other

## 2016-03-18 DIAGNOSIS — H52203 Unspecified astigmatism, bilateral: Secondary | ICD-10-CM | POA: Diagnosis not present

## 2016-03-18 DIAGNOSIS — H43813 Vitreous degeneration, bilateral: Secondary | ICD-10-CM | POA: Diagnosis not present

## 2016-03-23 ENCOUNTER — Encounter: Payer: Self-pay | Admitting: Internal Medicine

## 2016-04-03 ENCOUNTER — Encounter: Payer: Self-pay | Admitting: Cardiothoracic Surgery

## 2016-04-03 ENCOUNTER — Ambulatory Visit
Admission: RE | Admit: 2016-04-03 | Discharge: 2016-04-03 | Disposition: A | Discharge status: Home / Self Care | Payer: Medicare Other | Source: Ambulatory Visit | Attending: Cardiothoracic Surgery | Admitting: Cardiothoracic Surgery

## 2016-04-03 ENCOUNTER — Ambulatory Visit (INDEPENDENT_AMBULATORY_CARE_PROVIDER_SITE_OTHER): Payer: Medicare Other | Admitting: Cardiothoracic Surgery

## 2016-04-03 VITALS — BP 139/78 | HR 80 | Resp 16 | Ht 64.0 in | Wt 174.0 lb

## 2016-04-03 DIAGNOSIS — E041 Nontoxic single thyroid nodule: Secondary | ICD-10-CM | POA: Diagnosis not present

## 2016-04-03 DIAGNOSIS — R911 Solitary pulmonary nodule: Secondary | ICD-10-CM

## 2016-04-03 DIAGNOSIS — R918 Other nonspecific abnormal finding of lung field: Secondary | ICD-10-CM | POA: Diagnosis not present

## 2016-04-03 NOTE — Progress Notes (Signed)
[address]       [place] [address]             [phone]                    [name] Medical Record #[id number] Date of Birth: 1945-08-25  Referring: Rigoberto Noel, MD Primary Care: Gennette Pac, MD  Chief Complaint:    Chief Complaint  Patient presents with  . Lung Lesion    2 month f/u with Chest CT- super D    History of Present Illness:    Meghan [name] 70 y.o. female is seen in the office  today in follow up to for Abnormal CT PET scan. Patient originally had injury in April 2017 when she fell fracturing her right ankle and landing on her right lower chest. Following this she noted some pleuritic chest pain on the right. Because of this she requested a chest x-ray to be done at her preop visit for the repair of her left ankle fracture. The chest x-ray suggested an anterior mass Right chest. A CT scan of the chest was performed to follow-up on this and showed a pleural-based approximate 2 cm in size, CT scan did not suggest any other evidence of lung malignancy. 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 Needle biopsy of right lung lesion done, inflammatory mass. A follow-up CT scan in September 2017  showed Right lower lobe subpleural nodular opacity has decreased in size since 10/05/2015, most consistent with a resolving inflammatory process. 2. New 1.0 cm left lower lobe pulmonary nodule. The appearance of this nodule in the relatively short interval since the 10/18/2015 PET-CT is suggestive of an infectious or inflammatory process, however neoplasm is not entirely excluded. Patient returns today with a follow-up CT scan 3 months after the one done September 2017.   She denies any cough or respiratory symptoms since last seen.  She has been a lifelong nonsmoker, in the past both of her parents smoked and her husband smoked in the past.  Current Activity/ Functional  Status:  Patient is independent with mobility/ambulation, transfers, ADL's, IADL's. With some help and with crutches due to recent injury   Zubrod Score: At the time of surgery this patient's most appropriate activity status/level should be described as: []     0    Normal activity, no symptoms [x]     1    Restricted in physical strenuous activity but ambulatory, able to do out light work []     2    Ambulatory and capable of self care, unable to do work activities, up and about               >50 % of waking hours                              []     3    Only limited self care, in bed greater than 50% of waking hours []     4    Completely disabled, no self care, confined to bed or chair []     5    Moribund   Past Medical History:  Diagnosis Date  . Arthritis   . 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 Colon polyps    Hyperplastic   .  Diverticulosis of colon (without mention of hemorrhage)   . Esophageal stricture   . GERD (gastroesophageal reflux disease)   . History of radiation therapy   . History of urinary tract infection   . Neuromuscular disorder ([place])    peripheral nueropathy  . Numbness    feet bilat at nighttime  . Restless leg syndrome 03/27/2014  . Restless legs syndrome (RLS)   . Toe pain, right 09/2014   for 2 wks  . Urinary urgency     Past Surgical History:  Procedure Laterality Date  . BREAST SURGERY     left 2006  . CESAREAN SECTION    . ORIF ANKLE FRACTURE Left 08/24/2015   Procedure: OPEN REDUCTION INTERNAL FIXATION (ORIF) LEFT ANKLE FRACTURE;  Surgeon: Mcarthur Rossetti, MD;  Location: [facility];  Service: Orthopedics;  Laterality: Left;  General with a popliteal block    Family History  Problem Relation Age of Onset  . COPD Father   . Heart disease Father   . Emphysema Father     smoker  . Breast cancer Sister 65    mastectomy; metastasis to liver and lung  . Heart disease Maternal Grandmother   .  Congestive Heart Failure Maternal Grandmother   . Heart disease Paternal Grandmother   . Obesity Brother   . Heart attack Mother   . Breast cancer Maternal Aunt     dx. late 67s  . Alzheimer's disease Maternal Aunt   . Breast cancer Maternal Aunt     dx. 86s  . Squamous cell carcinoma Maternal Aunt     recently removed; dx. 95  . Breast cancer Cousin 62    genetic testing in approx 2015  . Ovarian cancer Cousin     dx. 40s  . Esophageal cancer Cousin     not a smoker  . Neuropathy Neg Hx   . Thyroid disease Neg Hx     Social History   Social History  . Marital status: Married    Spouse name: N/A  . Number of children: 2  . Years of education: AS   Occupational History  . 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 Smoking status: Never Smoker  . Smokeless tobacco: Never Used  . Alcohol use 0.0 oz/week     Comment: very rarely - wine  . Drug use: No  . Sexual activity: Yes   Other Topics Concern  . Not on file   Social History Narrative   Lives at home w/ her husband   Right-handed   Occasionally drinks tea    History  Smoking Status  . Never Smoker  Smokeless Tobacco  . Never Used    History  Alcohol Use  . 0.0 oz/week    Comment: very rarely - wine     Allergies  Allergen Reactions  . Gabapentin Other (See Comments)  . Nsaids Other (See Comments)    CKD - per PCP  . Tetracycline Hcl Other (See Comments)    Current Outpatient Prescriptions  Medication Sig Dispense Refill  . Calcium Citrate (CITRACAL PO) Take 600 mg by mouth daily.     . Cholecalciferol (VITAMIN D3) 2000 units TABS Take 4,000 Units by mouth daily.     . clidinium-chlordiazePOXIDE (LIBRAX) 5-2.5 MG per capsule Take 1 capsule by mouth as needed. (Patient taking differently: Take 1 capsule by mouth every 6 (six) hours as needed (for divertulitis flares). ) 100 capsule 3  .  Cranberry-Vitamin C-Probiotic (AZO CRANBERRY) 250-30 MG TABS Take 1  tablet by mouth 2 (two) times daily.     Marland Kitchen FIBER SELECT GUMMIES PO Take 2 tablets by mouth at bedtime.     . fluticasone (FLONASE) 50 MCG/ACT nasal spray Place 1 spray into both nostrils daily as needed for allergies or rhinitis.     Marland Kitchen gabapentin (NEURONTIN) 300 MG capsule Take 2 capsules (600 mg total) by mouth at bedtime. 180 capsule 2  . Misc Natural Products (OSTEO BI-FLEX ADV TRIPLE ST) TABS Take 1 tablet by mouth 2 (two) times daily.    . Multiple Vitamin (MULTIVITAMIN) tablet Take 1 tablet by mouth every evening.     . Omega-3 Fatty Acids (FISH OIL) 1200 MG CAPS Take 1,200 mg by mouth daily.     . pantoprazole (PROTONIX) 40 MG tablet Take 40 mg by mouth daily.    . pramipexole (MIRAPEX) 0.5 MG tablet Take 1 tablet (0.5 mg total) by mouth at bedtime. 90 tablet 1   No current facility-administered medications for this visit.       Review of Systems:     Cardiac Review of Systems: Y or N  Chest Pain Florencio.Farrier    ]  Resting SOB [ n  ] Exertional SOB  [ n ]  Orthopnea [ n ]   Pedal Edema [ n  ]    Palpitations [ n ] Syncope  [ n ]   Presyncope [ n  ]  General Review of Systems: [Y] = yes [  ]=no Constitional: recent weight change [  ];  Wt loss over the last 3 months [   ] anorexia [  ]; fatigue [  ]; nausea [  ]; night sweats [  ]; fever [  ]; or chills [  ];          Dental: poor dentition[  ]; Last Dentist visit:   Eye : blurred vision [  ]; diplopia [   ]; vision changes [  ];  Amaurosis fugax[  ]; Resp: cough [  ];  wheezing[  ];  hemoptysis[  ]; shortness of breath[  ]; paroxysmal nocturnal dyspnea[  ]; dyspnea on exertion[  ]; or orthopnea[  ];  GI:  gallstones[  ], vomiting[  ];  dysphagia[  ]; melena[  ];  hematochezia [  ]; heartburn[  ];   Hx of  Colonoscopy[  ]; GU: kidney stones [  ]; hematuria[  ];   dysuria [  ];  nocturia[  ];  history of     obstruction [  ]; urinary frequency [  ]             Skin: rash, swelling[  ];, hair loss[  ];  peripheral edema[  ];  or itching[   ]; Musculosketetal: myalgias[  ];  joint swelling[  ];  joint erythema[  ];  joint pain[  ];  back pain[  ];  Heme/Lymph: bruising[  ];  bleeding[  ];  anemia[  ];  Neuro: TIA[  ];  headaches[  ];  stroke[  ];  vertigo[  ];  seizures[  ];   paresthesias[  ];  difficulty walking[ y ];  Psych:depression[  ]; anxiety[  ];  Endocrine: diabetes[  ];  thyroid dysfunction[  ];  Immunizations: Flu up to date [ y ]; Pneumococcal up to date [ y ];  Other:  Physical Exam: BP 139/78 (BP Location: Right Arm, Patient Position: Sitting, Cuff Size: Large)  Pulse 80   Resp 16   Ht 5' 4"  (1.626 m)   Wt 174 lb (78.9 kg)   SpO2 96% Comment: RA  BMI 29.87 kg/m   PHYSICAL EXAMINATION: General appearance: alert, cooperative and appears stated age Head: Normocephalic, without obvious abnormality, atraumatic Neck: no adenopathy, no carotid bruit, no JVD, supple, symmetrical, trachea midline and thyroid not enlarged, symmetric, no tenderness/mass/nodules Lymph nodes: Cervical, supraclavicular, and axillary nodes normal. Resp: clear to auscultation bilaterally Back: symmetric, no curvature. ROM normal. No CVA tenderness. Cardio: regular rate and rhythm, S1, S2 normal, no murmur, click, rub or gallop GI: soft, non-tender; bowel sounds normal; no masses,  no organomegaly Extremities: extremities normal, atraumatic, no cyanosis or edema and Homans sign is negative, no sign of DVT, left ankle swollen mildly swollen with incision on the lateral aspect, she notes numbness around the incision Neurologic: Grossly normal    Diagnostic Studies & Laboratory data:     Recent Radiology Findings:  Ct Super D Chest Wo Contrast  Result Date: 04/03/2016 CLINICAL DATA:  Pulmonary nodules. Personal history of left breast carcinoma and previous radiation therapy. EXAM: CT CHEST WITHOUT CONTRAST TECHNIQUE: Multidetector CT imaging of the chest was performed using thin slice collimation for electromagnetic bronchoscopy  planning purposes, without intravenous contrast. COMPARISON:  01/24/2016 and 10/05/2015 FINDINGS: Cardiovascular:  No acute findings. Mediastinum/Nodes: No pathologically enlarged lymph nodes identified on this unenhanced exam. Bilateral thyroid nodules remain stable, largest in the right lobe measuring 1.8 cm. Lungs/Pleura: Previously seen posterior left lower lobe pulmonary nodule has nearly completely resolved since previous study, consistent with resolving inflammatory or infectious process. Subpleural nodular opacity in posterior right costophrenic sulcus measures 0.6 x 1.5 cm on image 88/4 and is stable since most recent study on 01/24/2016. This showed decrease in size compared to earlier exam on 10/05/2015, also consistent with benign etiology. No evidence of pleural effusion. Upper Abdomen:  Unremarkable. Musculoskeletal: No suspicious bone lesions or other significant abnormality. IMPRESSION: Near complete resolution of posterior left lower lobe pulmonary nodule since 01/24/2016, consistent with resolving inflammatory or infectious process. Subpleural nodular opacity in posterior right costophrenic sulcus is unchanged since 01/24/2016 but shows decreased size compared to earlier CT on 10/05/2015, also consistent with benign etiology. No acute findings. No evidence of lymphadenopathy or pleural effusion. Electronically Signed   By: Earle Gell M.D.   On: 04/03/2016 09:56    Ct Chest Wo Contrast  Result Date: 01/24/2016 CLINICAL DATA:  History of left breast cancer. Follow-up right lower lobe pulmonary nodule. Percutaneous biopsy of this nodule on 10/25/2015 demonstrated benign reactive fibrosis with no evidence of malignancy. EXAM: CT CHEST WITHOUT CONTRAST TECHNIQUE: Multidetector CT imaging of the chest was performed following the standard protocol without IV contrast. COMPARISON:  10/18/2015 PET-CT and 10/05/2015 chest CT. FINDINGS: Motion degraded scan. Cardiovascular: Normal heart size. No  significant pericardial fluid/thickening. Left main, left anterior descending and right coronary atherosclerosis. Atherosclerotic nonaneurysmal thoracic aorta. Normal caliber pulmonary arteries. Mediastinum/Nodes: Stable hypodense bilateral thyroid nodules, largest 1.9 cm on the right. Unremarkable esophagus. No pathologically enlarged axillary, mediastinal or gross hilar lymph nodes, noting limited sensitivity for the detection of hilar adenopathy on this noncontrast study. Lungs/Pleura: No pneumothorax. No pleural effusion. Subpleural basilar right lower lobe 1.5 x 0.6 cm nodular opacity (series 4/ image 87), decreased from 2.3 x 1.0 cm on 10/05/2015 using similar measurement technique. Previously described 3 mm left lower lobe pulmonary nodule on the 10/05/2015 chest CT study is not appreciated on today's motion degraded scan.  There is a new 1.0 x 1.0 cm left lower lobe pulmonary nodule (series 4/ image 70). No acute consolidative airspace disease or additional significant pulmonary nodules. Upper abdomen: Colonic diverticulosis. Musculoskeletal: No aggressive appearing focal osseous lesions. Mild thoracic spondylosis. IMPRESSION: 1. Right lower lobe subpleural nodular opacity has decreased in size since 10/05/2015, most consistent with a resolving inflammatory process. 2. New 1.0 cm left lower lobe pulmonary nodule. The appearance of this nodule in the relatively short interval since the 10/18/2015 PET-CT is suggestive of an infectious or inflammatory process, however neoplasm is not entirely excluded. A follow-up chest CT in 3 months is advised. Alternatively, PET-CT or tissue sampling could be obtained as clinically warranted. 3. No thoracic adenopathy or other findings of metastatic disease in the chest. 4. Additional findings include aortic atherosclerosis, left main and two-vessel coronary atherosclerosis and colonic diverticulosis. Electronically Signed   By: Ilona Sorrel M.D.   On: 01/24/2016 10:37       I have independently reviewed the above radiology studies  and reviewed the findings with the patient.   Recent Lab Findings: Lab Results  Component Value Date   WBC 5.0 10/25/2015   HGB 13.4 10/25/2015   HCT 41.5 10/25/2015   PLT 230 10/25/2015   GLUCOSE 122 (H) 08/22/2015   ALT 29 09/05/2009   AST 31 09/05/2009   NA 139 08/22/2015   K 4.3 08/22/2015   CL 105 08/22/2015   CREATININE 1.08 (H) 08/22/2015   BUN 21 (H) 08/22/2015   CO2 24 08/22/2015   INR 1.03 10/25/2015      Assessment / Plan:    Near complete resolution of posterior left lower lobe pulmonary nodule since 01/24/2016, consistent with resolving inflammatory or infectious process. Pleural-based area in the right chest appears slightly smaller, this lesion had previously been biopsied and was inflammatory. Follow up ct of chest 7-6 months  months    Grace Isaac MD      [address] [place] [address] Office [phone]   Beeper [phone]  04/03/2016 10:39 AM

## 2016-04-04 ENCOUNTER — Encounter: Payer: Self-pay | Admitting: Nurse Practitioner

## 2016-04-04 ENCOUNTER — Ambulatory Visit (INDEPENDENT_AMBULATORY_CARE_PROVIDER_SITE_OTHER): Payer: Medicare Other | Admitting: Nurse Practitioner

## 2016-04-04 VITALS — BP 146/88 | HR 93 | Ht 64.0 in | Wt 192.0 lb

## 2016-04-04 DIAGNOSIS — R209 Unspecified disturbances of skin sensation: Secondary | ICD-10-CM

## 2016-04-04 DIAGNOSIS — G2581 Restless legs syndrome: Secondary | ICD-10-CM | POA: Diagnosis not present

## 2016-04-04 MED ORDER — PRAMIPEXOLE DIHYDROCHLORIDE 0.5 MG PO TABS: 0.5000 mg | ORAL_TABLET | Freq: Every day | ORAL | 2 refills | Status: DC

## 2016-04-04 NOTE — Patient Instructions (Signed)
Continue Mirapex at current dose will refill Continue gabapentin at current dose does not need refills Follow-up in 6-8 months

## 2016-04-04 NOTE — Progress Notes (Signed)
I have read the note, and I agree with the clinical assessment and plan.  Dinia Joynt KEITH   

## 2016-04-04 NOTE — Progress Notes (Signed)
[facility]  PATIENT: Meghan [name] DOB: [birth year]   REASON FOR VISIT: Follow-up for restless legs  HISTORY FROM: Patient    HISTORY OF PRESENT ILLNESS:UPDATE 12/01/2017CM Meghan [name] 70 year old female returns for follow-up. She has a history of restless leg syndrome and is currently on Mirapex with good relief. She finished her therapy for her fractured ankle on the left leg back in August that she continues to have some swelling and a mild limp when walking. She is not nearly as active and has gained some weight. She claims she tries to walk for exercise but after about 10 minutes her left foot begins to bother her and she stops. She is going to to the gym to work on upper body strength and does ride the bicycle. She continues to complain of some numbness in the left foot. She returns for reevaluation   10/04/15 Meghan [name] is a 70 year old right-handed white female with a history of restless leg syndrome. The symptoms are primarily at nighttime. She has been seen initially for paresthesias on all fours, but this issue has completely resolved. The patient has been on gabapentin for the restless legs, this has not been completely effective. She takes 600 mg at night but still has restless leg symptoms. She fell 6 weeks ago and has had compound fractures of the left lower leg requiring surgery. The patient is recovering from this, she has not yet weightbearing on the left leg. The patient returns this office for an evaluation. She is having some pain around the surgical site.  REVIEW OF SYSTEMS: Full 14 system review of systems performed and notable only for those listed, all others are neg:  Constitutional: neg  Cardiovascular: neg Ear/Nose/Throat: neg  Skin: neg Eyes: neg Respiratory: neg Gastroitestinal: neg  Hematology/Lymphatic: neg  Endocrine: neg Musculoskeletal:neg Allergy/Immunology: neg Neurological: neg Psychiatric: neg Sleep : Restless leg  syndrome   ALLERGIES: Allergies  Allergen Reactions  . Gabapentin Other (See Comments)  . Nsaids Other (See Comments)    CKD - per PCP  . Tetracycline Hcl Other (See Comments)    HOME MEDICATIONS: Outpatient Medications Prior to Visit  Medication Sig Dispense Refill  . Calcium Citrate (CITRACAL PO) Take 600 mg by mouth daily.     . Cholecalciferol (VITAMIN D3) 2000 units TABS Take 4,000 Units by mouth daily.     . Cranberry-Vitamin C-Probiotic (AZO CRANBERRY) 250-30 MG TABS Take 1 tablet by mouth 2 (two) times daily.     Marland Kitchen FIBER SELECT GUMMIES PO Take 2 tablets by mouth at bedtime.     . fluticasone (FLONASE) 50 MCG/ACT nasal spray Place 1 spray into both nostrils daily as needed for allergies or rhinitis.     Marland Kitchen gabapentin (NEURONTIN) 300 MG capsule Take 2 capsules (600 mg total) by mouth at bedtime. 180 capsule 2  . Misc Natural Products (OSTEO BI-FLEX ADV TRIPLE ST) TABS Take 1 tablet by mouth 2 (two) times daily.    . Multiple Vitamin (MULTIVITAMIN) tablet Take 1 tablet by mouth every evening.     . Omega-3 Fatty Acids (FISH OIL) 1200 MG CAPS Take 1,200 mg by mouth daily.     . pantoprazole (PROTONIX) 40 MG tablet Take 40 mg by mouth daily.    . pramipexole (MIRAPEX) 0.5 MG tablet Take 1 tablet (0.5 mg total) by mouth at bedtime. 90 tablet 1  . clidinium-chlordiazePOXIDE (LIBRAX) 5-2.5 MG per capsule Take 1 capsule by mouth as needed. (Patient taking differently: Take 1 capsule by mouth  every 6 (six) hours as needed (for divertulitis flares). ) 100 capsule 3   No facility-administered medications prior to visit.     PAST MEDICAL HISTORY: Past Medical History:  Diagnosis Date  . Arthritis   . 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 Colon polyps    Hyperplastic   . Diverticulosis of colon (without mention of hemorrhage)   . Esophageal stricture   . GERD (gastroesophageal reflux disease)   . History of radiation therapy   . History of  urinary tract infection   . Neuromuscular disorder ([place])    peripheral nueropathy  . Numbness    feet bilat at nighttime  . Restless leg syndrome 03/27/2014  . Restless legs syndrome (RLS)   . Toe pain, right 09/2014   for 2 wks  . Urinary urgency     PAST SURGICAL HISTORY: Past Surgical History:  Procedure Laterality Date  . BREAST SURGERY     left 2006  . CESAREAN SECTION    . ORIF ANKLE FRACTURE Left 08/24/2015   Procedure: OPEN REDUCTION INTERNAL FIXATION (ORIF) LEFT ANKLE FRACTURE;  Surgeon: Mcarthur Rossetti, MD;  Location: [facility];  Service: Orthopedics;  Laterality: Left;  General with a popliteal block    FAMILY HISTORY: Family History  Problem Relation Age of Onset  . COPD Father   . Heart disease Father   . Emphysema Father     smoker  . Breast cancer Sister 71    mastectomy; metastasis to liver and lung  . Heart disease Maternal Grandmother   . Congestive Heart Failure Maternal Grandmother   . Heart disease Paternal Grandmother   . Obesity Brother   . Heart attack Mother   . Breast cancer Maternal Aunt     dx. late 60s  . Alzheimer's disease Maternal Aunt   . Breast cancer Maternal Aunt     dx. 35s  . Squamous cell carcinoma Maternal Aunt     recently removed; dx. 95  . Breast cancer Cousin 62    genetic testing in approx 2015  . Ovarian cancer Cousin     dx. 103s  . Esophageal cancer Cousin     not a smoker  . Neuropathy Neg Hx   . Thyroid disease Neg Hx     SOCIAL HISTORY: Social History   Social History  . Marital status: Married    Spouse name: N/A  . Number of children: 2  . Years of education: AS   Occupational History  . 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 Smoking status: Never Smoker  . Smokeless tobacco: Never Used  . Alcohol use 0.0 oz/week     Comment: very rarely - wine  . Drug use: No  . Sexual activity: Yes   Other Topics Concern  . Not on file   Social History  Narrative   Lives at home w/ her husband   Right-handed   Occasionally drinks tea     PHYSICAL EXAM  Vitals:   04/04/16 0858  BP: (!) 146/88  Pulse: 93  Weight: 192 lb (87.1 kg)  Height: 5' 4"  (1.626 m)   Body mass index is 32.96 kg/m.  Generalized: Well developed, in no acute distress  Head: normocephalic and atraumatic,. Oropharynx benign  Neck: Supple, no carotid bruits  Musculoskeletal: No deformity   Neurological examination   Mentation: Alert oriented to time, place, history taking.  Attention span and concentration appropriate. Recent and remote memory intact.  Follows all commands speech and language fluent.   Cranial nerve II-XII: .Pupils were equal round reactive to light extraocular movements were full, visual field were full on confrontational test. Facial sensation and strength were normal. hearing was intact to finger rubbing bilaterally. Uvula tongue midline. head turning and shoulder shrug were normal and symmetric.Tongue protrusion into cheek strength was normal. Motor: normal bulk and tone, full strength in the BUE, BLE, fine finger movements normal, no pronator drift. No focal weakness Sensory: normal and symmetric to light touch, pinprick, and  Vibration in the upper and lower extremities  Coordination: finger-nose-finger, heel-to-shin bilaterally, no dysmetria Reflexes: Brachioradialis 2/2, biceps 2/2, triceps 2/2, patellar 2/2, Achilles 2/2, plantar responses were flexor bilaterally. Gait and Station: Rising up from seated position without assistance, normal stance,  moderate stride, good arm swing, smooth turning, unable to perform tiptoe, and heel walking. Tandem gait is mildly unsteady no assistive device. Mild limp on the left  DIAGNOSTIC DATA (LABS, IMAGING, TESTING) - I reviewed patient records, labs, notes, testing and imaging myself where available.  Lab Results  Component Value Date   WBC 5.0 10/25/2015   HGB 13.4 10/25/2015   HCT 41.5  10/25/2015   MCV 91.6 10/25/2015   PLT 230 10/25/2015      Component Value Date/Time   NA 139 08/22/2015 1020   K 4.3 08/22/2015 1020   CL 105 08/22/2015 1020   CO2 24 08/22/2015 1020   GLUCOSE 122 (H) 08/22/2015 1020   BUN 21 (H) 08/22/2015 1020   CREATININE 1.08 (H) 08/22/2015 1020   CALCIUM 9.6 08/22/2015 1020   PROT 7.3 10/20/2013 0855   ALBUMIN 3.6 09/05/2009 1447   AST 31 09/05/2009 1447   ALT 29 09/05/2009 1447   ALKPHOS 44 09/05/2009 1447   BILITOT 0.5 09/05/2009 1447   GFRNONAA 51 (L) 08/22/2015 1020   GFRAA 59 (L) 08/22/2015 1020    ASSESSMENT AND PLAN  70 y.o. year old female  with a history of restless leg syndrome and paresthesias of the left foot  Continue Mirapex at current dose will refill Continue gabapentin at current dose does not need refills Follow-up in 6-8 months Meghan [name], [facility], [facility], APRN  [facility] [address] [place], [place] [address] [phone]

## 2016-05-13 ENCOUNTER — Ambulatory Visit (INDEPENDENT_AMBULATORY_CARE_PROVIDER_SITE_OTHER): Payer: Medicare Other | Admitting: Internal Medicine

## 2016-05-13 ENCOUNTER — Encounter: Payer: Self-pay | Admitting: Internal Medicine

## 2016-05-13 VITALS — BP 120/70 | HR 84 | Ht 64.0 in | Wt 191.2 lb

## 2016-05-13 DIAGNOSIS — K222 Esophageal obstruction: Secondary | ICD-10-CM | POA: Diagnosis not present

## 2016-05-13 DIAGNOSIS — K219 Gastro-esophageal reflux disease without esophagitis: Secondary | ICD-10-CM | POA: Diagnosis not present

## 2016-05-13 NOTE — Patient Instructions (Signed)
Continue to take your Nexium.

## 2016-05-13 NOTE — Progress Notes (Signed)
HISTORY OF PRESENT ILLNESS:  Meghan [name] is a 71 y.o. 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 Last evaluated April 2017 for acute diverticulitis improved after antibiotic therapy. Patient's current history is that of long-standing reflux disease including a history of peptic stricture requiring esophageal dilation for which she had been doing well remote post dilation on Nexium 40 mg daily. In September she was changed to pantoprazole 40 mg daily. About 4 weeks thereafter she began to notice significant problems with breakthrough reflux symptoms during the day and at night. No recurrent dysphagia. She has gained about 20 pounds over the past year. She also mentions belching. No other GI complaints. She was hoping to resume Nexium therapy as this was effective. She is also tried omeprazole which was not effective. She has discussed with her PCP long-term PPI use and the potential effect on kidney function.  REVIEW OF SYSTEMS:  All non-GI ROS negative except for ankle discomfort  Past Medical History:  Diagnosis Date  . Arthritis   . 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 Colon polyps    Hyperplastic   . Diverticulosis of colon (without mention of hemorrhage)   . Esophageal stricture   . GERD (gastroesophageal reflux disease)   . History of radiation therapy   . History of urinary tract infection   . Neuromuscular disorder ([place])    peripheral nueropathy  . Numbness    feet bilat at nighttime  . Restless leg syndrome 03/27/2014  . Restless legs syndrome (RLS)   . Toe pain, right 09/2014   for 2 wks  . Urinary urgency     Past Surgical History:  Procedure Laterality Date  . BREAST SURGERY     left 2006  . CESAREAN SECTION    . ORIF ANKLE FRACTURE Left 08/24/2015   Procedure: OPEN REDUCTION INTERNAL FIXATION (ORIF) LEFT ANKLE FRACTURE;   Surgeon: Mcarthur Rossetti, MD;  Location: [facility];  Service: Orthopedics;  Laterality: Left;  General with a popliteal block    Social History Meghan [name]  reports that she has never smoked. She has never used smokeless tobacco. She reports that she drinks alcohol. She reports that she does not use drugs.  family history includes Alzheimer's disease in her maternal aunt; Breast cancer in her maternal aunt and maternal aunt; Breast cancer (age of onset: 47) in her sister; Breast cancer (age of onset: 81) in her cousin; COPD in her father; Congestive Heart Failure in her maternal grandmother; Emphysema in her father; Esophageal cancer in her cousin; Heart attack in her mother; Heart disease in her father, maternal grandmother, and paternal grandmother; Obesity in her brother; Ovarian cancer in her cousin; Squamous cell carcinoma in her maternal aunt.  Allergies  Allergen Reactions  . Gabapentin Other (See Comments)  . Nsaids Other (See Comments)    CKD - per PCP  . Tetracycline Hcl Other (See Comments)       PHYSICAL EXAMINATION: Vital signs: BP 120/70   Pulse 84   Ht _0  (1.626 m)   Wt 191 lb 4 oz (86.8 kg)   BMI 32.83 kg/m   Constitutional: generally well-appearing, no acute distress Psychiatric: alert and oriented x3, cooperative Eyes: extraocular movements intact, anicteric, conjunctiva pink Mouth: oral pharynx moist, no lesions Neck: supple no lymphadenopathy Cardiovascular: heart regular rate and rhythm, no murmur Lungs: clear to auscultation  bilaterally Abdomen: soft, nontender, nondistended, no obvious ascites, no peritoneal signs, normal bowel sounds, no organomegaly Rectal: Omitted Extremities: no clubbing cyanosis or lower extremity edema bilaterally Skin: no lesions on visible extremities Neuro: No focal deficits. No asterixis.   ASSESSMENT:  #1. GERD complicated by peptic stricture (dilation March 2007). Now with recurrent reflux symptoms after changing  from Nexium to pantoprazole #2. Colon cancer screening. Up-to-date. Last exam December 2011 with hyperplastic polyp only #3. History of IBS #4. Overweight  PLAN:  #1. Reflux precautions with attention to weight loss #2. Change from Protonix to Nexium 40 mg daily. Antacids for breakthrough symptoms. #3. Discussed with patient current knowledge base on chronic PPI use and potential side effects including effects on the kidney. Given the fact that she has had documented complicated reflux disease, think the risk-benefit profile this point favors ongoing PPI use. The patient agrees knowing that she could not come off PPI and tolerate symptom complex or risk recurrent significant dysphagia as she has had the past #4. Surveillance colonoscopy 2021 #5. Routine GI follow-up one year. Sooner if needed

## 2016-06-07 ENCOUNTER — Encounter: Payer: Self-pay | Admitting: Internal Medicine

## 2016-09-18 ENCOUNTER — Other Ambulatory Visit: Payer: Self-pay | Admitting: *Deleted

## 2016-09-18 DIAGNOSIS — R918 Other nonspecific abnormal finding of lung field: Secondary | ICD-10-CM

## 2016-09-29 ENCOUNTER — Encounter: Payer: Self-pay | Admitting: Neurology

## 2016-09-30 ENCOUNTER — Other Ambulatory Visit: Payer: Self-pay | Admitting: *Deleted

## 2016-09-30 MED ORDER — GABAPENTIN 300 MG PO CAPS: 600.0000 mg | ORAL_CAPSULE | Freq: Every day | ORAL | 0 refills | Status: DC

## 2016-10-09 ENCOUNTER — Other Ambulatory Visit: Payer: Self-pay | Admitting: Obstetrics & Gynecology

## 2016-10-09 DIAGNOSIS — Z1231 Encounter for screening mammogram for malignant neoplasm of breast: Secondary | ICD-10-CM

## 2016-10-10 DIAGNOSIS — E041 Nontoxic single thyroid nodule: Secondary | ICD-10-CM | POA: Diagnosis not present

## 2016-10-10 DIAGNOSIS — Z8719 Personal history of other diseases of the digestive system: Secondary | ICD-10-CM | POA: Diagnosis not present

## 2016-10-10 DIAGNOSIS — R911 Solitary pulmonary nodule: Secondary | ICD-10-CM | POA: Diagnosis not present

## 2016-10-10 DIAGNOSIS — M859 Disorder of bone density and structure, unspecified: Secondary | ICD-10-CM | POA: Diagnosis not present

## 2016-10-10 DIAGNOSIS — G629 Polyneuropathy, unspecified: Secondary | ICD-10-CM | POA: Diagnosis not present

## 2016-10-10 DIAGNOSIS — Z853 Personal history of malignant neoplasm of breast: Secondary | ICD-10-CM | POA: Diagnosis not present

## 2016-10-10 DIAGNOSIS — Z0000 Encounter for general adult medical examination without abnormal findings: Secondary | ICD-10-CM | POA: Diagnosis not present

## 2016-10-10 DIAGNOSIS — N189 Chronic kidney disease, unspecified: Secondary | ICD-10-CM | POA: Diagnosis not present

## 2016-10-30 ENCOUNTER — Ambulatory Visit
Admission: RE | Admit: 2016-10-30 | Discharge: 2016-10-30 | Disposition: A | Discharge status: Home / Self Care | Payer: Medicare Other | Source: Ambulatory Visit | Attending: Cardiothoracic Surgery | Admitting: Cardiothoracic Surgery

## 2016-10-30 ENCOUNTER — Encounter: Payer: Medicare Other | Admitting: Cardiothoracic Surgery

## 2016-10-30 DIAGNOSIS — R918 Other nonspecific abnormal finding of lung field: Secondary | ICD-10-CM | POA: Diagnosis not present

## 2016-10-31 ENCOUNTER — Encounter: Payer: Self-pay | Admitting: Cardiothoracic Surgery

## 2016-10-31 ENCOUNTER — Ambulatory Visit (INDEPENDENT_AMBULATORY_CARE_PROVIDER_SITE_OTHER): Payer: Medicare Other | Admitting: Cardiothoracic Surgery

## 2016-10-31 VITALS — BP 160/88 | HR 92 | Resp 20 | Ht 64.0 in | Wt 175.0 lb

## 2016-10-31 DIAGNOSIS — R911 Solitary pulmonary nodule: Secondary | ICD-10-CM | POA: Diagnosis not present

## 2016-10-31 NOTE — Progress Notes (Signed)
[address]       ,[place] [address]             [phone]                    Meghan [name] [place] Medical Record #[id number] Date of Birth: Jul 02, 1945  Referring: Rigoberto Noel, MD Primary Care: Hulan Fess, MD  Chief Complaint:    Chief Complaint  Patient presents with  . Lung Lesion    7 month f/u with Chest CT    History of Present Illness:     Patient returns to a with a follow-up CT scan,   Meghan [name] 71 y.o. female has been followed in the office for Abnormal CT PET scan. Patient originally had injury in April 2017 when she fell fracturing her right ankle and landing on her right lower chest. Following this she noted some pleuritic chest pain on the right. Because of this she requested a chest x-ray to be done at her preop visit for the repair of her left ankle fracture. The chest x-ray suggested an anterior mass Right chest. A CT scan of the chest was performed to follow-up on this and showed a pleural-based approximate 2 cm in size, CT scan did not suggest any other evidence of lung malignancy. 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 Needle biopsy of right lung lesion done, inflammatory mass. A follow-up CT scan in September 2017  showed Right lower lobe subpleural nodular opacity has decreased in size since 10/05/2015, most consistent with a resolving inflammatory process.  A  New 1.0 cm left lower lobe pulmonary nodule was noted . The appearance of this nodule in the relatively short interval since the 10/18/2015 PET-CT is suggestive of an infectious or inflammatory process, however neoplasm could not be  excluded.   She denies any cough or respiratory symptoms since last seen.  She has been a lifelong nonsmoker, in the past both of her parents smoked and her husband smoked in the past.  Current Activity/ Functional Status:  Patient is independent with  mobility/ambulation, transfers, ADL's, IADL's.    Zubrod Score: At the time of surgery this patient's most appropriate activity status/level should be described as: []     0    Normal activity, no symptoms [x]     1    Restricted in physical strenuous activity but ambulatory, able to do out light work []     2    Ambulatory and capable of self care, unable to do work activities, up and about               >50 % of waking hours                              []     3    Only limited self care, in bed greater than 50% of waking hours []     4    Completely disabled, no self care, confined to bed or chair []     5    Moribund   Past Medical History:  Diagnosis Date  . Arthritis   . 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 Colon polyps    Hyperplastic   . Diverticulosis of colon (without mention of hemorrhage)   . Esophageal stricture   .  GERD (gastroesophageal reflux disease)   . History of radiation therapy   . History of urinary tract infection   . Neuromuscular disorder ([place])    peripheral nueropathy  . Numbness    feet bilat at nighttime  . Restless leg syndrome 03/27/2014  . Restless legs syndrome (RLS)   . Toe pain, right 09/2014   for 2 wks  . Urinary urgency     Past Surgical History:  Procedure Laterality Date  . BREAST SURGERY     left 2006  . CESAREAN SECTION    . ORIF ANKLE FRACTURE Left 08/24/2015   Procedure: OPEN REDUCTION INTERNAL FIXATION (ORIF) LEFT ANKLE FRACTURE;  Surgeon: Mcarthur Rossetti, MD;  Location: [facility];  Service: Orthopedics;  Laterality: Left;  General with a popliteal block    Family History  Problem Relation Age of Onset  . COPD Father   . Heart disease Father   . Emphysema Father        smoker  . Breast cancer Sister 81       mastectomy; metastasis to liver and lung  . Heart disease Maternal Grandmother   . Congestive Heart Failure Maternal Grandmother   . Heart disease Paternal Grandmother   .  Obesity Brother   . Heart attack Mother   . Breast cancer Maternal Aunt        dx. late 28s  . Alzheimer's disease Maternal Aunt   . Breast cancer Maternal Aunt        dx. 33s  . Squamous cell carcinoma Maternal Aunt        recently removed; dx. 95  . Breast cancer Cousin 62       genetic testing in approx 2015  . Ovarian cancer Cousin        dx. 50s  . Esophageal cancer Cousin        not a smoker  . Neuropathy Neg Hx   . Thyroid disease Neg Hx     Social History   Social History  . Marital status: Married    Spouse name: N/A  . Number of children: 2  . Years of education: AS   Occupational History  . 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 Smoking status: Never Smoker  . Smokeless tobacco: Never Used  . Alcohol use 0.0 oz/week     Comment: very rarely - wine  . Drug use: No  . Sexual activity: Yes   Other Topics Concern  . Not on file   Social History Narrative   Lives at home w/ her husband   Right-handed   Occasionally drinks tea    History  Smoking Status  . Never Smoker  Smokeless Tobacco  . Never Used    History  Alcohol Use  . 0.0 oz/week    Comment: very rarely - wine     Allergies  Allergen Reactions  . Gabapentin Other (See Comments)  . Nsaids Other (See Comments)    CKD - per PCP  . Tetracycline Hcl Other (See Comments)    Current Outpatient Prescriptions  Medication Sig Dispense Refill  . Calcium Citrate (CITRACAL PO) Take 1,200 mg by mouth daily.     . Cholecalciferol (VITAMIN D3) 2000 units TABS Take 4,000 Units by mouth daily.     . Cranberry-Vitamin C-Probiotic (AZO CRANBERRY) 250-30 MG TABS Take 1 tablet by mouth 2 (two) times daily.     Marland Kitchen [place]  Take 2 tablets by mouth at bedtime.     . fluticasone (FLONASE) 50 MCG/ACT nasal spray Place 1 spray into both nostrils daily as needed for allergies or rhinitis.     Marland Kitchen gabapentin (NEURONTIN) 300 MG capsule Take 2 capsules  (600 mg total) by mouth at bedtime. 180 capsule 0  . Misc Natural Products (OSTEO BI-FLEX ADV TRIPLE ST) TABS Take 1 tablet by mouth 2 (two) times daily.    . Multiple Vitamin (MULTIVITAMIN) tablet Take 1 tablet by mouth every evening.     . Omega-3 Fatty Acids (FISH OIL) 1200 MG CAPS Take 1,200 mg by mouth daily.     . pantoprazole (PROTONIX) 40 MG tablet Take 40 mg by mouth daily.    . pramipexole (MIRAPEX) 0.5 MG tablet Take 1 tablet (0.5 mg total) by mouth at bedtime. 90 tablet 2  . clidinium-chlordiazePOXIDE (LIBRAX) 5-2.5 MG per capsule Take 1 capsule by mouth as needed. (Patient taking differently: Take 1 capsule by mouth every 6 (six) hours as needed (for divertulitis flares). ) 100 capsule 3   No current facility-administered medications for this visit.       Review of Systems:     Cardiac Review of Systems: Y or N  Chest Pain Aqua.Slicker   ]  Resting SOB [ N] Exertional SOB  N ]  Orthopnea [N]   Pedal Edema Aqua.Slicker  ]    Palpitations Aqua.Slicker ] Syncope  Aqua.Slicker ]   Presyncope Aqua.Slicker ]  General Review of Systems: [Y] = yes [  ]=no Constitional: recent weight change [  N];  Wt loss over the last 3 months [   ] anorexia [  ]; fatigue [  ]; nausea [  ]; night sweats [  ]; fever [  ]; or chills [  ];          Dental: poor dentition[  ]; Last Dentist visit:   Eye : blurred vision [  ]; diplopia [   ]; vision changes [  ];  Amaurosis fugax[  ]; Resp: cough [  ];  wheezing[ N ];  hemoptysis[ N ]; shortness of breath[  ]; paroxysmal nocturnal dyspnea[ N ]; dyspnea on exertion[  ]; or orthopnea[N  ];  GI:  gallstones[  ], vomiting[  ];  dysphagia[  ]; melena[  ];  hematochezia [  ]; heartburn[  ];   Hx of  Colonoscopy[  ]; GU: kidney stones [  ]; hematuria[  ];   dysuria [  ];  nocturia[  ];  history of     obstruction [  ]; urinary frequency [  ]             Skin: rash, swelling[  ];, hair loss[  ];  peripheral edema[  ];  or itching[  ]; Musculosketetal: myalgias[  ];  joint swelling[  ];  joint erythema[  ];  joint  pain[  ];  back pain[  ];  Heme/Lymph: bruising[  ];  bleeding[  ];  anemia[  ];  Neuro: TIA[ N ];  headaches[N ];  stroke[  ];  vertigo[  ];  seizures[  ];   paresthesias[  ];  difficulty walking[ y ];  Psych:depression[  ]; anxiety[  ];  Endocrine: diabetes[N  ];  thyroid dysfunction[N  ];  Immunizations: Flu up to date [ y ]; Pneumococcal up to date [ y ];  Other:  Physical Exam: BP (!) 160/88   Pulse 92   Resp 20  Ht 5' 4"  (1.626 m)   Wt 175 lb (79.4 kg)   SpO2 97% Comment: RA  BMI 30.04 kg/m   PHYSICAL EXAMINATION: General appearance: ALERT, ALERT AND COOPERATIVE Head: Normocephalic, without obvious abnormality, atraumatic Neck: Patient has no cervical supraclavicular adenopathy no carotid bruit no jugular distention trachea is in the midline Lymph nodes: Cervical supra [place] axillary lymph nodes are normal  Resp: Lungs are clear bilaterally Cardio: Cardiac exam feels regular rate and rhythm without murmur gallop  GI: Abdomen soft without masses  Extremities: Extremities are atraumatic negative for edema or DVT  Neurologic: Without lateralizing signs    Diagnostic Studies & Laboratory data:     Recent Radiology Findings:  Ct Chest Wo Contrast  Result Date: 10/30/2016 CLINICAL DATA:  Followup pulmonary nodules. EXAM: CT CHEST WITHOUT CONTRAST TECHNIQUE: Multidetector CT imaging of the chest was performed following the standard protocol without IV contrast. COMPARISON:  CT scans 04/03/2016 and 10/05/2015.  PET-CT 10/18/2015 FINDINGS: Cardiovascular: The heart is normal in size and stable. No pericardial effusion. Stable tortuosity and minimal scattered aortic calcifications. Stable scattered coronary artery calcifications. Mediastinum/Nodes: Small scattered mediastinal and hilar lymph nodes are stable. No mass or adenopathy. The esophagus is grossly normal in stable. Lungs/Pleura: The left lower lobe pulmonary nodular density has resolved. There is a persistent triangular  subpleural density at the right lung base measuring approximately 11 x 7 mm. This is unchanged and likely chronic scarring. No new pulmonary lesions. A few scattered tiny subpleural nodules are likely lymph nodes. No acute pulmonary findings. No pleural effusion. Upper Abdomen: No significant upper abdominal findings. Chest wall/ Musculoskeletal: No breast masses, supraclavicular or axillary lymphadenopathy. Small tracheal diverticulum noted. Stable small right thyroid nodule. No acute Bony findings. IMPRESSION: 1. Resolution of left lower lobe pulmonary nodule. 2. Persistent triangular shaped right basilar density, likely scar. 3. No new pulmonary lesions or acute pulmonary findings. 4. Stable small mediastinal and hilar lymph nodes but no mass or adenopathy. Aortic Atherosclerosis (ICD10-I70.0). Electronically Signed   By: Marijo Sanes M.D.   On: 10/30/2016 11:05   Ct Super D Chest Wo Contrast  Result Date: 04/03/2016 CLINICAL DATA:  Pulmonary nodules. Personal history of left breast carcinoma and previous radiation therapy. EXAM: CT CHEST WITHOUT CONTRAST TECHNIQUE: Multidetector CT imaging of the chest was performed using thin slice collimation for electromagnetic bronchoscopy planning purposes, without intravenous contrast. COMPARISON:  01/24/2016 and 10/05/2015 FINDINGS: Cardiovascular:  No acute findings. Mediastinum/Nodes: No pathologically enlarged lymph nodes identified on this unenhanced exam. Bilateral thyroid nodules remain stable, largest in the right lobe measuring 1.8 cm. Lungs/Pleura: Previously seen posterior left lower lobe pulmonary nodule has nearly completely resolved since previous study, consistent with resolving inflammatory or infectious process. Subpleural nodular opacity in posterior right costophrenic sulcus measures 0.6 x 1.5 cm on image 88/4 and is stable since most recent study on 01/24/2016. This showed decrease in size compared to earlier exam on 10/05/2015, also consistent  with benign etiology. No evidence of pleural effusion. Upper Abdomen:  Unremarkable. Musculoskeletal: No suspicious bone lesions or other significant abnormality. IMPRESSION: Near complete resolution of posterior left lower lobe pulmonary nodule since 01/24/2016, consistent with resolving inflammatory or infectious process. Subpleural nodular opacity in posterior right costophrenic sulcus is unchanged since 01/24/2016 but shows decreased size compared to earlier CT on 10/05/2015, also consistent with benign etiology. No acute findings. No evidence of lymphadenopathy or pleural effusion. Electronically Signed   By: Earle Gell M.D.   On: 04/03/2016 09:56  Ct Chest Wo Contrast  Result Date: 01/24/2016 CLINICAL DATA:  History of left breast cancer. Follow-up right lower lobe pulmonary nodule. Percutaneous biopsy of this nodule on 10/25/2015 demonstrated benign reactive fibrosis with no evidence of malignancy. EXAM: CT CHEST WITHOUT CONTRAST TECHNIQUE: Multidetector CT imaging of the chest was performed following the standard protocol without IV contrast. COMPARISON:  10/18/2015 PET-CT and 10/05/2015 chest CT. FINDINGS: Motion degraded scan. Cardiovascular: Normal heart size. No significant pericardial fluid/thickening. Left main, left anterior descending and right coronary atherosclerosis. Atherosclerotic nonaneurysmal thoracic aorta. Normal caliber pulmonary arteries. Mediastinum/Nodes: Stable hypodense bilateral thyroid nodules, largest 1.9 cm on the right. Unremarkable esophagus. No pathologically enlarged axillary, mediastinal or gross hilar lymph nodes, noting limited sensitivity for the detection of hilar adenopathy on this noncontrast study. Lungs/Pleura: No pneumothorax. No pleural effusion. Subpleural basilar right lower lobe 1.5 x 0.6 cm nodular opacity (series 4/ image 87), decreased from 2.3 x 1.0 cm on 10/05/2015 using similar measurement technique. Previously described 3 mm left lower lobe pulmonary  nodule on the 10/05/2015 chest CT study is not appreciated on today's motion degraded scan. There is a new 1.0 x 1.0 cm left lower lobe pulmonary nodule (series 4/ image 70). No acute consolidative airspace disease or additional significant pulmonary nodules. Upper abdomen: Colonic diverticulosis. Musculoskeletal: No aggressive appearing focal osseous lesions. Mild thoracic spondylosis. IMPRESSION: 1. Right lower lobe subpleural nodular opacity has decreased in size since 10/05/2015, most consistent with a resolving inflammatory process. 2. New 1.0 cm left lower lobe pulmonary nodule. The appearance of this nodule in the relatively short interval since the 10/18/2015 PET-CT is suggestive of an infectious or inflammatory process, however neoplasm is not entirely excluded. A follow-up chest CT in 3 months is advised. Alternatively, PET-CT or tissue sampling could be obtained as clinically warranted. 3. No thoracic adenopathy or other findings of metastatic disease in the chest. 4. Additional findings include aortic atherosclerosis, left main and two-vessel coronary atherosclerosis and colonic diverticulosis. Electronically Signed   By: Ilona Sorrel M.D.   On: 01/24/2016 10:37     I have independently reviewed the above radiology studies  and reviewed the findings with the patient.   Recent Lab Findings: Lab Results  Component Value Date   WBC 5.0 10/25/2015   HGB 13.4 10/25/2015   HCT 41.5 10/25/2015   PLT 230 10/25/2015   GLUCOSE 122 (H) 08/22/2015   ALT 29 09/05/2009   AST 31 09/05/2009   NA 139 08/22/2015   K 4.3 08/22/2015   CL 105 08/22/2015   CREATININE 1.08 (H) 08/22/2015   BUN 21 (H) 08/22/2015   CO2 24 08/22/2015   INR 1.03 10/25/2015      Assessment / Plan:    1/complete resolution of posterior left lower lobe pulmonary nodule since 09/21/201,  consistent with  inflammatory or infectious process. 2/Pleural-based area in the right chest appears smaller, this lesion had previously  been biopsied and was inflammatory.  Now with documents ability and resolution of the left lung lesion we will not get further CT scans unless clinical situation changes I plan to see the patient back as needed  Grace Isaac MD      [address] ,[place] [address] Office [phone]   Beeper [phone]  10/31/2016 1:47 PM

## 2016-11-05 ENCOUNTER — Encounter: Payer: Self-pay | Admitting: Internal Medicine

## 2016-11-18 ENCOUNTER — Telehealth: Payer: Self-pay | Admitting: Internal Medicine

## 2016-11-18 MED ORDER — CILIDINIUM-CHLORDIAZEPOXIDE 2.5-5 MG PO CAPS: 1.0000 | ORAL_CAPSULE | ORAL | 3 refills | Status: DC | PRN

## 2016-11-18 NOTE — Telephone Encounter (Signed)
Yes, please refill today

## 2016-11-18 NOTE — Telephone Encounter (Signed)
Is it ok to refill and fax Librax to out of state pharmacy Dr. Henrene Pastor?

## 2016-11-18 NOTE — Telephone Encounter (Signed)
Faxed rx for Librax to [facility] in [place]. Patient informed and verbalized understanding.

## 2016-11-19 ENCOUNTER — Ambulatory Visit: Payer: Medicare Other | Admitting: Nurse Practitioner

## 2016-11-21 ENCOUNTER — Ambulatory Visit: Payer: Medicare Other

## 2016-11-25 ENCOUNTER — Ambulatory Visit
Admission: RE | Admit: 2016-11-25 | Discharge: 2016-11-25 | Disposition: A | Discharge status: Home / Self Care | Payer: Medicare Other | Source: Ambulatory Visit | Attending: Obstetrics & Gynecology | Admitting: Obstetrics & Gynecology

## 2016-11-25 DIAGNOSIS — Z1231 Encounter for screening mammogram for malignant neoplasm of breast: Secondary | ICD-10-CM | POA: Diagnosis not present

## 2016-11-25 HISTORY — DX: Personal history of irradiation: Z92.3

## 2016-11-28 ENCOUNTER — Ambulatory Visit: Payer: Medicare Other | Admitting: Nurse Practitioner

## 2016-12-01 DIAGNOSIS — Z6830 Body mass index (BMI) 30.0-30.9, adult: Secondary | ICD-10-CM | POA: Diagnosis not present

## 2016-12-01 DIAGNOSIS — Z01419 Encounter for gynecological examination (general) (routine) without abnormal findings: Secondary | ICD-10-CM | POA: Diagnosis not present

## 2016-12-01 NOTE — Progress Notes (Signed)
[facility]  PATIENT: Meghan [name] DOB: August 22, 1945   REASON FOR VISIT: Follow-up for restless legs  HISTORY FROM: Patient    HISTORY OF PRESENT ILLNESS:UPDATE 07/31/2018CM Meghan [name], 71 year old female returns for follow-up for history of restless leg syndrome. She is currently on gabapentin and Mirapex with good control of her symptoms. She denies any compulsive behaviors on Mirapex. She continues to be fairly active however her left ankle still bothers her at times from when she fractured her ankle back in August 2016. She occasionally has swelling in the left foot and some numbness at times. She is able to sleep during the night with her medications and rarely has to get up due to her restless legs. She returns for reevaluation   UPDATE 12/01/2017CM Meghan [name] 71 year old female returns for follow-up. She has a history of restless leg syndrome and is currently on Mirapex with good relief. She finished her therapy for her fractured ankle on the left leg back in August that she continues to have some swelling and a mild limp when walking. She is not nearly as active and has gained some weight. She claims she tries to walk for exercise but after about 10 minutes her left foot begins to bother her and she stops. She is going to to the gym to work on upper body strength and does ride the bicycle. She continues to complain of some numbness in the left foot. She returns for reevaluation   10/04/15 Meghan [name] is a 71 year old right-handed white female with a history of restless leg syndrome. The symptoms are primarily at nighttime. She has been seen initially for paresthesias on all fours, but this issue has completely resolved. The patient has been on gabapentin for the restless legs, this has not been completely effective. She takes 600 mg at night but still has restless leg symptoms. She fell 6 weeks ago and has had compound fractures of the left lower leg requiring  surgery. The patient is recovering from this, she has not yet weightbearing on the left leg. The patient returns this office for an evaluation. She is having some pain around the surgical site.  REVIEW OF SYSTEMS: Full 14 system review of systems performed and notable only for those listed, all others are neg:  Constitutional: neg  Cardiovascular: neg Ear/Nose/Throat: neg  Skin: neg Eyes: neg Respiratory: neg Gastroitestinal: neg  Hematology/Lymphatic: neg  Endocrine: neg Musculoskeletal:neg Allergy/Immunology: neg Neurological: neg Psychiatric: neg Sleep : Restless leg syndrome   ALLERGIES: Allergies  Allergen Reactions  . Flagyl [Metronidazole]   . Gabapentin Other (See Comments)  . Nsaids Other (See Comments)    CKD - per PCP  . Tetracycline Hcl Other (See Comments)    HOME MEDICATIONS: Outpatient Medications Prior to Visit  Medication Sig Dispense Refill  . Calcium Citrate (CITRACAL PO) Take 600 mg by mouth daily.     . Cholecalciferol (VITAMIN D3) 2000 units TABS Take 4,000 Units by mouth daily.     . clidinium-chlordiazePOXIDE (LIBRAX) 5-2.5 MG capsule Take 1 capsule by mouth as needed. 100 capsule 3  . Cranberry-Vitamin C-Probiotic (AZO CRANBERRY) 250-30 MG TABS Take 1 tablet by mouth 2 (two) times daily.     Marland Kitchen FIBER SELECT GUMMIES PO Take 2 tablets by mouth at bedtime.     . fluticasone (FLONASE) 50 MCG/ACT nasal spray Place 1 spray into both nostrils daily as needed for allergies or rhinitis.     Marland Kitchen gabapentin (NEURONTIN) 300 MG capsule Take 2 capsules (600 mg  total) by mouth at bedtime. 180 capsule 0  . Misc Natural Products (OSTEO BI-FLEX ADV TRIPLE ST) TABS Take 1 tablet by mouth 2 (two) times daily.    . Multiple Vitamin (MULTIVITAMIN) tablet Take 1 tablet by mouth every evening.     . Omega-3 Fatty Acids (FISH OIL) 1200 MG CAPS Take 1,200 mg by mouth daily.     . pantoprazole (PROTONIX) 40 MG tablet Take 40 mg by mouth daily.    . pramipexole (MIRAPEX) 0.5 MG  tablet Take 1 tablet (0.5 mg total) by mouth at bedtime. 90 tablet 2   No facility-administered medications prior to visit.     PAST MEDICAL HISTORY: Past Medical History:  Diagnosis Date  . Arthritis   . 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 Colon polyps    Hyperplastic   . Diverticulosis of colon (without mention of hemorrhage)   . Esophageal stricture   . GERD (gastroesophageal reflux disease)   . History of radiation therapy   . History of urinary tract infection   . Neuromuscular disorder ([place])    peripheral nueropathy  . Numbness    feet bilat at nighttime  . Personal history of radiation therapy 2006  . Restless leg syndrome 03/27/2014  . Restless legs syndrome (RLS)   . Toe pain, right 09/2014   for 2 wks  . Urinary urgency     PAST SURGICAL HISTORY: Past Surgical History:  Procedure Laterality Date  . BREAST LUMPECTOMY Left 2006  . BREAST SURGERY     left 2006  . CESAREAN SECTION    . ORIF ANKLE FRACTURE Left 08/24/2015   Procedure: OPEN REDUCTION INTERNAL FIXATION (ORIF) LEFT ANKLE FRACTURE;  Surgeon: Mcarthur Rossetti, MD;  Location: [facility];  Service: Orthopedics;  Laterality: Left;  General with a popliteal block    FAMILY HISTORY: Family History  Problem Relation Age of Onset  . COPD Father   . Heart disease Father   . Emphysema Father        smoker  . Breast cancer Sister 13       mastectomy; metastasis to liver and lung  . Heart disease Maternal Grandmother   . Congestive Heart Failure Maternal Grandmother   . Heart disease Paternal Grandmother   . Obesity Brother   . Heart attack Mother   . Breast cancer Maternal Aunt        dx. late 32s  . Alzheimer's disease Maternal Aunt   . Breast cancer Maternal Aunt        dx. 93s  . Squamous cell carcinoma Maternal Aunt        recently removed; dx. 95  . Breast cancer Cousin 62       genetic testing in approx 2015  . Ovarian cancer Cousin        dx. 43s   . Esophageal cancer Cousin        not a smoker  . Neuropathy Neg Hx   . Thyroid disease Neg Hx     SOCIAL HISTORY: Social History   Social History  . Marital status: Married    Spouse name: N/A  . Number of children: 2  . Years of education: AS   Occupational History  . 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 Smoking status: Never Smoker  . Smokeless tobacco: Never Used  . Alcohol use 0.0 oz/week  Comment: very rarely - wine  . Drug use: No  . Sexual activity: Yes   Other Topics Concern  . Not on file   Social History Narrative   Lives at home w/ her husband   Right-handed   Occasionally drinks tea     PHYSICAL EXAM  Vitals:   12/02/16 0751  BP: (!) 148/88  Pulse: 81  Weight: 174 lb (78.9 kg)  Height: 5' 4"  (1.626 m)   Body mass index is 29.87 kg/m.  Generalized: Well developed, Obese female in no acute distress  Head: normocephalic and atraumatic,. Oropharynx benign  Neck: Supple,  Musculoskeletal: No deformity   Neurological examination   Mentation: Alert oriented to time, place, history taking. Attention span and concentration appropriate. Recent and remote memory intact.  Follows all commands speech and language fluent.   Cranial nerve II-XII: .Pupils were equal round reactive to light extraocular movements were full, visual field were full on confrontational test. Facial sensation and strength were normal. hearing was intact to finger rubbing bilaterally. Uvula tongue midline. head turning and shoulder shrug were normal and symmetric.Tongue protrusion into cheek strength was normal. Motor: normal bulk and tone, full strength in the BUE, BLE, fine finger movements normal,  No focal weakness Sensory: normal and symmetric to light touch, pinprick, and  Vibration in the upper and lower extremities  Coordination: finger-nose-finger, heel-to-shin bilaterally, no dysmetria Reflexes: Symmetric upper and lower  ,plantar responses were flexor bilaterally. Gait and Station: Rising up from seated position without assistance, normal stance,  moderate stride, good arm swing, smooth turning, unable to perform tiptoe, and heel walking. Tandem gait is mildly unsteady no assistive device. Mild limp on the left  DIAGNOSTIC DATA (LABS, IMAGING, TESTING) - I reviewed patient records, labs, notes, testing and imaging myself where available.  Lab Results  Component Value Date   WBC 5.0 10/25/2015   HGB 13.4 10/25/2015   HCT 41.5 10/25/2015   MCV 91.6 10/25/2015   PLT 230 10/25/2015      Component Value Date/Time   NA 139 08/22/2015 1020   K 4.3 08/22/2015 1020   CL 105 08/22/2015 1020   CO2 24 08/22/2015 1020   GLUCOSE 122 (H) 08/22/2015 1020   BUN 21 (H) 08/22/2015 1020   CREATININE 1.08 (H) 08/22/2015 1020   CALCIUM 9.6 08/22/2015 1020   PROT 7.3 10/20/2013 0855   ALBUMIN 3.6 09/05/2009 1447   AST 31 09/05/2009 1447   ALT 29 09/05/2009 1447   ALKPHOS 44 09/05/2009 1447   BILITOT 0.5 09/05/2009 1447   GFRNONAA 51 (L) 08/22/2015 1020   GFRAA 59 (L) 08/22/2015 1020    ASSESSMENT AND PLAN  71 y.o. year old female  with a history of restless leg syndrome and paresthesias of the left foot  Continue Mirapex at current dose will refill Continue gabapentin at current dose will  refills Follow-up in 1 year and prn  Dennie Bible, [facility], [facility], APRN  [facility] [address], [place], [place] [address] [phone]

## 2016-12-02 ENCOUNTER — Encounter: Payer: Self-pay | Admitting: Nurse Practitioner

## 2016-12-02 ENCOUNTER — Ambulatory Visit (INDEPENDENT_AMBULATORY_CARE_PROVIDER_SITE_OTHER): Payer: Medicare Other | Admitting: Nurse Practitioner

## 2016-12-02 VITALS — BP 148/88 | HR 81 | Ht 64.0 in | Wt 174.0 lb

## 2016-12-02 DIAGNOSIS — R209 Unspecified disturbances of skin sensation: Secondary | ICD-10-CM

## 2016-12-02 DIAGNOSIS — G2581 Restless legs syndrome: Secondary | ICD-10-CM

## 2016-12-02 MED ORDER — PRAMIPEXOLE DIHYDROCHLORIDE 0.5 MG PO TABS: 0.5000 mg | ORAL_TABLET | Freq: Every day | ORAL | 3 refills | Status: DC

## 2016-12-02 MED ORDER — GABAPENTIN 300 MG PO CAPS: 600.0000 mg | ORAL_CAPSULE | Freq: Every day | ORAL | 3 refills | Status: DC

## 2016-12-02 NOTE — Progress Notes (Signed)
I have read the note, and I agree with the clinical assessment and plan.  Meghan [name]   

## 2016-12-02 NOTE — Patient Instructions (Addendum)
Continue Mirapex at current dose will refill Continue gabapentin at current dose will  refills Follow-up in 1 year

## 2016-12-22 ENCOUNTER — Other Ambulatory Visit: Payer: Self-pay | Admitting: Internal Medicine

## 2017-01-28 DIAGNOSIS — Z23 Encounter for immunization: Secondary | ICD-10-CM | POA: Diagnosis not present

## 2017-03-22 ENCOUNTER — Other Ambulatory Visit: Payer: Self-pay | Admitting: Internal Medicine

## 2017-03-30 DIAGNOSIS — H52203 Unspecified astigmatism, bilateral: Secondary | ICD-10-CM | POA: Diagnosis not present

## 2017-03-30 DIAGNOSIS — H2513 Age-related nuclear cataract, bilateral: Secondary | ICD-10-CM | POA: Diagnosis not present

## 2017-04-08 DIAGNOSIS — R05 Cough: Secondary | ICD-10-CM | POA: Diagnosis not present

## 2017-06-01 IMAGING — CT CT BIOPSY
1 of 2 series · 13 of 32 positions shown, 19 images · non-contrast
Comparison: none

INDICATION: Right lower lobe lung nodule

[Series 2: i-spiral 5.0 b40f · axial · 0.77mm/px · z∈[+1191,+1282]mm · 13 of 30 slices shown, 19 images]
[im 2/30  soft-tissue]
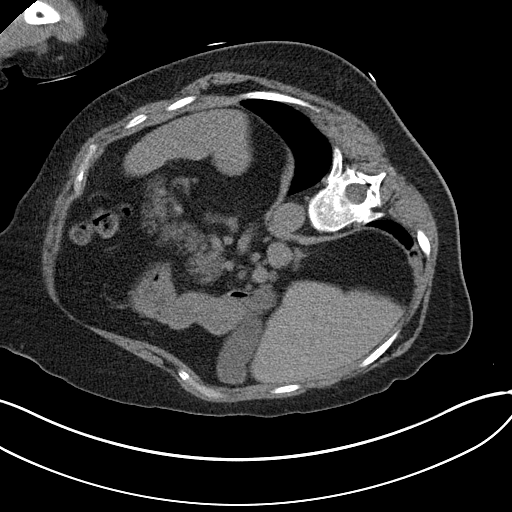
[im 2/30  bone]
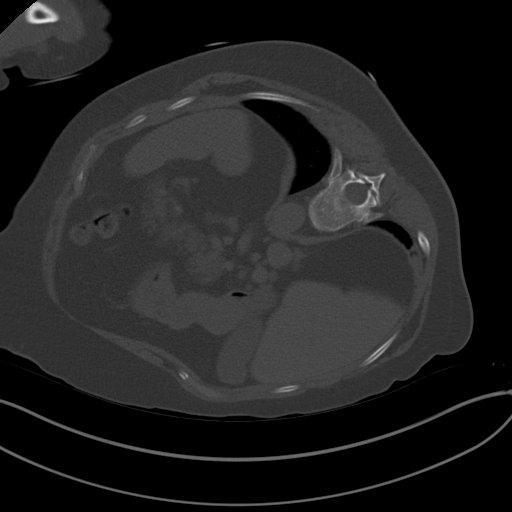
[im 4/30  soft-tissue]
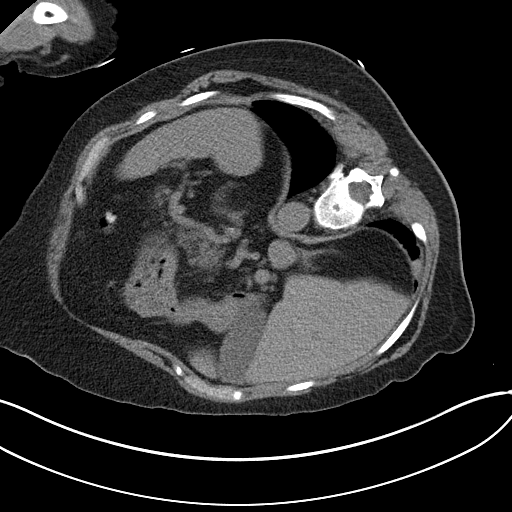
[im 6/30  soft-tissue]
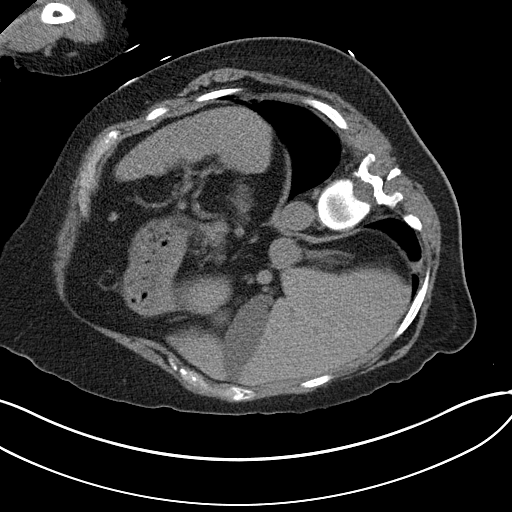
[im 8/30  soft-tissue]
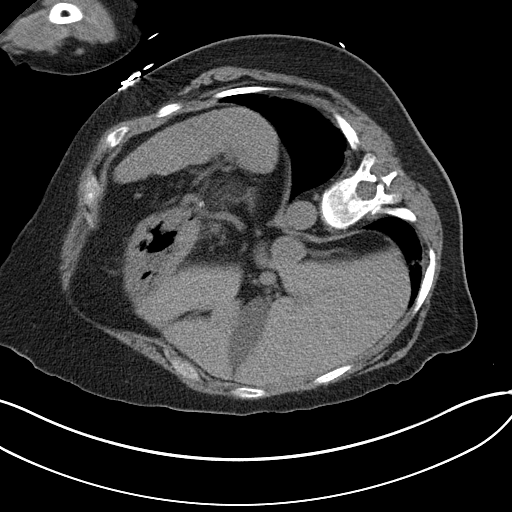
[im 10/30  soft-tissue]
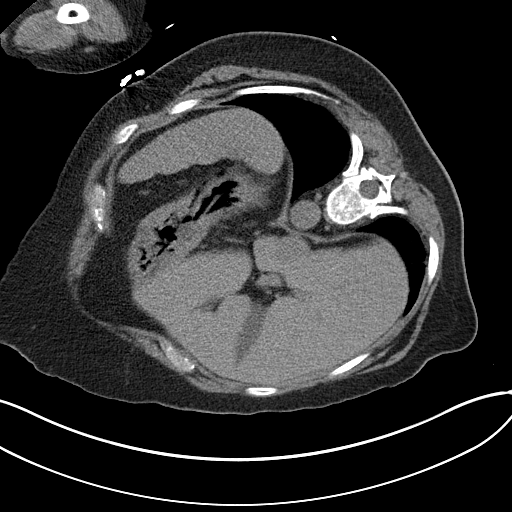
[im 12/30  soft-tissue]
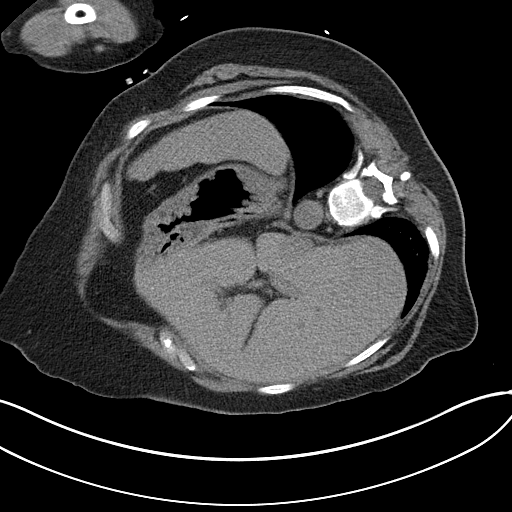
[im 16/30  soft-tissue]
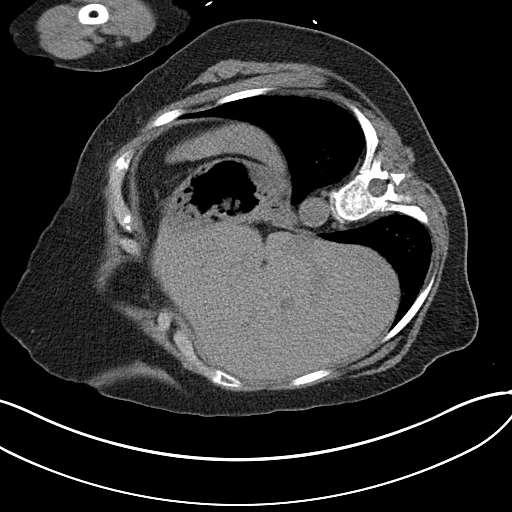
[im 18/30  soft-tissue]
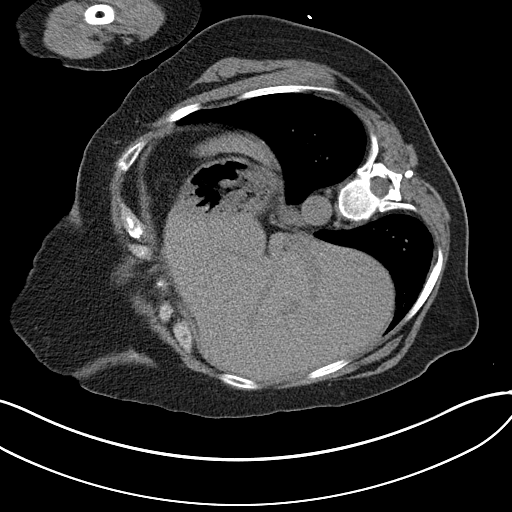
[im 20/30  soft-tissue]
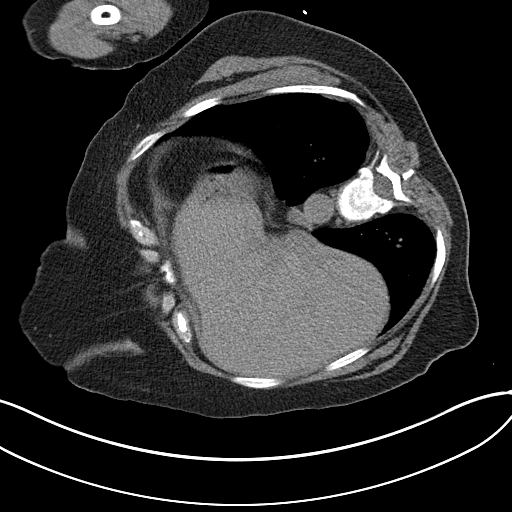
[im 20/30  bone]
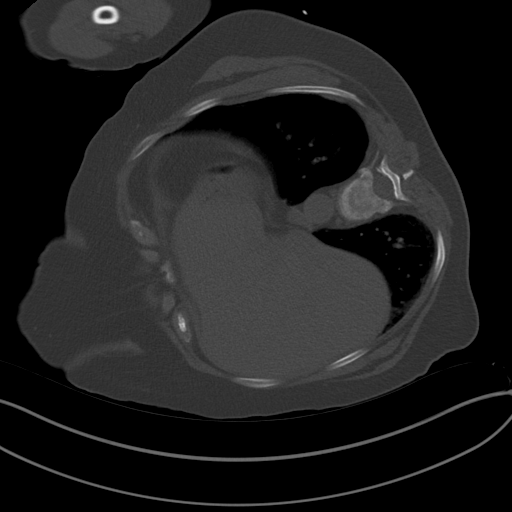
[im 22/30  soft-tissue]
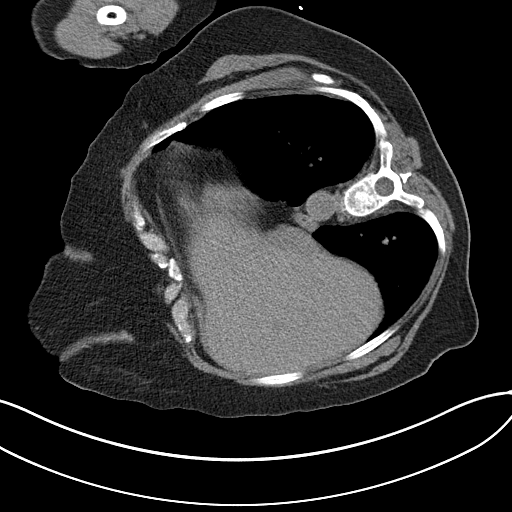
[im 22/30  lung]
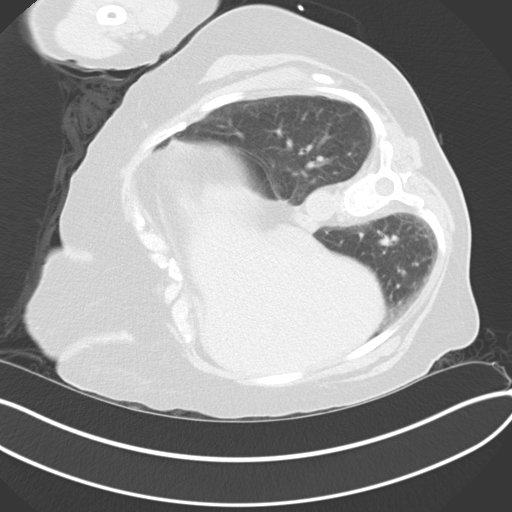
[im 24/30  soft-tissue]
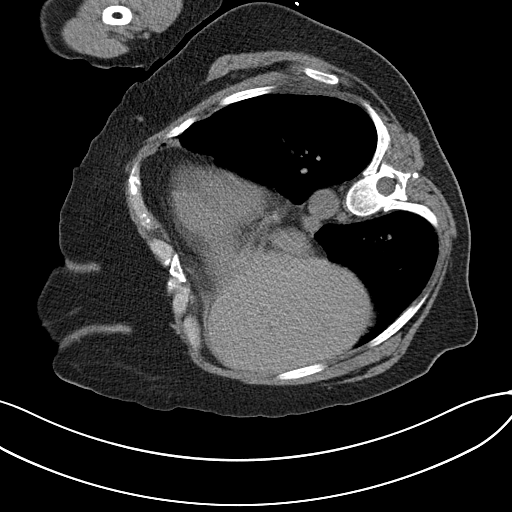
[im 24/30  lung]
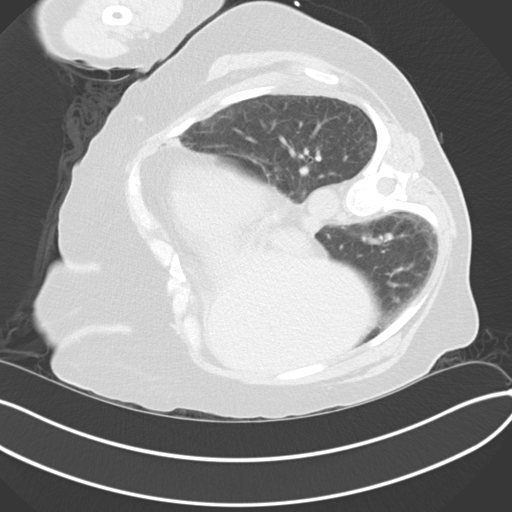
[im 26/30  soft-tissue]
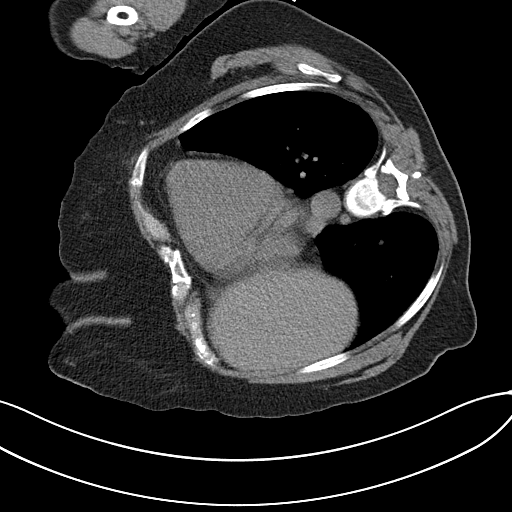
[im 26/30  lung]
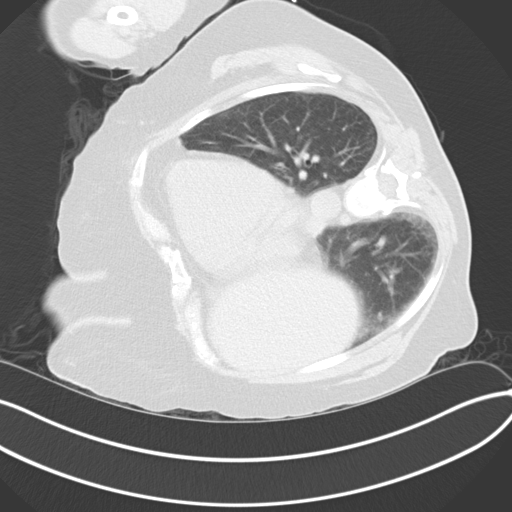
[im 28/30  soft-tissue]
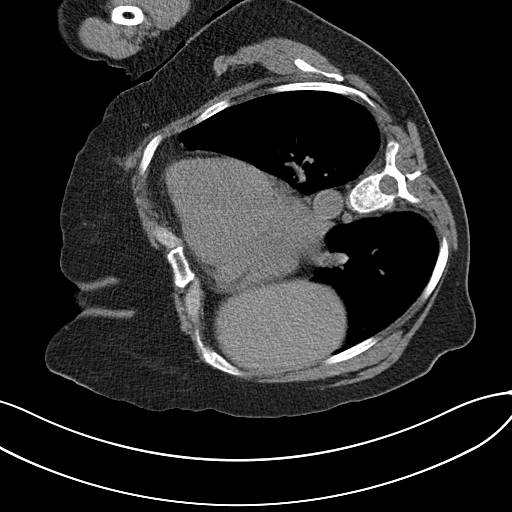
[im 28/30  lung]
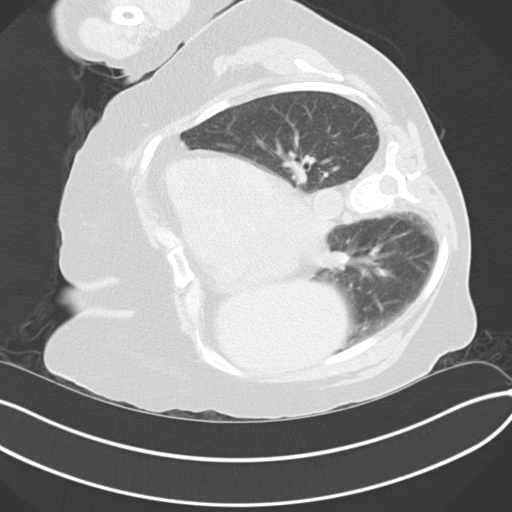

[13 of 32 positions shown; findings below may reference images not displayed]

EXAM:
CT BIOPSY

MEDICATIONS:
None.

ANESTHESIA/SEDATION:
Fentanyl 100 mcg IV; Versed 2 mg IV

Moderate Sedation Time:  15

The patient was continuously monitored during the procedure by the
interventional radiology nurse under my direct supervision.

FLUOROSCOPY TIME:  Fluoroscopy Time:  minutes  seconds ( mGy).

COMPLICATIONS:
None immediate.

PROCEDURE:
Informed written consent was obtained from the patient after a
thorough discussion of the procedural risks, benefits and
alternatives. All questions were addressed. Maximal Sterile Barrier
Technique was utilized including caps, mask, sterile gowns, sterile
gloves, sterile drape, hand hygiene and skin antiseptic. A timeout
was performed prior to the initiation of the procedure.

Under CT guidance, a(n) 17 gauge guide needle was advanced into the
right lower lobe lung nodule. Subsequently 2 18 gauge core biopsies
were obtained. The guide needle was removed. Post biopsy images
demonstrate no pneumothorax.

Patient tolerated the procedure well without complication. Vital
sign monitoring by nursing staff during the procedure will continue
as patient is in the special procedures unit for post procedure
observation.

The images document guide needle placement within the right lower
lobe lung nodule. Post biopsy images demonstrate no pneumothorax.
FINDINGS: Imaging documents needle placement in the right lower lobe lung
nodule. Post biopsy images demonstrate no pneumothorax.
IMPRESSION: Successful right lower lobe lung nodule biopsy.

## 2017-07-08 DIAGNOSIS — H608X3 Other otitis externa, bilateral: Secondary | ICD-10-CM | POA: Diagnosis not present

## 2017-07-17 DIAGNOSIS — H6983 Other specified disorders of Eustachian tube, bilateral: Secondary | ICD-10-CM | POA: Diagnosis not present

## 2017-07-27 ENCOUNTER — Telehealth: Payer: Self-pay | Admitting: Internal Medicine

## 2017-07-27 MED ORDER — AMOXICILLIN-POT CLAVULANATE 875-125 MG PO TABS: 1.0000 | ORAL_TABLET | Freq: Two times a day (BID) | ORAL | 0 refills | Status: DC

## 2017-07-27 NOTE — Telephone Encounter (Signed)
Pt states she is having a diverticulitis flare, states it started Saturday. Reports she is having LLQ pain, no fever, no blood in stool. Reports she took librax all weekend and it did not help. Pt states she cannot take Flagyl. There are no midlevel appointments available. Please advise.

## 2017-07-27 NOTE — Telephone Encounter (Signed)
Augmentin 875 mg twice a day for 7 days. Mid-level appointment next week

## 2017-07-27 NOTE — Telephone Encounter (Signed)
Pt aware, script sent to pharmacy, pt scheduled to see Tye Savoy NP 08/05/17@3pm .

## 2017-08-05 ENCOUNTER — Encounter: Payer: Self-pay | Admitting: Nurse Practitioner

## 2017-08-05 ENCOUNTER — Ambulatory Visit (INDEPENDENT_AMBULATORY_CARE_PROVIDER_SITE_OTHER): Payer: Medicare Other | Admitting: Nurse Practitioner

## 2017-08-05 VITALS — BP 120/60 | HR 84 | Ht 64.0 in | Wt 191.0 lb

## 2017-08-05 DIAGNOSIS — K5732 Diverticulitis of large intestine without perforation or abscess without bleeding: Secondary | ICD-10-CM

## 2017-08-05 NOTE — Patient Instructions (Signed)
Normal BMI (Body Mass Index- based on height and weight) is between 23 and 30. Your BMI today is Body mass index is 32.79 kg/m. Marland Kitchen Please consider follow up  regarding your BMI with your Primary Care Provider.    Please follow up as needed.

## 2017-08-05 NOTE — Progress Notes (Signed)
      IMPRESSION and PLAN:     72 yo female with recent episode of LLQ pain reminiscent of diverticulitis.. Pain resolved with course of Augmentin. Abdominal exam benign.  -Call for recurrent pain    HPI:    Chief Complaint: diverticulitis   Patient is a 72 yo female known to Dr. Henrene Pastor. Patient has history of recurrent diverticulitis.  Several days ago patient developed left lower quadrant pain, reminscent of diverticulitis. She started Librax every 6 hours without relief.  She had no associated fevers, nausea or vomiting.  Bowel movements were at baseline.  Patient called the office and was given a week of Augmentin.  Within 3-4 days her pain had resolved.   Review of systems:   No urinary symptoms.  No chest pain, no shortness of breath.  Past Medical History:  Diagnosis Date  . Arthritis   . 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 Colon polyps    Hyperplastic   . Diverticulosis of colon (without mention of hemorrhage)   . Esophageal stricture   . GERD (gastroesophageal reflux disease)   . History of radiation therapy   . History of urinary tract infection   . Neuromuscular disorder ([place])    peripheral nueropathy  . Numbness    feet bilat at nighttime  . Personal history of radiation therapy 2006  . Restless leg syndrome 03/27/2014  . Restless legs syndrome (RLS)   . Toe pain, right 09/2014   for 2 wks  . Urinary urgency     Patient's surgical history, family medical history, social history, medications and allergies were all reviewed in Epic    Physical Exam:     BP 120/60   Pulse 84   Ht '5\' 4"'$  (1.626 m)   Wt 191 lb (86.6 kg)   BMI 32.79 kg/m   GENERAL:  Pleasant white female female in NAD PSYCH: :Pleasant, cooperative, normal affect EENT:  conjunctiva pink, mucous membranes moist, neck supple without masses CARDIAC:  RRR,  No murmur heard, no peripheral edema PULM: Normal respiratory effort, lungs CTA bilaterally, no  wheezing ABDOMEN:  Nondistended, soft, nontender. No obvious masses, no hepatomegaly,  normal bowel sounds SKIN:  turgor, no lesions seen Musculoskeletal:  Normal muscle tone, normal strength NEURO: Alert and oriented x 3, no focal neurologic deficits   Tye Savoy , NP 08/05/2017, 3:28 PM

## 2017-08-09 ENCOUNTER — Encounter: Payer: Self-pay | Admitting: Nurse Practitioner

## 2017-08-10 NOTE — Progress Notes (Signed)
Assessment and plan reviewed 

## 2017-08-14 DIAGNOSIS — R3915 Urgency of urination: Secondary | ICD-10-CM | POA: Diagnosis not present

## 2017-09-03 ENCOUNTER — Other Ambulatory Visit: Payer: Self-pay | Admitting: Internal Medicine

## 2017-10-13 ENCOUNTER — Other Ambulatory Visit: Payer: Self-pay | Admitting: Obstetrics & Gynecology

## 2017-10-13 DIAGNOSIS — Z1231 Encounter for screening mammogram for malignant neoplasm of breast: Secondary | ICD-10-CM

## 2017-10-23 DIAGNOSIS — M8580 Other specified disorders of bone density and structure, unspecified site: Secondary | ICD-10-CM | POA: Diagnosis not present

## 2017-10-23 DIAGNOSIS — Z6835 Body mass index (BMI) 35.0-35.9, adult: Secondary | ICD-10-CM | POA: Diagnosis not present

## 2017-10-23 DIAGNOSIS — N189 Chronic kidney disease, unspecified: Secondary | ICD-10-CM | POA: Diagnosis not present

## 2017-10-23 DIAGNOSIS — E669 Obesity, unspecified: Secondary | ICD-10-CM | POA: Diagnosis not present

## 2017-10-23 DIAGNOSIS — H6123 Impacted cerumen, bilateral: Secondary | ICD-10-CM | POA: Diagnosis not present

## 2017-10-23 DIAGNOSIS — R7301 Impaired fasting glucose: Secondary | ICD-10-CM | POA: Diagnosis not present

## 2017-10-23 DIAGNOSIS — Z79899 Other long term (current) drug therapy: Secondary | ICD-10-CM | POA: Diagnosis not present

## 2017-10-23 DIAGNOSIS — Z86010 Personal history of colonic polyps: Secondary | ICD-10-CM | POA: Diagnosis not present

## 2017-10-23 DIAGNOSIS — Z853 Personal history of malignant neoplasm of breast: Secondary | ICD-10-CM | POA: Diagnosis not present

## 2017-10-23 DIAGNOSIS — G2581 Restless legs syndrome: Secondary | ICD-10-CM | POA: Diagnosis not present

## 2017-10-23 DIAGNOSIS — Z0000 Encounter for general adult medical examination without abnormal findings: Secondary | ICD-10-CM | POA: Diagnosis not present

## 2017-10-23 DIAGNOSIS — Z8719 Personal history of other diseases of the digestive system: Secondary | ICD-10-CM | POA: Diagnosis not present

## 2017-10-30 DIAGNOSIS — Z79899 Other long term (current) drug therapy: Secondary | ICD-10-CM | POA: Diagnosis not present

## 2017-10-30 DIAGNOSIS — Z86010 Personal history of colonic polyps: Secondary | ICD-10-CM | POA: Diagnosis not present

## 2017-10-30 DIAGNOSIS — M8580 Other specified disorders of bone density and structure, unspecified site: Secondary | ICD-10-CM | POA: Diagnosis not present

## 2017-10-30 DIAGNOSIS — R7301 Impaired fasting glucose: Secondary | ICD-10-CM | POA: Diagnosis not present

## 2017-10-30 DIAGNOSIS — G2581 Restless legs syndrome: Secondary | ICD-10-CM | POA: Diagnosis not present

## 2017-10-30 DIAGNOSIS — Z8719 Personal history of other diseases of the digestive system: Secondary | ICD-10-CM | POA: Diagnosis not present

## 2017-10-30 DIAGNOSIS — Z0000 Encounter for general adult medical examination without abnormal findings: Secondary | ICD-10-CM | POA: Diagnosis not present

## 2017-10-30 DIAGNOSIS — N189 Chronic kidney disease, unspecified: Secondary | ICD-10-CM | POA: Diagnosis not present

## 2017-10-30 DIAGNOSIS — H6120 Impacted cerumen, unspecified ear: Secondary | ICD-10-CM | POA: Diagnosis not present

## 2017-10-30 DIAGNOSIS — M899 Disorder of bone, unspecified: Secondary | ICD-10-CM | POA: Diagnosis not present

## 2017-10-30 DIAGNOSIS — Z853 Personal history of malignant neoplasm of breast: Secondary | ICD-10-CM | POA: Diagnosis not present

## 2017-11-27 ENCOUNTER — Ambulatory Visit
Admission: RE | Admit: 2017-11-27 | Discharge: 2017-11-27 | Disposition: A | Discharge status: Home / Self Care | Payer: Medicare Other | Source: Ambulatory Visit | Attending: Obstetrics & Gynecology | Admitting: Obstetrics & Gynecology

## 2017-11-27 DIAGNOSIS — Z1231 Encounter for screening mammogram for malignant neoplasm of breast: Secondary | ICD-10-CM | POA: Diagnosis not present

## 2017-12-02 DIAGNOSIS — Z6835 Body mass index (BMI) 35.0-35.9, adult: Secondary | ICD-10-CM | POA: Diagnosis not present

## 2017-12-02 DIAGNOSIS — Z124 Encounter for screening for malignant neoplasm of cervix: Secondary | ICD-10-CM | POA: Diagnosis not present

## 2017-12-02 DIAGNOSIS — M8588 Other specified disorders of bone density and structure, other site: Secondary | ICD-10-CM | POA: Diagnosis not present

## 2017-12-02 DIAGNOSIS — N958 Other specified menopausal and perimenopausal disorders: Secondary | ICD-10-CM | POA: Diagnosis not present

## 2017-12-02 DIAGNOSIS — Z01419 Encounter for gynecological examination (general) (routine) without abnormal findings: Secondary | ICD-10-CM | POA: Diagnosis not present

## 2017-12-02 DIAGNOSIS — N390 Urinary tract infection, site not specified: Secondary | ICD-10-CM | POA: Diagnosis not present

## 2017-12-02 DIAGNOSIS — R300 Dysuria: Secondary | ICD-10-CM | POA: Diagnosis not present

## 2017-12-02 NOTE — Progress Notes (Signed)
[facility]  PATIENT: Meghan Meghan [name] DOB: 05/29/1945   REASON FOR VISIT: Follow-up for restless legs  HISTORY FROM: Patient    HISTORY OF PRESENT ILLNESS:UPDATE August 1, 2019CM Meghan Meghan [name], 72 year old female returns for follow-up with a history of restless leg syndrome.  She is currently on gabapentin and Mirapex with good control of her symptoms.  She has not had any compulsive behaviors.  She does not exercise since she fractured her ankle back in August 2016 and she was encouraged to do so especially since she has osteopenia.  She has a new  complaint today of intermittent tremor which is not seen on exam.  She thinks it usually occurs when she is stressed.  She has no other complaints she returns for reevaluation     UPDATE 07/31/2018CM Meghan Meghan [name], 72 year old female returns for follow-up for history of restless leg syndrome. She is currently on gabapentin and Mirapex with good control of her symptoms. She denies any compulsive behaviors on Mirapex. She continues to be fairly active however her left ankle still bothers her at times from when she fractured her ankle back in August 2016. She occasionally has swelling in the left foot and some numbness at times. She is able to sleep during the night with her medications and rarely has to get up due to her restless legs. She returns for reevaluation   UPDATE 12/01/2017CM Meghan Meghan [name] 72 year old female returns for follow-up. She has a history of restless leg syndrome and is currently on Mirapex with good relief. She finished her therapy for her fractured ankle on the left leg back in August that she continues to have some swelling and a mild limp when walking. She is not nearly as active and has gained some weight. She claims she tries to walk for exercise but after about 10 minutes her left foot begins to bother her and she stops. She is going to to the gym to work on upper body strength and does ride the bicycle. She  continues to complain of some numbness in the left foot. She returns for reevaluation   10/04/15 KWMs. Meghan [name] is a 72 year old right-handed white female with a history of restless leg syndrome. The symptoms are primarily at nighttime. She has been seen initially for paresthesias on all fours, but this issue has completely resolved. The patient has been on gabapentin for the restless legs, this has not been completely effective. She takes 600 mg at night but still has restless leg symptoms. She fell 6 weeks ago and has had compound fractures of the left lower leg requiring surgery. The patient is recovering from this, she has not yet weightbearing on the left leg. The patient returns this office for an evaluation. She is having some pain around the surgical site.  REVIEW OF SYSTEMS: Full 14 system review of systems performed and notable only for those listed, all others are neg:  Constitutional: neg  Cardiovascular: neg Ear/Nose/Throat: neg  Skin: neg Eyes: neg Respiratory: neg Gastroitestinal: neg  Hematology/Lymphatic: neg  Endocrine: neg Musculoskeletal:neg Allergy/Immunology: neg Neurological: neg Psychiatric: neg Sleep : Restless leg syndrome   ALLERGIES: Allergies  Allergen Reactions  . Flagyl [Metronidazole]   . Nsaids Other (See Comments)    CKD - per PCP  . Tetracycline Hcl Other (See Comments)    HOME MEDICATIONS: Outpatient Medications Prior to Visit  Medication Sig Dispense Refill  . Cholecalciferol (VITAMIN D3) 2000 units TABS Take 2,000 Units by mouth daily.     . clidinium-chlordiazePOXIDE (LIBRAX)  5-2.5 MG capsule Take 1 capsule by mouth as needed. 100 capsule 3  . Cranberry-Vitamin C-Probiotic (AZO CRANBERRY) 250-30 MG TABS Take 1 tablet by mouth 2 (two) times daily.     Marland Kitchen FIBER SELECT GUMMIES PO Take 2 tablets by mouth at bedtime.     . fluticasone (FLONASE) 50 MCG/ACT nasal spray Place 1 spray into both nostrils daily as needed for allergies or rhinitis.     Marland Kitchen  gabapentin (NEURONTIN) 300 MG capsule Take 2 capsules (600 mg total) by mouth at bedtime. 180 capsule 3  . Misc Natural Products (OSTEO BI-FLEX ADV TRIPLE ST) TABS Take 1 tablet by mouth 2 (two) times daily.    . Multiple Vitamin (MULTIVITAMIN) tablet Take 1 tablet by mouth every evening.     . Omega-3 Fatty Acids (FISH OIL) 1200 MG CAPS Take 1,200 mg by mouth daily.     . pantoprazole (PROTONIX) 40 MG tablet TAKE 1 TABLET DAILY 90 tablet 1  . pramipexole (MIRAPEX) 0.5 MG tablet Take 1 tablet (0.5 mg total) by mouth at bedtime. 90 tablet 3  . Calcium Citrate (CITRACAL PO) Take 600 mg by mouth daily.      No facility-administered medications prior to visit.     PAST MEDICAL HISTORY: Past Medical History:  Diagnosis Date  . Arthritis   . 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 Colon polyps    Hyperplastic   . Diverticulosis of colon (without mention of hemorrhage)   . Esophageal stricture   . GERD (gastroesophageal reflux disease)   . History of radiation therapy   . History of urinary tract infection   . Neuromuscular disorder ([place])    peripheral nueropathy  . Numbness    feet bilat at nighttime  . Personal history of radiation therapy 2006  . Restless leg syndrome 03/27/2014  . Restless legs syndrome (RLS)   . Toe pain, right 09/2014   for 2 wks  . Urinary urgency     PAST SURGICAL HISTORY: Past Surgical History:  Procedure Laterality Date  . BREAST LUMPECTOMY Left 2006  . BREAST SURGERY     left 2006  . CESAREAN SECTION    . ORIF ANKLE FRACTURE Left 08/24/2015   Procedure: OPEN REDUCTION INTERNAL FIXATION (ORIF) LEFT ANKLE FRACTURE;  Surgeon: Mcarthur Rossetti, MD;  Location: [facility];  Service: Orthopedics;  Laterality: Left;  General with a popliteal block    FAMILY HISTORY: Family History  Problem Relation Age of Onset  . COPD Father   . Heart disease Father   . Emphysema Father        smoker  . Breast cancer Sister 25         mastectomy; metastasis to liver and lung  . Heart disease Maternal Grandmother   . Congestive Heart Failure Maternal Grandmother   . Heart disease Paternal Grandmother   . Obesity Brother   . Heart attack Mother   . Breast cancer Maternal Aunt        dx. late 8s  . Alzheimer's disease Maternal Aunt   . Breast cancer Maternal Aunt        dx. 27s  . Squamous cell carcinoma Maternal Aunt        recently removed; dx. 95  . Breast cancer Cousin 62       genetic testing in approx 2015  . Ovarian cancer Cousin        dx. 96s  . Esophageal cancer Cousin  not a smoker  . Neuropathy Neg Hx   . Thyroid disease Neg Hx     SOCIAL HISTORY: Social History   Socioeconomic History  . Marital status: Married    Spouse name: Not on file  . Number of children: 2  . Years of education: AS  . Highest education level: Not on file  Occupational History  . Occupation: Programmer, multimedia: [employer]  . Occupation: Retired  Scientific laboratory technician  . Financial resource strain: Not on file  . Food insecurity:    Worry: Not on file    Inability: Not on file  . Transportation needs:    Medical: Not on file    Non-medical: Not on file  Tobacco Use  . Smoking status: Never Smoker  . Smokeless tobacco: Never Used  Substance and Sexual Activity  . Alcohol use: Yes    Alcohol/week: 0.0 oz    Comment: very rarely - wine  . Drug use: No  . Sexual activity: Yes  Lifestyle  . Physical activity:    Days per week: Not on file    Minutes per session: Not on file  . Stress: Not on file  Relationships  . Social connections:    Talks on phone: Not on file    Gets together: Not on file    Attends religious service: Not on file    Active member of club or organization: Not on file    Attends meetings of clubs or organizations: Not on file    Relationship status: Not on file  . Intimate partner violence:    Fear of current or ex partner: Not on file    Emotionally abused: Not on file     Physically abused: Not on file    Forced sexual activity: Not on file  Other Topics Concern  . Not on file  Social History Narrative   Lives at home w/ her husband   Right-handed   Occasionally drinks tea     PHYSICAL EXAM  Vitals:   12/03/17 0749  BP: (!) 154/88  Pulse: (!) 102  Weight: 197 lb 3.2 oz (89.4 kg)  Height: 5' 4"  (1.626 m)   Body mass index is 33.85 kg/m.  Generalized: Well developed, Obese female in no acute distress  Head: normocephalic and atraumatic,. Oropharynx benign  Neck: Supple,  Musculoskeletal: No deformity   Neurological examination   Mentation: Alert oriented to time, place, history taking. Attention span and concentration appropriate. Recent and remote memory intact.  Follows all commands speech and language fluent.   Cranial nerve II-XII: .Pupils were equal round reactive to light extraocular movements were full, visual field were full on confrontational test. Facial sensation and strength were normal. hearing was intact to finger rubbing bilaterally. Uvula tongue midline. head turning and shoulder shrug were normal and symmetric.Tongue protrusion into cheek strength was normal. Motor: normal bulk and tone, full strength in the BUE, BLE, fine finger movements normal,  No focal weakness Sensory: normal and symmetric to light touch, pinprick, and  Vibration in the upper and lower extremities  Coordination: finger-nose-finger, heel-to-shin bilaterally, no dysmetria, no tremor Reflexes: Symmetric upper and lower ,plantar responses were flexor bilaterally. Gait and Station: Rising up from seated position without assistance, normal stance,  moderate stride, good arm swing, smooth turning, unable to perform tiptoe, and heel walking. Tandem gait is mildly unsteady no assistive device. Mild limp on the left  DIAGNOSTIC DATA (LABS, IMAGING, TESTING) - I reviewed patient records, labs, notes, testing  and imaging myself where available.  Lab Results   Component Value Date   WBC 5.0 10/25/2015   HGB 13.4 10/25/2015   HCT 41.5 10/25/2015   MCV 91.6 10/25/2015   PLT 230 10/25/2015      Component Value Date/Time   NA 139 08/22/2015 1020   K 4.3 08/22/2015 1020   CL 105 08/22/2015 1020   CO2 24 08/22/2015 1020   GLUCOSE 122 (H) 08/22/2015 1020   BUN 21 (H) 08/22/2015 1020   CREATININE 1.08 (H) 08/22/2015 1020   CALCIUM 9.6 08/22/2015 1020   PROT 7.3 10/20/2013 0855   ALBUMIN 3.6 09/05/2009 1447   AST 31 09/05/2009 1447   ALT 29 09/05/2009 1447   ALKPHOS 44 09/05/2009 1447   BILITOT 0.5 09/05/2009 1447   GFRNONAA 51 (L) 08/22/2015 1020   GFRAA 59 (L) 08/22/2015 1020    ASSESSMENT AND PLAN  72 y.o. year old female  with a history of restless leg syndrome and paresthesias of the left foot.   Continue Mirapex at current dose will refill Continue gabapentin at current dose will  Refill Encouraged to exercise by walking for overall health and well-being Follow-up in 1 year and prn  Dennie Bible, [facility], [facility], APRN  [facility] [address] [place], [place] [address] [phone]

## 2017-12-03 ENCOUNTER — Ambulatory Visit (INDEPENDENT_AMBULATORY_CARE_PROVIDER_SITE_OTHER): Payer: Medicare Other | Admitting: Nurse Practitioner

## 2017-12-03 ENCOUNTER — Encounter: Payer: Self-pay | Admitting: Nurse Practitioner

## 2017-12-03 VITALS — BP 154/88 | HR 102 | Ht 64.0 in | Wt 197.2 lb

## 2017-12-03 DIAGNOSIS — G2581 Restless legs syndrome: Secondary | ICD-10-CM | POA: Diagnosis not present

## 2017-12-03 DIAGNOSIS — R209 Unspecified disturbances of skin sensation: Secondary | ICD-10-CM | POA: Diagnosis not present

## 2017-12-03 MED ORDER — GABAPENTIN 300 MG PO CAPS: 600.0000 mg | ORAL_CAPSULE | Freq: Every day | ORAL | 3 refills | Status: DC

## 2017-12-03 MED ORDER — PRAMIPEXOLE DIHYDROCHLORIDE 0.5 MG PO TABS: 0.5000 mg | ORAL_TABLET | Freq: Every day | ORAL | 3 refills | Status: DC

## 2017-12-03 NOTE — Patient Instructions (Signed)
Continue Mirapex at current dose will refill Continue gabapentin at current dose will  refill Follow-up in 1 year and prn

## 2017-12-03 NOTE — Progress Notes (Signed)
I have read the note, and I agree with the clinical assessment and plan.  Kinisha Soper K [place]   

## 2018-03-22 ENCOUNTER — Other Ambulatory Visit: Payer: Self-pay | Admitting: Internal Medicine

## 2018-04-06 DIAGNOSIS — H2513 Age-related nuclear cataract, bilateral: Secondary | ICD-10-CM | POA: Diagnosis not present

## 2018-04-06 DIAGNOSIS — H52203 Unspecified astigmatism, bilateral: Secondary | ICD-10-CM | POA: Diagnosis not present

## 2018-05-26 ENCOUNTER — Encounter: Payer: Self-pay | Admitting: Neurology

## 2018-06-04 ENCOUNTER — Other Ambulatory Visit: Payer: Self-pay

## 2018-06-04 DIAGNOSIS — I83891 Varicose veins of right lower extremities with other complications: Secondary | ICD-10-CM

## 2018-06-09 ENCOUNTER — Encounter: Payer: Medicare Other | Admitting: Vascular Surgery

## 2018-06-09 ENCOUNTER — Ambulatory Visit (HOSPITAL_COMMUNITY)
Admission: RE | Admit: 2018-06-09 | Discharge: 2018-06-09 | Disposition: A | Discharge status: Home / Self Care | Payer: Medicare Other | Source: Ambulatory Visit | Attending: Family | Admitting: Family

## 2018-06-09 DIAGNOSIS — I83891 Varicose veins of right lower extremities with other complications: Secondary | ICD-10-CM

## 2018-06-10 ENCOUNTER — Other Ambulatory Visit: Payer: Self-pay

## 2018-06-10 ENCOUNTER — Encounter: Payer: Self-pay | Admitting: Vascular Surgery

## 2018-06-10 ENCOUNTER — Ambulatory Visit (INDEPENDENT_AMBULATORY_CARE_PROVIDER_SITE_OTHER): Payer: Medicare Other | Admitting: Vascular Surgery

## 2018-06-10 VITALS — BP 158/88 | HR 82 | Temp 97.3°F | Resp 18 | Ht 64.0 in | Wt 192.0 lb

## 2018-06-10 DIAGNOSIS — I83813 Varicose veins of bilateral lower extremities with pain: Secondary | ICD-10-CM | POA: Diagnosis not present

## 2018-06-10 NOTE — Progress Notes (Signed)
REASON FOR CONSULT:    Painful varicose veins.  The patient is self-referred.  HPI:   Meghan [name] is a pleasant 73 y.o. female, who presents for evaluation of painful varicose veins of both lower extremities but more significantly on the right side.  She has spider veins in her thighs and legs bilaterally and over the last several years these have gradually progressed.  She experiences some aching pain and heaviness in her legs.  The symptoms are aggravated by standing and relieved somewhat with elevation.  She does wear knee-high compression stockings when she works.  She works at [employer] some which requires a significant amount of standing.  There are no other aggravating or alleviating factors.  She is had no previous bleeding episodes.  She denies any history of DVT or phlebitis.  She is otherwise quite healthy.  She has not required ibuprofen for pain and tries to avoid NSAID's.  Past Medical History:  Diagnosis Date  . Arthritis   . 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 Colon polyps    Hyperplastic   . Diverticulosis of colon (without mention of hemorrhage)   . Esophageal stricture   . GERD (gastroesophageal reflux disease)   . History of radiation therapy   . History of urinary tract infection   . Neuromuscular disorder ([place])    peripheral nueropathy  . Numbness    feet bilat at nighttime  . Personal history of radiation therapy 2006  . Restless leg syndrome 03/27/2014  . Restless legs syndrome (RLS)   . Toe pain, right 09/2014   for 2 wks  . Urinary urgency     Family History  Problem Relation Age of Onset  . COPD Father   . Heart disease Father   . Emphysema Father        smoker  . Breast cancer Sister 52       mastectomy; metastasis to liver and lung  . Heart disease Maternal Grandmother   . Congestive Heart Failure Maternal Grandmother   . Heart disease Paternal Grandmother   . Obesity Brother   . Heart attack Mother    . Breast cancer Maternal Aunt        dx. late 66s  . Alzheimer's disease Maternal Aunt   . Breast cancer Maternal Aunt        dx. 62s  . Squamous cell carcinoma Maternal Aunt        recently removed; dx. 95  . Breast cancer Cousin 62       genetic testing in approx 2015  . Ovarian cancer Cousin        dx. 73s  . Esophageal cancer Cousin        not a smoker  . Neuropathy Neg Hx   . Thyroid disease Neg Hx     SOCIAL HISTORY: Social History   Socioeconomic History  . Marital status: Married    Spouse name: Not on file  . Number of children: 2  . Years of education: AS  . Highest education level: Not on file  Occupational History  . Occupation: Programmer, multimedia: [employer]  . Occupation: Retired  Scientific laboratory technician  . Financial resource strain: Not on file  . Food insecurity:    Worry: Not on file    Inability: Not on file  . Transportation needs:    Medical: Not on file    Non-medical: Not on file  Tobacco Use  .  Smoking status: Never Smoker  . Smokeless tobacco: Never Used  Substance and Sexual Activity  . Alcohol use: Yes    Alcohol/week: 0.0 standard drinks    Comment: very rarely - wine  . Drug use: No  . Sexual activity: Yes  Lifestyle  . Physical activity:    Days per week: Not on file    Minutes per session: Not on file  . Stress: Not on file  Relationships  . Social connections:    Talks on phone: Not on file    Gets together: Not on file    Attends religious service: Not on file    Active member of club or organization: Not on file    Attends meetings of clubs or organizations: Not on file    Relationship status: Not on file  . Intimate partner violence:    Fear of current or ex partner: Not on file    Emotionally abused: Not on file    Physically abused: Not on file    Forced sexual activity: Not on file  Other Topics Concern  . Not on file  Social History Narrative   Lives at home w/ her husband   Right-handed   Occasionally drinks  tea    Allergies  Allergen Reactions  . Flagyl [Metronidazole]   . Nsaids Other (See Comments)    CKD - per PCP  . Tetracycline Hcl Other (See Comments)    Current Outpatient Medications  Medication Sig Dispense Refill  . Cholecalciferol (VITAMIN D3) 2000 units TABS Take 2,000 Units by mouth daily.     . clidinium-chlordiazePOXIDE (LIBRAX) 5-2.5 MG capsule Take 1 capsule by mouth as needed. 100 capsule 3  . Cranberry-Vitamin C-Probiotic (AZO CRANBERRY) 250-30 MG TABS Take 1 tablet by mouth 2 (two) times daily.     Marland Kitchen FIBER SELECT GUMMIES PO Take 2 tablets by mouth at bedtime.     . fluticasone (FLONASE) 50 MCG/ACT nasal spray Place 1 spray into both nostrils daily as needed for allergies or rhinitis.     Marland Kitchen gabapentin (NEURONTIN) 300 MG capsule Take 2 capsules (600 mg total) by mouth at bedtime. 180 capsule 3  . Misc Natural Products (OSTEO BI-FLEX ADV TRIPLE ST) TABS Take 1 tablet by mouth 2 (two) times daily.    . Multiple Vitamin (MULTIVITAMIN) tablet Take 1 tablet by mouth every evening.     . Omega-3 Fatty Acids (FISH OIL) 1200 MG CAPS Take 1,200 mg by mouth daily.     . pantoprazole (PROTONIX) 40 MG tablet TAKE 1 TABLET DAILY 90 tablet 3  . pramipexole (MIRAPEX) 0.5 MG tablet Take 1 tablet (0.5 mg total) by mouth at bedtime. 90 tablet 3   No current facility-administered medications for this visit.     REVIEW OF SYSTEMS:  [X]  denotes positive finding, [ ]  denotes negative finding Cardiac  Comments:  Chest pain or chest pressure:    Shortness of breath upon exertion:    Short of breath when lying flat:    Irregular heart rhythm:        Vascular    Pain in calf, thigh, or hip brought on by ambulation:    Pain in feet at night that wakes you up from your sleep:     Blood clot in your veins:    Leg swelling:         Pulmonary    Oxygen at home:    Productive cough:     Wheezing:  Neurologic    Sudden weakness in arms or legs:     Sudden numbness in arms or  legs:     Sudden onset of difficulty speaking or slurred speech:    Temporary loss of vision in one eye:     Problems with dizziness:         Gastrointestinal    Blood in stool:     Vomited blood:         Genitourinary    Burning when urinating:     Blood in urine:        Psychiatric    Major depression:         Hematologic    Bleeding problems:    Problems with blood clotting too easily:        Skin    Rashes or ulcers:        Constitutional    Fever or chills:     PHYSICAL EXAM:   Vitals:   06/10/18 1317  BP: (!) 158/88  Pulse: 82  Resp: 18  Temp: (!) 97.3 F (36.3 C)  TempSrc: Oral  SpO2: 99%  Weight: 192 lb (87.1 kg)  Height: 5' 4"  (1.626 m)    GENERAL: The patient is a well-nourished female, in no acute distress. The vital signs are documented above. CARDIAC: There is a regular rate and rhythm.  VASCULAR: I do not detect carotid bruits. She has palpable femoral and pedal pulses bilaterally. VENOUS EXAM: She has spider veins and telangiectasias in both thighs but more significantly on the right side.  She also has spider veins in both legs anteriorly and also posteriorly.  She does not have any significantly dilated large truncal varicosities. I did look at her right great saphenous vein with the SonoSite myself.  The vein is enlarged and does have reflux starting at the saphenofemoral junction and extending down to the knee. PULMONARY: There is good air exchange bilaterally without wheezing or rales. ABDOMEN: Soft and non-tender with normal pitched bowel sounds.  MUSCULOSKELETAL: There are no major deformities or cyanosis. NEUROLOGIC: No focal weakness or paresthesias are detected. SKIN: There are no ulcers or rashes noted. PSYCHIATRIC: The patient has a normal affect.  DATA:    VENOUS DUPLEX: I have independently interpreted the patient's venous duplex scan that was done yesterday.  On the right side there is no evidence of deep venous thrombosis.   There is deep venous reflux involving the common femoral vein and femoral vein.  There is superficial venous reflux in the great saphenous vein from the proximal calf to the proximal thigh.  The great saphenous vein is significantly dilated especially in the distal mid and proximal thigh.   ASSESSMENT & PLAN:   CHRONIC VENOUS INSUFFICIENCY: This patient has significant deep and superficial venous reflux in the right leg although currently her symptoms are tolerable.  She has CEAP C1 venous disease.  I have discussed with her the importance of intermittent leg elevation the proper positioning for this.  I have written her prescription for knee-high compression stockings and she would like to try the 20-30 and is being fitted for those today.  I encouraged her to avoid prolonged sitting and standing.  We discussed the importance of exercise pacifically walking and water aerobics.  If her symptoms progress in the future then I think she would be a reasonable candidate for laser ablation of the right great saphenous vein which has significant reflux throughout.  She will call if her symptoms progress.  A total of 45 minutes was spent on this visit. 25 minutes was face to face time. More than 50% of the time was spent on counseling and coordinating with the patient.    Deitra Mayo [facility] [phone]

## 2018-09-24 DIAGNOSIS — L03116 Cellulitis of left lower limb: Secondary | ICD-10-CM | POA: Diagnosis not present

## 2018-10-12 ENCOUNTER — Other Ambulatory Visit: Payer: Self-pay | Admitting: Obstetrics & Gynecology

## 2018-10-12 DIAGNOSIS — Z1231 Encounter for screening mammogram for malignant neoplasm of breast: Secondary | ICD-10-CM

## 2018-10-25 DIAGNOSIS — N189 Chronic kidney disease, unspecified: Secondary | ICD-10-CM | POA: Diagnosis not present

## 2018-10-25 DIAGNOSIS — Z79899 Other long term (current) drug therapy: Secondary | ICD-10-CM | POA: Diagnosis not present

## 2018-10-25 DIAGNOSIS — M899 Disorder of bone, unspecified: Secondary | ICD-10-CM | POA: Diagnosis not present

## 2018-10-25 DIAGNOSIS — R7301 Impaired fasting glucose: Secondary | ICD-10-CM | POA: Diagnosis not present

## 2018-10-27 ENCOUNTER — Other Ambulatory Visit: Payer: Self-pay

## 2018-10-28 ENCOUNTER — Encounter: Payer: Self-pay | Admitting: Internal Medicine

## 2018-10-28 ENCOUNTER — Ambulatory Visit (INDEPENDENT_AMBULATORY_CARE_PROVIDER_SITE_OTHER): Payer: Medicare Other | Admitting: Internal Medicine

## 2018-10-28 VITALS — Ht 64.0 in | Wt 188.0 lb

## 2018-10-28 DIAGNOSIS — K219 Gastro-esophageal reflux disease without esophagitis: Secondary | ICD-10-CM

## 2018-10-28 MED ORDER — PANTOPRAZOLE SODIUM 40 MG PO TBEC: 40.0000 mg | DELAYED_RELEASE_TABLET | Freq: Every day | ORAL | 3 refills | Status: DC

## 2018-10-28 NOTE — Addendum Note (Signed)
Addended by: Irene Shipper on: 10/28/2018 12:20 PM   Modules accepted: Level of Service

## 2018-10-28 NOTE — Addendum Note (Signed)
Addended by: Marlon Pel L on: 10/28/2018 01:00 PM   Modules accepted: Orders

## 2018-10-28 NOTE — Patient Instructions (Signed)
Patient advised to avoid spicy, acidic, citrus, chocolate, mints, fruit and fruit juices.  Limit the intake of caffeine, alcohol and Soda.  Don't exercise too soon after eating.  Don't lie down within 3-4 hours of eating.  Elevate the head of your bed.  We have sent the following prescriptions to your mail in pharmacy: pantoprazole.   If you have not heard from your mail in pharmacy within 1 week or if you have not received your medication in the mail, please contact us at [phone] so we may find out why.

## 2018-10-28 NOTE — Progress Notes (Signed)
HISTORY OF PRESENT ILLNESS:  Meghan [name] is a 73 y.o. 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 Verl Blalock, who presents today for follow-up of her chronic reflux disease.  She has not been seen since January 2018.  She does have a history of peptic stricture for which she underwent esophageal dilation in 2007.  At the time of her last visit she was experiencing significant breakthrough symptoms.  We did change her medication from Protonix to Nexium.  She is currently back on Protonix.  She tells me that overall she has been doing well.  She has rare pill only dysphasia which she does not deem significant enough to perform endoscopy.  Reflux has been doing well except the past 6 months when she has noticed nocturnal symptoms once or twice per month.  This is treated with antacids.  She states that her weight has been stable though she has not had weight loss.  GI review of systems is otherwise negative.  Her last screening colonoscopy 2011 with hyperplastic polyp only.  REVIEW OF SYSTEMS:  All non-GI ROS negative unless otherwise stated in the HPI except for arthritis  Past Medical History:  Diagnosis Date  . Arthritis   . 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 Colon polyps    Hyperplastic   . Diverticulosis of colon (without mention of hemorrhage)   . Esophageal stricture   . GERD (gastroesophageal reflux disease)   . History of radiation therapy   . History of urinary tract infection   . Neuromuscular disorder ([place])    peripheral nueropathy  . Numbness    feet bilat at nighttime  . Personal history of radiation therapy 2006  . Restless leg syndrome 03/27/2014  . Restless legs syndrome (RLS)   . Toe pain, right 09/2014   for 2 wks  . Urinary urgency     Past Surgical History:  Procedure Laterality Date  . BREAST LUMPECTOMY Left 2006  . BREAST SURGERY     left 2006  . CESAREAN SECTION    . ORIF ANKLE FRACTURE Left  08/24/2015   Procedure: OPEN REDUCTION INTERNAL FIXATION (ORIF) LEFT ANKLE FRACTURE;  Surgeon: Mcarthur Rossetti, MD;  Location: [facility];  Service: Orthopedics;  Laterality: Left;  General with a popliteal block    Social History Meghan [name]  reports that she has never smoked. She has never used smokeless tobacco. She reports current alcohol use. She reports that she does not use drugs.  family history includes Alzheimer's disease in her maternal aunt; Breast cancer in her maternal aunt and maternal aunt; Breast cancer (age of onset: 92) in her sister; Breast cancer (age of onset: 38) in her cousin; COPD in her father; Congestive Heart Failure in her maternal grandmother; Emphysema in her father; Esophageal cancer in her cousin; Heart attack in her mother; Heart disease in her father, maternal grandmother, and paternal grandmother; Obesity in her brother; Ovarian cancer in her cousin; Squamous cell carcinoma in her maternal aunt.  Allergies  Allergen Reactions  . Flagyl [Metronidazole]   . Nsaids Other (See Comments)    CKD - per PCP  . Tetracycline Hcl Other (See Comments)       PHYSICAL EXAMINATION: Physical exam with telehealth medicine visit   ASSESSMENT:  1.  GERD complicated by peptic stricture.  Currently with the very mild stable pill dysphasia and likely nocturnal breakthrough symptoms as described above. 2.  Screening colonoscopy 2011 was  negative for neoplasia 3.  Overweight   PLAN:  1.  Reflux precautions.  In particular we discussed avoiding late meals, large meals, and weight loss 2.  REFILL PANTOPRAZOLE 40 mg daily; 1 year of refills.  Medication effects and side effects and risks reviewed 3.  Routine office follow-up 1 year 4.  Due for surveillance colonoscopy around December 2021 This telehealth medicine visit was initiated by and consented for by the patient.  She was in her home and I was in my office during the encounter.  She understands her may be an  associated professional charge for this service

## 2018-10-29 DIAGNOSIS — L03116 Cellulitis of left lower limb: Secondary | ICD-10-CM | POA: Diagnosis not present

## 2018-10-29 DIAGNOSIS — Z86010 Personal history of colonic polyps: Secondary | ICD-10-CM | POA: Diagnosis not present

## 2018-10-29 DIAGNOSIS — Z8719 Personal history of other diseases of the digestive system: Secondary | ICD-10-CM | POA: Diagnosis not present

## 2018-10-29 DIAGNOSIS — N189 Chronic kidney disease, unspecified: Secondary | ICD-10-CM | POA: Diagnosis not present

## 2018-10-29 DIAGNOSIS — Z853 Personal history of malignant neoplasm of breast: Secondary | ICD-10-CM | POA: Diagnosis not present

## 2018-10-29 DIAGNOSIS — M8580 Other specified disorders of bone density and structure, unspecified site: Secondary | ICD-10-CM | POA: Diagnosis not present

## 2018-10-29 DIAGNOSIS — R7301 Impaired fasting glucose: Secondary | ICD-10-CM | POA: Diagnosis not present

## 2018-10-29 DIAGNOSIS — G2581 Restless legs syndrome: Secondary | ICD-10-CM | POA: Diagnosis not present

## 2018-11-30 ENCOUNTER — Ambulatory Visit
Admission: RE | Admit: 2018-11-30 | Discharge: 2018-11-30 | Disposition: A | Discharge status: Home / Self Care | Payer: Medicare Other | Source: Ambulatory Visit | Attending: Obstetrics & Gynecology | Admitting: Obstetrics & Gynecology

## 2018-11-30 ENCOUNTER — Other Ambulatory Visit: Payer: Self-pay

## 2018-11-30 DIAGNOSIS — Z1231 Encounter for screening mammogram for malignant neoplasm of breast: Secondary | ICD-10-CM

## 2018-12-04 NOTE — Progress Notes (Signed)
PATIENT: Meghan [name] DOB: [birth year]  REASON FOR VISIT: follow up HISTORY FROM: patient  HISTORY OF PRESENT ILLNESS: Today 12/06/18  Meghan [name] is a 73 year old female with history of restless leg syndrome.  She remains on gabapentin and Mirapex with good control of her symptoms.  She is tolerating the medication without side effect.  The medication works well, some nights are more restless than others. For the last new months her big toes may feel numb. This is more bothersome at night. She denies any falls. She denies diabetes. She sees primary care routinely, everything was going well. She will take Extra Strength Tylenol with benefit if her symptoms wake her in the middle of the night. She is retired a Marine scientist.  She was working part-time at [employer] prior to [employer].  She presents today for follow-up unaccompanied.  HISTORYUPDATE December 03, 2017 CM Meghan [name], 73 year old female returns for follow-up with a history of restless leg syndrome.  She is currently on gabapentin and Mirapex with good control of her symptoms.  She has not had any compulsive behaviors.  She does not exercise since she fractured her ankle back in August 2016 and she was encouraged to do so especially since she has osteopenia.  She has a new  complaint today of intermittent tremor which is not seen on exam.  She thinks it usually occurs when she is stressed.  She has no other complaints she returns for reevaluation   REVIEW OF SYSTEMS: Out of a complete 14 system review of symptoms, the patient complains only of the following symptoms, and all other reviewed systems are negative.  Restless leg, numbness, insomnia  ALLERGIES: Allergies  Allergen Reactions  . Flagyl [Metronidazole]   . Nsaids Other (See Comments)    CKD - per PCP  . Tetracycline Hcl Other (See Comments)    HOME MEDICATIONS: Outpatient Medications Prior to Visit  Medication Sig Dispense Refill  . Cholecalciferol (VITAMIN D3) 2000 units TABS  Take 2,000 Units by mouth daily.     . Cranberry-Vitamin C-Probiotic (AZO CRANBERRY) 250-30 MG TABS Take 1 tablet by mouth 2 (two) times daily.     Marland Kitchen FIBER SELECT GUMMIES PO Take 2 tablets by mouth at bedtime.     . fluticasone (FLONASE) 50 MCG/ACT nasal spray Place 1 spray into both nostrils daily as needed for allergies or rhinitis.     Marland Kitchen gabapentin (NEURONTIN) 300 MG capsule Take 2 capsules (600 mg total) by mouth at bedtime. 180 capsule 3  . Multiple Vitamin (MULTIVITAMIN) tablet Take 1 tablet by mouth every evening.     . Omega-3 Fatty Acids (FISH OIL) 1200 MG CAPS Take 1,200 mg by mouth daily.     . pantoprazole (PROTONIX) 40 MG tablet Take 1 tablet (40 mg total) by mouth daily. 90 tablet 3  . pramipexole (MIRAPEX) 0.5 MG tablet Take 1 tablet (0.5 mg total) by mouth at bedtime. 90 tablet 3  . TURMERIC PO Take 1 capsule by mouth 2 (two) times a day. 1000 mg    . clidinium-chlordiazePOXIDE (LIBRAX) 5-2.5 MG capsule Take 1 capsule by mouth as needed. 100 capsule 3   No facility-administered medications prior to visit.     PAST MEDICAL HISTORY: Past Medical History:  Diagnosis Date  . Arthritis   . 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 Colon polyps    Hyperplastic   . Diverticulosis of colon (without mention of hemorrhage)   . Esophageal  stricture   . GERD (gastroesophageal reflux disease)   . History of radiation therapy   . History of urinary tract infection   . Neuromuscular disorder ([place])    peripheral nueropathy  . Numbness    feet bilat at nighttime  . Personal history of radiation therapy 2006  . Restless leg syndrome 03/27/2014  . Restless legs syndrome (RLS)   . Toe pain, right 09/2014   for 2 wks  . Urinary urgency     PAST SURGICAL HISTORY: Past Surgical History:  Procedure Laterality Date  . BREAST LUMPECTOMY Left 2006  . BREAST SURGERY     left 2006  . CESAREAN SECTION    . ORIF ANKLE FRACTURE Left 08/24/2015    Procedure: OPEN REDUCTION INTERNAL FIXATION (ORIF) LEFT ANKLE FRACTURE;  Surgeon: Mcarthur Rossetti, MD;  Location: [facility];  Service: Orthopedics;  Laterality: Left;  General with a popliteal block    FAMILY HISTORY: Family History  Problem Relation Age of Onset  . COPD Father   . Heart disease Father   . Emphysema Father        smoker  . Breast cancer Sister 42       mastectomy; metastasis to liver and lung  . Heart disease Maternal Grandmother   . Congestive Heart Failure Maternal Grandmother   . Heart disease Paternal Grandmother   . Obesity Brother   . Heart attack Mother   . Breast cancer Maternal Aunt        dx. late 41s  . Alzheimer's disease Maternal Aunt   . Breast cancer Maternal Aunt        dx. 74s  . Squamous cell carcinoma Maternal Aunt        recently removed; dx. 95  . Breast cancer Cousin 62       genetic testing in approx 2015  . Ovarian cancer Cousin        dx. 29s  . Esophageal cancer Cousin        not a smoker  . Neuropathy Neg Hx   . Thyroid disease Neg Hx     SOCIAL HISTORY: Social History   Socioeconomic History  . Marital status: Married    Spouse name: Not on file  . Number of children: 2  . Years of education: AS  . Highest education level: Not on file  Occupational History  . Occupation: Programmer, multimedia: [employer]  . Occupation: Retired  Scientific laboratory technician  . Financial resource strain: Not on file  . Food insecurity    Worry: Not on file    Inability: Not on file  . Transportation needs    Medical: Not on file    Non-medical: Not on file  Tobacco Use  . Smoking status: Never Smoker  . Smokeless tobacco: Never Used  Substance and Sexual Activity  . Alcohol use: Yes    Alcohol/week: 0.0 standard drinks    Comment: very rarely - wine  . Drug use: No  . Sexual activity: Yes  Lifestyle  . Physical activity    Days per week: Not on file    Minutes per session: Not on file  . Stress: Not on file  Relationships  .  Social Herbalist on phone: Not on file    Gets together: Not on file    Attends religious service: Not on file    Active member of club or organization: Not on file    Attends meetings of clubs or  organizations: Not on file    Relationship status: Not on file  . Intimate partner violence    Fear of current or ex partner: Not on file    Emotionally abused: Not on file    Physically abused: Not on file    Forced sexual activity: Not on file  Other Topics Concern  . Not on file  Social History Narrative   Lives at home w/ her husband   Right-handed   Occasionally drinks tea    PHYSICAL EXAM  Vitals:   12/06/18 0926  BP: 138/86  Pulse: 75  Temp: (!) 96.8 F (36 C)  Weight: 186 lb 9.6 oz (84.6 kg)  Height: 5' 4"  (1.626 m)   Body mass index is 32.03 kg/m.  Generalized: Well developed, in no acute distress   Neurological examination  Mentation: Alert oriented to time, place, history taking. Follows all commands speech and language fluent Cranial nerve II-XII: Pupils were equal round reactive to light. Extraocular movements were full, visual field were full on confrontational test. Facial sensation and strength were normal. Head turning and shoulder shrug  were normal and symmetric. Motor: The motor testing reveals 5 over 5 strength of all 4 extremities. Good symmetric motor tone is noted throughout.  Sensory: Sensory testing is intact to soft touch, pinprick and vibration on all 4 extremities. No evidence of extinction is noted.  Coordination: Cerebellar testing reveals good finger-nose-finger and heel-to-shin bilaterally.  Gait and station: Gait is normal. Tandem gait is normal. Romberg is negative. No drift is seen.  Reflexes: Deep tendon reflexes are symmetric and normal bilaterally.   DIAGNOSTIC DATA (LABS, IMAGING, TESTING) - I reviewed patient records, labs, notes, testing and imaging myself where available.  Lab Results  Component Value Date   WBC 5.0  10/25/2015   HGB 13.4 10/25/2015   HCT 41.5 10/25/2015   MCV 91.6 10/25/2015   PLT 230 10/25/2015      Component Value Date/Time   NA 139 08/22/2015 1020   K 4.3 08/22/2015 1020   CL 105 08/22/2015 1020   CO2 24 08/22/2015 1020   GLUCOSE 122 (H) 08/22/2015 1020   BUN 21 (H) 08/22/2015 1020   CREATININE 1.08 (H) 08/22/2015 1020   CALCIUM 9.6 08/22/2015 1020   PROT 7.3 10/20/2013 0855   ALBUMIN 3.6 09/05/2009 1447   AST 31 09/05/2009 1447   ALT 29 09/05/2009 1447   ALKPHOS 44 09/05/2009 1447   BILITOT 0.5 09/05/2009 1447   GFRNONAA 51 (L) 08/22/2015 1020   GFRAA 59 (L) 08/22/2015 1020   No results found for: CHOL, HDL, LDLCALC, LDLDIRECT, TRIG, CHOLHDL No results found for: HGBA1C No results found for: VITAMINB12 No results found for: TSH  ASSESSMENT AND PLAN 73 y.o. year old female  has a past medical history of Arthritis, Breast cancer ([place]) (2006), Colon polyps, Diverticulosis of colon (without mention of hemorrhage), Esophageal stricture, GERD (gastroesophageal reflux disease), History of radiation therapy, History of urinary tract infection, Neuromuscular disorder ([place]), Numbness, Personal history of radiation therapy (2006), Restless leg syndrome (03/27/2014), Restless legs syndrome (RLS), Toe pain, right (09/2014), and Urinary urgency. here with:  1.  Restless leg syndrome, paresthesias  She will continue taking Mirapex 0.5 mg and gabapentin 600 mg at bedtime.  This is providing adequate control of her symptoms.  I will send in a refill. Sometimes her symptoms may wake her in the middle of the night.  If extra strength Tylenol is not beneficial, she may take an additional 300 mg gabapentin.  She will monitor report of numbness to both toes, let me know if it worsens.  She had nerve conduction evaluation in 2015 of her left arm and both legs.  The study was normal, no evidence of a neuropathy seen.  She will follow-up in 1 year or sooner if needed.  I advised that if her symptoms  worsen or she develops any new symptoms she should let us know.   I spent 15 minutes with the patient. 50% of this time was spent discussing her plan of care.   Butler Denmark, AGNP-C, DNP 12/06/2018, 9:29 AM [facility] [address] [place], [place] [address] [phone]

## 2018-12-06 ENCOUNTER — Ambulatory Visit: Payer: Medicare Other | Admitting: Nurse Practitioner

## 2018-12-06 ENCOUNTER — Encounter: Payer: Self-pay | Admitting: Neurology

## 2018-12-06 ENCOUNTER — Other Ambulatory Visit: Payer: Self-pay

## 2018-12-06 ENCOUNTER — Ambulatory Visit (INDEPENDENT_AMBULATORY_CARE_PROVIDER_SITE_OTHER): Payer: Medicare Other | Admitting: Neurology

## 2018-12-06 VITALS — BP 138/86 | HR 75 | Temp 96.8°F | Ht 64.0 in | Wt 186.6 lb

## 2018-12-06 DIAGNOSIS — G2581 Restless legs syndrome: Secondary | ICD-10-CM

## 2018-12-06 MED ORDER — GABAPENTIN 300 MG PO CAPS: 600.0000 mg | ORAL_CAPSULE | Freq: Every day | ORAL | 3 refills | Status: DC

## 2018-12-06 MED ORDER — PRAMIPEXOLE DIHYDROCHLORIDE 0.5 MG PO TABS: 0.5000 mg | ORAL_TABLET | Freq: Every day | ORAL | 3 refills | Status: DC

## 2018-12-06 NOTE — Patient Instructions (Signed)
It was wonderful to meet you today! I will refill your medications. I will see you  1 year.

## 2018-12-06 NOTE — Progress Notes (Signed)
I have read the note, and I agree with the clinical assessment and plan.  Singleton Hickox K Stefany Starace   

## 2018-12-07 DIAGNOSIS — Z01419 Encounter for gynecological examination (general) (routine) without abnormal findings: Secondary | ICD-10-CM | POA: Diagnosis not present

## 2018-12-16 DIAGNOSIS — Z6832 Body mass index (BMI) 32.0-32.9, adult: Secondary | ICD-10-CM | POA: Diagnosis not present

## 2018-12-16 DIAGNOSIS — M5442 Lumbago with sciatica, left side: Secondary | ICD-10-CM | POA: Diagnosis not present

## 2019-02-22 DIAGNOSIS — M8580 Other specified disorders of bone density and structure, unspecified site: Secondary | ICD-10-CM | POA: Diagnosis not present

## 2019-02-22 DIAGNOSIS — Z853 Personal history of malignant neoplasm of breast: Secondary | ICD-10-CM | POA: Diagnosis not present

## 2019-02-22 DIAGNOSIS — C50912 Malignant neoplasm of unspecified site of left female breast: Secondary | ICD-10-CM | POA: Diagnosis not present

## 2019-02-22 DIAGNOSIS — N189 Chronic kidney disease, unspecified: Secondary | ICD-10-CM | POA: Diagnosis not present

## 2019-02-24 ENCOUNTER — Encounter: Payer: Self-pay | Admitting: Neurology

## 2019-04-13 DIAGNOSIS — H52203 Unspecified astigmatism, bilateral: Secondary | ICD-10-CM | POA: Diagnosis not present

## 2019-04-13 DIAGNOSIS — H2513 Age-related nuclear cataract, bilateral: Secondary | ICD-10-CM | POA: Diagnosis not present

## 2019-05-30 DIAGNOSIS — Z853 Personal history of malignant neoplasm of breast: Secondary | ICD-10-CM | POA: Diagnosis not present

## 2019-05-30 DIAGNOSIS — N189 Chronic kidney disease, unspecified: Secondary | ICD-10-CM | POA: Diagnosis not present

## 2019-05-30 DIAGNOSIS — M8580 Other specified disorders of bone density and structure, unspecified site: Secondary | ICD-10-CM | POA: Diagnosis not present

## 2019-06-28 ENCOUNTER — Other Ambulatory Visit: Payer: Self-pay | Admitting: Family Medicine

## 2019-06-28 ENCOUNTER — Other Ambulatory Visit (HOSPITAL_COMMUNITY): Payer: Self-pay | Admitting: Family Medicine

## 2019-06-28 DIAGNOSIS — M5442 Lumbago with sciatica, left side: Secondary | ICD-10-CM

## 2019-07-08 ENCOUNTER — Other Ambulatory Visit: Payer: Self-pay

## 2019-07-08 ENCOUNTER — Ambulatory Visit (HOSPITAL_COMMUNITY)
Admission: RE | Admit: 2019-07-08 | Discharge: 2019-07-08 | Disposition: A | Discharge status: Home / Self Care | Payer: Medicare Other | Source: Ambulatory Visit | Attending: Family Medicine | Admitting: Family Medicine

## 2019-07-08 DIAGNOSIS — M545 Low back pain: Secondary | ICD-10-CM | POA: Diagnosis not present

## 2019-07-08 DIAGNOSIS — M5442 Lumbago with sciatica, left side: Secondary | ICD-10-CM | POA: Insufficient documentation

## 2019-07-29 DIAGNOSIS — M4316 Spondylolisthesis, lumbar region: Secondary | ICD-10-CM | POA: Diagnosis not present

## 2019-07-29 DIAGNOSIS — M47816 Spondylosis without myelopathy or radiculopathy, lumbar region: Secondary | ICD-10-CM | POA: Diagnosis not present

## 2019-08-01 ENCOUNTER — Other Ambulatory Visit: Payer: Self-pay

## 2019-08-01 MED ORDER — CILIDINIUM-CHLORDIAZEPOXIDE 2.5-5 MG PO CAPS: 1.0000 | ORAL_CAPSULE | ORAL | 3 refills | Status: DC | PRN

## 2019-08-04 ENCOUNTER — Telehealth: Payer: Self-pay

## 2019-08-04 MED ORDER — CILIDINIUM-CHLORDIAZEPOXIDE 2.5-5 MG PO CAPS: 1.0000 | ORAL_CAPSULE | ORAL | 5 refills | Status: DC | PRN

## 2019-08-04 NOTE — Telephone Encounter (Signed)
Sent 30 day supply of Librax to [facility] per patient's request.

## 2019-08-15 DIAGNOSIS — M5116 Intervertebral disc disorders with radiculopathy, lumbar region: Secondary | ICD-10-CM | POA: Diagnosis not present

## 2019-08-15 DIAGNOSIS — M5416 Radiculopathy, lumbar region: Secondary | ICD-10-CM | POA: Diagnosis not present

## 2019-08-15 DIAGNOSIS — M4316 Spondylolisthesis, lumbar region: Secondary | ICD-10-CM | POA: Insufficient documentation

## 2019-09-09 ENCOUNTER — Other Ambulatory Visit: Payer: Self-pay | Admitting: Obstetrics & Gynecology

## 2019-09-09 DIAGNOSIS — Z1231 Encounter for screening mammogram for malignant neoplasm of breast: Secondary | ICD-10-CM

## 2019-10-13 ENCOUNTER — Other Ambulatory Visit: Payer: Self-pay

## 2019-10-13 ENCOUNTER — Ambulatory Visit (INDEPENDENT_AMBULATORY_CARE_PROVIDER_SITE_OTHER): Payer: Medicare Other | Admitting: Cardiology

## 2019-10-13 ENCOUNTER — Encounter: Payer: Self-pay | Admitting: Cardiology

## 2019-10-13 ENCOUNTER — Other Ambulatory Visit: Payer: Self-pay | Admitting: Cardiology

## 2019-10-13 VITALS — BP 140/76 | HR 96 | Temp 96.3°F | Ht 64.0 in | Wt 188.4 lb

## 2019-10-13 DIAGNOSIS — R0600 Dyspnea, unspecified: Secondary | ICD-10-CM | POA: Diagnosis not present

## 2019-10-13 DIAGNOSIS — R079 Chest pain, unspecified: Secondary | ICD-10-CM | POA: Diagnosis not present

## 2019-10-13 DIAGNOSIS — Z13220 Encounter for screening for lipoid disorders: Secondary | ICD-10-CM

## 2019-10-13 DIAGNOSIS — I10 Essential (primary) hypertension: Secondary | ICD-10-CM | POA: Diagnosis not present

## 2019-10-13 DIAGNOSIS — R0609 Other forms of dyspnea: Secondary | ICD-10-CM

## 2019-10-13 MED ORDER — LISINOPRIL 10 MG PO TABS: 10.0000 mg | ORAL_TABLET | Freq: Every day | ORAL | 0 refills | Status: DC

## 2019-10-13 NOTE — Patient Instructions (Signed)
Medication Instructions:  START lisinopril 10 mg daily  *If you need a refill on your cardiac medications before your next appointment, please call your pharmacy*   Lab Work: BMET, Lipid today  If you have labs (blood work) drawn today and your tests are completely normal, you will receive your results only by: Marland Kitchen MyChart Message (if you have MyChart) OR . A paper copy in the mail If you have any lab test that is abnormal or we need to change your treatment, we will call you to review the results.   Testing/Procedures: Your physician has requested that you have an echocardiogram. Echocardiography is a painless test that uses sound waves to create images of your heart. It provides your doctor with information about the size and shape of your heart and how well your heart's chambers and valves are working. This procedure takes approximately one hour. There are no restrictions for this procedure.  Your physician has requested that you have a lexiscan myoview. For further information please visit [email or web address]. Please follow instruction sheet, as given.  CT coronary calcium score. This test is done at [address]. This is $150 out of pocket.   Coronary CalciumScan A coronary calcium scan is an imaging test used to look for deposits of calcium and other fatty materials (plaques) in the inner lining of the blood vessels of the heart (coronary arteries). These deposits of calcium and plaques can partly clog and narrow the coronary arteries without producing any symptoms or warning signs. This puts a person at risk for a heart attack. This test can detect these deposits before symptoms develop. Tell a health care provider about:  Any allergies you have.  All medicines you are taking, including vitamins, herbs, eye drops, creams, and over-the-counter medicines.  Any problems you or family members have had with anesthetic medicines.  Any blood disorders you  have.  Any surgeries you have had.  Any medical conditions you have.  Whether you are pregnant or may be pregnant. What are the risks? Generally, this is a safe procedure. However, problems may occur, including:  Harm to a pregnant woman and her unborn baby. This test involves the use of radiation. Radiation exposure can be dangerous to a pregnant woman and her unborn baby. If you are pregnant, you generally should not have this procedure done.  Slight increase in the risk of cancer. This is because of the radiation involved in the test. What happens before the procedure? No preparation is needed for this procedure. What happens during the procedure?  You will undress and remove any jewelry around your neck or chest.  You will put on a hospital gown.  Sticky electrodes will be placed on your chest. The electrodes will be connected to an electrocardiogram (ECG) machine to record a tracing of the electrical activity of your heart.  A CT scanner will take pictures of your heart. During this time, you will be asked to lie still and hold your breath for 2-3 seconds while a picture of your heart is being taken. The procedure may vary among health care providers and hospitals. What happens after the procedure?  You can get dressed.  You can return to your normal activities.  It is up to you to get the results of your test. Ask your health care provider, or the department that is doing the test, when your results will be ready. Summary  A coronary calcium scan is an imaging test used to look for deposits  of calcium and other fatty materials (plaques) in the inner lining of the blood vessels of the heart (coronary arteries).  Generally, this is a safe procedure. Tell your health care provider if you are pregnant or may be pregnant.  No preparation is needed for this procedure.  A CT scanner will take pictures of your heart.  You can return to your normal activities after the scan is  done. This information is not intended to replace advice given to you by your health care provider. Make sure you discuss any questions you have with your health care provider. Document Released: 10/18/2007 Document Revised: 03/10/2016 Document Reviewed: 03/10/2016 Elsevier Interactive Patient Education  2017 [place]: At [facility], you and your health needs are our priority.  As part of our continuing mission to provide you with exceptional heart care, we have created designated Provider Care Teams.  These Care Teams include your primary Cardiologist (physician) and Advanced Practice Providers (APPs -  Physician Assistants and Nurse Practitioners) who all work together to provide you with the care you need, when you need it.  We recommend signing up for the patient portal called "MyChart".  Sign up information is provided on this After Visit Summary.  MyChart is used to connect with patients for Virtual Visits (Telemedicine).  Patients are able to view lab/test results, encounter notes, upcoming appointments, etc.  Non-urgent messages can be sent to your provider as well.   To learn more about what you can do with MyChart, go to [email or web address].    Your next appointment:   2 month(s)  The format for your next appointment:   In Person  Provider:   Oswaldo Milian, MD   Other Instructions Please check your blood pressure at home daily, write it down.  Call the office of send message via Mychart with the readings in 2 weeks for Dr. Gardiner Rhyme to review.

## 2019-10-13 NOTE — Progress Notes (Signed)
Cardiology Office Note:    Date:  10/14/2019   ID:  Meghan [name], DOB 1945/09/20, MRN [id number]  PCP:  Hulan Fess, MD  Cardiologist:  No primary care provider on file.  Electrophysiologist:  None   Referring MD: Hulan Fess, MD   Chief Complaint  Patient presents with  . Chest Pain    History of Present Illness:    Meghan [name] is a 74 y.o. female with a hx of breast cancer, esophageal stricture who is referred by Dr. Rex Kras for evaluation of chest pain.  She reports that she has been having intermittent chest pain.  Reports that it occurs every day, can last up to 30 minutes.  Can occur with exertion, but she states she has not been exercising due to back pain.  States that over the last 2 weeks she has tried to walk on the treadmill 1-2 times, but does walk for less than 10 minutes.  Felt short of breath when she tried to do this.  States that she feels her shortness of breath has worsened over the last 6 weeks.  Walking up hills causes her to be short of breath.  Reports BP has been elevated up to 150s at home.  No smoking history.  Family history includes father had MI in 59s.   Past Medical History:  Diagnosis Date  . Arthritis   . 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 Colon polyps    Hyperplastic   . Diverticulosis of colon (without mention of hemorrhage)   . Esophageal stricture   . GERD (gastroesophageal reflux disease)   . History of radiation therapy   . History of urinary tract infection   . Neuromuscular disorder ([place])    peripheral nueropathy  . Numbness    feet bilat at nighttime  . Personal history of radiation therapy 2006  . Restless leg syndrome 03/27/2014  . Restless legs syndrome (RLS)   . Toe pain, right 09/2014   for 2 wks  . Urinary urgency     Past Surgical History:  Procedure Laterality Date  . BREAST LUMPECTOMY Left 2006  . BREAST SURGERY     left 2006  . CESAREAN SECTION    . ORIF ANKLE  FRACTURE Left 08/24/2015   Procedure: OPEN REDUCTION INTERNAL FIXATION (ORIF) LEFT ANKLE FRACTURE;  Surgeon: Mcarthur Rossetti, MD;  Location: [facility];  Service: Orthopedics;  Laterality: Left;  General with a popliteal block    Current Medications: Current Meds  Medication Sig  . Cholecalciferol (VITAMIN D3) 2000 units TABS Take 2,000 Units by mouth daily.   . clidinium-chlordiazePOXIDE (LIBRAX) 5-2.5 MG capsule Take 1 capsule by mouth as needed.  . Cranberry-Vitamin C-Probiotic (AZO CRANBERRY) 250-30 MG TABS Take 1 tablet by mouth 2 (two) times daily.   Marland Kitchen FIBER SELECT GUMMIES PO Take 2 tablets by mouth at bedtime.   . fluticasone (FLONASE) 50 MCG/ACT nasal spray Place 1 spray into both nostrils daily as needed for allergies or rhinitis.   Marland Kitchen gabapentin (NEURONTIN) 300 MG capsule Take 2 capsules (600 mg total) by mouth at bedtime.  . Glucosamine HCl (GLUCOSAMINE PO) Take by mouth.  . Multiple Vitamin (MULTIVITAMIN) tablet Take 1 tablet by mouth every evening.   . Omega-3 Fatty Acids (FISH OIL) 1200 MG CAPS Take 1,200 mg by mouth daily.   . pantoprazole (PROTONIX) 40 MG tablet Take 1 tablet (40 mg total) by mouth daily.  . pramipexole (MIRAPEX) 0.5 MG  tablet Take 1 tablet (0.5 mg total) by mouth at bedtime.     Allergies:   Flagyl [metronidazole], Nsaids, and Tetracycline hcl   Social History   Socioeconomic History  . Marital status: Married    Spouse name: Not on file  . Number of children: 2  . Years of education: AS  . Highest education level: Not on file  Occupational History  . Occupation: Programmer, multimedia: [employer]  . Occupation: Retired  Tobacco Use  . Smoking status: Never Smoker  . Smokeless tobacco: Never Used  Vaping Use  . Vaping Use: Never used  Substance and Sexual Activity  . Alcohol use: Yes    Alcohol/week: 0.0 standard drinks    Comment: very rarely - wine  . Drug use: No  . Sexual activity: Yes  Other Topics Concern  . Not on file    Social History Narrative   Lives at home w/ her husband   Right-handed   Occasionally drinks tea   Social Determinants of Health   Financial Resource Strain:   . Difficulty of Paying Living Expenses:   Food Insecurity:   . Worried About Charity fundraiser in the Last Year:   . Arboriculturist in the Last Year:   Transportation Needs:   . Film/video editor (Medical):   Marland Kitchen Lack of Transportation (Non-Medical):   Physical Activity:   . Days of Exercise per Week:   . Minutes of Exercise per Session:   Stress:   . Feeling of Stress :   Social Connections:   . Frequency of Communication with Friends and Family:   . Frequency of Social Gatherings with Friends and Family:   . Attends Religious Services:   . Active Member of Clubs or Organizations:   . Attends Archivist Meetings:   Marland Kitchen Marital Status:      Family History:  family history includes Alzheimer's disease in her maternal aunt; Breast cancer in her maternal aunt and maternal aunt; Breast cancer (age of onset: 89) in her sister; Breast cancer (age of onset: 67) in her cousin; COPD in her father; Congestive Heart Failure in her maternal grandmother; Emphysema in her father; Esophageal cancer in her cousin; Heart attack in her mother; Heart disease in her father, maternal grandmother, and paternal grandmother; Obesity in her brother; Ovarian cancer in her cousin; Squamous cell carcinoma in her maternal aunt. There is no history of Neuropathy or Thyroid disease.  ROS:   Please see the history of present illness.    All other systems reviewed and are negative.  EKGs/Labs/Other Studies Reviewed:    The following studies were reviewed today:   EKG:  EKG is  ordered today.  The ekg ordered today demonstrates normal sinus rhythm, rate 96, low voltage  Recent Labs: 10/13/2019: BUN 17; Creatinine, Ser 0.93; Potassium 3.7; Sodium 141  Recent Lipid Panel    Component Value Date/Time   CHOL 209 (H) 10/13/2019 1531    TRIG 206 (H) 10/13/2019 1531   HDL 57 10/13/2019 1531   CHOLHDL 3.7 10/13/2019 1531   LDLCALC 116 (H) 10/13/2019 1531    Physical Exam:    VS:  BP 140/76   Pulse 96   Temp (!) 96.3 F (35.7 C)   Ht '5\' 4"'$  (1.626 m)   Wt 188 lb 6.4 oz (85.5 kg)   SpO2 97%   BMI 32.34 kg/m     Wt Readings from Last 3 Encounters:  10/13/19 188 lb  6.4 oz (85.5 kg)  12/06/18 186 lb 9.6 oz (84.6 kg)  10/28/18 188 lb (85.3 kg)     GEN: in no acute distress HEENT: Normal NECK: No JVD; No carotid bruits LYMPHATICS: No lymphadenopathy CARDIAC: RRR, no murmurs, rubs, gallops RESPIRATORY:  Clear to auscultation without rales, wheezing or rhonchi  ABDOMEN: Soft, non-tender, non-distended MUSCULOSKELETAL:  No edema; No deformity  SKIN: Warm and dry NEUROLOGIC:  Alert and oriented x 3 PSYCHIATRIC:  Normal affect   ASSESSMENT:    1. DOE (dyspnea on exertion)   2. Lipid screening   3. Chest pain of uncertain etiology    PLAN:    Chest pain/dyspnea on exertion: Chest pain is atypical in description, but does have significant risk CAD factors (age, hypertension, family history, chest radiation). -Lexiscan Myoview -Echocardiogram  Hypertension: BP elevated, will start lisinopril 10 mg daily.  Check BMP in 1 week.  Asked patient to monitor BP daily at home for next 2 weeks and call with results  Cardiovascular risk assessment: No recent lipids, will check lipid panel.  Also check calcium score for risk stratification.  RTC in 2 months   Medication Adjustments/Labs and Tests Ordered: Current medicines are reviewed at length with the patient today.  Concerns regarding medicines are outlined above.  Orders Placed This Encounter  Procedures  . CT CARDIAC SCORING  . Basic metabolic panel  . Lipid panel  . MYOCARDIAL PERFUSION IMAGING  . EKG 12-Lead  . ECHOCARDIOGRAM COMPLETE   Meds ordered this encounter  Medications  . DISCONTD: lisinopril (ZESTRIL) 10 MG tablet    Sig: Take 1 tablet (10 mg  total) by mouth daily.    Dispense:  30 tablet    Refill:  0    Patient Instructions  Medication Instructions:  START lisinopril 10 mg daily  *If you need a refill on your cardiac medications before your next appointment, please call your pharmacy*   Lab Work: BMET, Lipid today  If you have labs (blood work) drawn today and your tests are completely normal, you will receive your results only by: Marland Kitchen MyChart Message (if you have MyChart) OR . A paper copy in the mail If you have any lab test that is abnormal or we need to change your treatment, we will call you to review the results.   Testing/Procedures: Your physician has requested that you have an echocardiogram. Echocardiography is a painless test that uses sound waves to create images of your heart. It provides your doctor with information about the size and shape of your heart and how well your heart's chambers and valves are working. This procedure takes approximately one hour. There are no restrictions for this procedure.  Your physician has requested that you have a lexiscan myoview. For further information please visit [email or web address]. Please follow instruction sheet, as given.  CT coronary calcium score. This test is done at [address]. This is $150 out of pocket.   Coronary CalciumScan A coronary calcium scan is an imaging test used to look for deposits of calcium and other fatty materials (plaques) in the inner lining of the blood vessels of the heart (coronary arteries). These deposits of calcium and plaques can partly clog and narrow the coronary arteries without producing any symptoms or warning signs. This puts a person at risk for a heart attack. This test can detect these deposits before symptoms develop. Tell a health care provider about:  Any allergies you have.  All medicines you are taking, including  vitamins, herbs, eye drops, creams, and over-the-counter medicines.  Any problems you  or family members have had with anesthetic medicines.  Any blood disorders you have.  Any surgeries you have had.  Any medical conditions you have.  Whether you are pregnant or may be pregnant. What are the risks? Generally, this is a safe procedure. However, problems may occur, including:  Harm to a pregnant woman and her unborn baby. This test involves the use of radiation. Radiation exposure can be dangerous to a pregnant woman and her unborn baby. If you are pregnant, you generally should not have this procedure done.  Slight increase in the risk of cancer. This is because of the radiation involved in the test. What happens before the procedure? No preparation is needed for this procedure. What happens during the procedure?  You will undress and remove any jewelry around your neck or chest.  You will put on a hospital gown.  Sticky electrodes will be placed on your chest. The electrodes will be connected to an electrocardiogram (ECG) machine to record a tracing of the electrical activity of your heart.  A CT scanner will take pictures of your heart. During this time, you will be asked to lie still and hold your breath for 2-3 seconds while a picture of your heart is being taken. The procedure may vary among health care providers and hospitals. What happens after the procedure?  You can get dressed.  You can return to your normal activities.  It is up to you to get the results of your test. Ask your health care provider, or the department that is doing the test, when your results will be ready. Summary  A coronary calcium scan is an imaging test used to look for deposits of calcium and other fatty materials (plaques) in the inner lining of the blood vessels of the heart (coronary arteries).  Generally, this is a safe procedure. Tell your health care provider if you are pregnant or may be pregnant.  No preparation is needed for this procedure.  A CT scanner will take  pictures of your heart.  You can return to your normal activities after the scan is done. This information is not intended to replace advice given to you by your health care provider. Make sure you discuss any questions you have with your health care provider. Document Released: 10/18/2007 Document Revised: 03/10/2016 Document Reviewed: 03/10/2016 Elsevier Interactive Patient Education  2017 [place]: At [facility], you and your health needs are our priority.  As part of our continuing mission to provide you with exceptional heart care, we have created designated Provider Care Teams.  These Care Teams include your primary Cardiologist (physician) and Advanced Practice Providers (APPs -  Physician Assistants and Nurse Practitioners) who all work together to provide you with the care you need, when you need it.  We recommend signing up for the patient portal called "MyChart".  Sign up information is provided on this After Visit Summary.  MyChart is used to connect with patients for Virtual Visits (Telemedicine).  Patients are able to view lab/test results, encounter notes, upcoming appointments, etc.  Non-urgent messages can be sent to your provider as well.   To learn more about what you can do with MyChart, go to [email or web address].    Your next appointment:   2 month(s)  The format for your next appointment:   In Person  Provider:   Oswaldo Milian, MD   Other Instructions Please check  your blood pressure at home daily, write it down.  Call the office of send message via Mychart with the readings in 2 weeks for Dr. Gardiner Rhyme to review.       Signed, Donato Heinz, MD  10/14/2019 9:15 PM    [facility]

## 2019-10-13 NOTE — Telephone Encounter (Signed)
This is Dr. Schumann's pt 

## 2019-10-14 LAB — BASIC METABOLIC PANEL
BUN/Creatinine Ratio: 18 (ref 12–28)
BUN: 17 mg/dL (ref 8–27)
CO2: 23 mmol/L (ref 20–29)
Calcium: 10.2 mg/dL (ref 8.7–10.3)
Chloride: 106 mmol/L (ref 96–106)
Creatinine, Ser: 0.93 mg/dL (ref 0.57–1.00)
GFR calc Af Amer: 70 mL/min/{1.73_m2} (ref >59)
GFR calc non Af Amer: 61 mL/min/{1.73_m2} (ref >59)
Glucose: 145 mg/dL — ABNORMAL HIGH (ref 65–99)
Potassium: 3.7 mmol/L (ref 3.5–5.2)
Sodium: 141 mmol/L (ref 134–144)

## 2019-10-14 LAB — LIPID PANEL
Chol/HDL Ratio: 3.7 ratio (ref 0.0–4.4)
Cholesterol, Total: 209 mg/dL — ABNORMAL HIGH (ref 100–199)
HDL: 57 mg/dL (ref >39)
LDL Chol Calc (NIH): 116 mg/dL — ABNORMAL HIGH (ref 0–99)
Triglycerides: 206 mg/dL — ABNORMAL HIGH (ref 0–149)
VLDL Cholesterol Cal: 36 mg/dL (ref 5–40)

## 2019-10-17 ENCOUNTER — Other Ambulatory Visit: Payer: Self-pay | Admitting: *Deleted

## 2019-10-17 DIAGNOSIS — I10 Essential (primary) hypertension: Secondary | ICD-10-CM

## 2019-10-17 MED ORDER — ROSUVASTATIN CALCIUM 10 MG PO TABS
10.0000 mg | ORAL_TABLET | Freq: Every day | ORAL | 3 refills | Status: DC
Start: 2019-10-17 — End: 2020-09-24

## 2019-10-19 ENCOUNTER — Telehealth (HOSPITAL_COMMUNITY): Payer: Self-pay

## 2019-10-19 NOTE — Telephone Encounter (Signed)
Encounter complete. 

## 2019-10-21 ENCOUNTER — Other Ambulatory Visit: Payer: Self-pay

## 2019-10-21 ENCOUNTER — Ambulatory Visit (HOSPITAL_COMMUNITY)
Admission: RE | Admit: 2019-10-21 | Discharge: 2019-10-21 | Disposition: A | Discharge status: Home / Self Care | Payer: Medicare Other | Source: Ambulatory Visit | Attending: Cardiovascular Disease | Admitting: Cardiovascular Disease

## 2019-10-21 DIAGNOSIS — R079 Chest pain, unspecified: Secondary | ICD-10-CM | POA: Diagnosis not present

## 2019-10-21 DIAGNOSIS — R0609 Other forms of dyspnea: Secondary | ICD-10-CM

## 2019-10-21 DIAGNOSIS — R0600 Dyspnea, unspecified: Secondary | ICD-10-CM | POA: Diagnosis not present

## 2019-10-21 LAB — MYOCARDIAL PERFUSION IMAGING
LV dias vol: 73 mL (ref 46–106)
LV sys vol: 23 mL
Peak HR: 101 {beats}/min
Rest HR: 75 {beats}/min
SDS: 4
SRS: 0
SSS: 4
TID: 1.05

## 2019-10-21 MED ORDER — TECHNETIUM TC 99M TETROFOSMIN IV KIT
10.1000 | PACK | Freq: Once | INTRAVENOUS | Status: AC | PRN
  Administered 2019-10-21: 10.1 via INTRAVENOUS
  Filled 2019-10-21: qty 11

## 2019-10-21 MED ORDER — TECHNETIUM TC 99M TETROFOSMIN IV KIT
30.2000 | PACK | Freq: Once | INTRAVENOUS | Status: AC | PRN
  Administered 2019-10-21: 30.2 via INTRAVENOUS
  Filled 2019-10-21: qty 31

## 2019-10-21 MED ORDER — REGADENOSON 0.4 MG/5ML IV SOLN
0.4000 mg | Freq: Once | INTRAVENOUS | Status: AC
  Administered 2019-10-21: 0.4 mg via INTRAVENOUS

## 2019-10-22 LAB — BASIC METABOLIC PANEL
BUN/Creatinine Ratio: 17 (ref 12–28)
BUN: 14 mg/dL (ref 8–27)
CO2: 21 mmol/L (ref 20–29)
Calcium: 10.1 mg/dL (ref 8.7–10.3)
Chloride: 108 mmol/L — ABNORMAL HIGH (ref 96–106)
Creatinine, Ser: 0.83 mg/dL (ref 0.57–1.00)
GFR calc Af Amer: 80 mL/min/{1.73_m2} (ref >59)
GFR calc non Af Amer: 70 mL/min/{1.73_m2} (ref >59)
Glucose: 99 mg/dL (ref 65–99)
Potassium: 4 mmol/L (ref 3.5–5.2)
Sodium: 144 mmol/L (ref 134–144)

## 2019-10-28 ENCOUNTER — Ambulatory Visit: Payer: Medicare Other | Admitting: Internal Medicine

## 2019-10-31 DIAGNOSIS — I10 Essential (primary) hypertension: Secondary | ICD-10-CM

## 2019-10-31 MED ORDER — LISINOPRIL 10 MG PO TABS: 20.0000 mg | ORAL_TABLET | Freq: Every day | ORAL | 3 refills | Status: DC

## 2019-11-03 ENCOUNTER — Ambulatory Visit: Payer: Medicare Other | Admitting: Internal Medicine

## 2019-11-09 ENCOUNTER — Ambulatory Visit (INDEPENDENT_AMBULATORY_CARE_PROVIDER_SITE_OTHER): Payer: Medicare Other | Admitting: Internal Medicine

## 2019-11-09 ENCOUNTER — Other Ambulatory Visit: Payer: Self-pay

## 2019-11-09 ENCOUNTER — Encounter: Payer: Self-pay | Admitting: Internal Medicine

## 2019-11-09 VITALS — BP 114/68 | HR 96 | Ht 64.0 in | Wt 192.8 lb

## 2019-11-09 DIAGNOSIS — K219 Gastro-esophageal reflux disease without esophagitis: Secondary | ICD-10-CM | POA: Diagnosis not present

## 2019-11-09 DIAGNOSIS — I10 Essential (primary) hypertension: Secondary | ICD-10-CM

## 2019-11-09 LAB — BASIC METABOLIC PANEL
BUN/Creatinine Ratio: 15 (ref 12–28)
BUN: 14 mg/dL (ref 8–27)
CO2: 19 mmol/L — ABNORMAL LOW (ref 20–29)
Calcium: 10.2 mg/dL (ref 8.7–10.3)
Chloride: 105 mmol/L (ref 96–106)
Creatinine, Ser: 0.94 mg/dL (ref 0.57–1.00)
GFR calc Af Amer: 69 mL/min/{1.73_m2} (ref >59)
GFR calc non Af Amer: 60 mL/min/{1.73_m2} (ref >59)
Glucose: 121 mg/dL — ABNORMAL HIGH (ref 65–99)
Potassium: 4.3 mmol/L (ref 3.5–5.2)
Sodium: 140 mmol/L (ref 134–144)

## 2019-11-09 MED ORDER — PANTOPRAZOLE SODIUM 40 MG PO TBEC: 40.0000 mg | DELAYED_RELEASE_TABLET | Freq: Every day | ORAL | 3 refills | Status: DC

## 2019-11-09 NOTE — Progress Notes (Signed)
HISTORY OF PRESENT ILLNESS:  Meghan [name] is a pleasant 74 y.o. 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 Sharlett Iles, who is followed in this office for management of chronic GERD complicated by peptic stricture requiring esophageal dilation remotely (2007).  She was last seen via telehealth medicine 06/29/2018.  See that dictation for details.  At that time she was stable with mild pill dysphagia and nocturnal breakthrough symptoms.  She has continued on pantoprazole 40 mg daily.  She presents today for follow-up as requested.  For the most part, she does well.  However, she will experience occasional nocturnal reflux which is quite severe.  She treats this with on-demand antacids.  She denies any significant dysphagia.  Tolerating medication well.  No lower GI complaints.  Last colonoscopy December 2021 revealed marked diverticulosis.  Follow-up in 10 years recommended.  Review of outside blood work from June 2021 shows unremarkable basic metabolic panel.  She has completed her Covid vaccination series  REVIEW OF SYSTEMS:  All non-GI ROS negative unless otherwise stated in HPI except for shortness of breath (currently being evaluated by cardiology)  Past Medical History:  Diagnosis Date  . Arthritis   . 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 Colon polyps    Hyperplastic   . Diverticulosis of colon (without mention of hemorrhage)   . Esophageal stricture   . GERD (gastroesophageal reflux disease)   . History of radiation therapy   . History of urinary tract infection   . Neuromuscular disorder ([place])    peripheral nueropathy  . Numbness    feet bilat at nighttime  . Personal history of radiation therapy 2006  . Restless leg syndrome 03/27/2014  . Restless legs syndrome (RLS)   . Toe pain, right 09/2014   for 2 wks  . Urinary urgency     Past Surgical History:  Procedure Laterality Date  . BREAST LUMPECTOMY Left 2006  . BREAST  SURGERY     left 2006  . CESAREAN SECTION    . ORIF ANKLE FRACTURE Left 08/24/2015   Procedure: OPEN REDUCTION INTERNAL FIXATION (ORIF) LEFT ANKLE FRACTURE;  Surgeon: Mcarthur Rossetti, MD;  Location: [facility];  Service: Orthopedics;  Laterality: Left;  General with a popliteal block    Social History [name]  reports that she has never smoked. She has never used smokeless tobacco. She reports current alcohol use. She reports that she does not use drugs.  family history includes Alzheimer's disease in her maternal aunt; Breast cancer in her maternal aunt and maternal aunt; Breast cancer (age of onset: 21) in her sister; Breast cancer (age of onset: 63) in her cousin; COPD in her father; Congestive Heart Failure in her maternal grandmother; Emphysema in her father; Esophageal cancer in her cousin; Heart attack in her mother; Heart disease in her father, maternal grandmother, and paternal grandmother; Obesity in her brother; Ovarian cancer in her cousin; Squamous cell carcinoma in her maternal aunt.  Allergies  Allergen Reactions  . Flagyl [Metronidazole]   . Nsaids Other (See Comments)    CKD - per PCP  . Tetracycline Hcl Other (See Comments)       PHYSICAL EXAMINATION: Vital signs: BP 114/68   Pulse 96   Ht _0  (1.626 m)   Wt 192 lb 12.8 oz (87.5 kg)   BMI 33.09 kg/m   Constitutional: generally well-appearing, no acute distress Psychiatric: alert and oriented x3, cooperative Eyes: extraocular movements  intact, anicteric, conjunctiva pink Mouth: oral pharynx moist, no lesions Neck: supple no lymphadenopathy Cardiovascular: heart regular rate and rhythm, no murmur Lungs: clear to auscultation bilaterally Abdomen: soft, nontender, nondistended, no obvious ascites, no peritoneal signs, normal bowel sounds, no organomegaly Rectal: Omitted Extremities: no clubbing, cyanosis, or lower extremity edema bilaterally Skin: no lesions on visible extremities Neuro: No focal  deficits.  Cranial nerves continued  ASSESSMENT:  1.  Chronic GERD.  Occasional nocturnal breakthrough as described.  Symptoms for the most part controlled on pantoprazole 40 mg daily. 2.  History of peptic stricture requiring esophageal dilation 2007.  Has had no recurrent dysphagia since 3.  Obesity 4.  Colonoscopy December 2011 severe diverticulosis.  No neoplasia   PLAN:  1.  Reflux precautions with attention to small meal sizes, elevation head of bed, avoiding eating close to bedtime, and weight loss.  We discussed this topic. 2.  Continue pantoprazole 40 mg daily.  Prescription renewed for 1 year.  Medication risks reviewed 3.  Routine office follow-up for management of GERD 1 year.  Contact the office in the interim for breakthrough symptoms or recurrent dysphagia 4.  Will be due for repeat screening colonoscopy around December 2021.  She is aware, as we discussed this issue.

## 2019-11-09 NOTE — Patient Instructions (Signed)
We have sent the following medications to your pharmacy for you to pick up at your convenience:  Prontonix

## 2019-11-11 ENCOUNTER — Ambulatory Visit
Admission: RE | Admit: 2019-11-11 | Discharge: 2019-11-11 | Disposition: A | Discharge status: Home / Self Care | Payer: Self-pay | Source: Ambulatory Visit | Attending: Cardiology | Admitting: Cardiology

## 2019-11-11 ENCOUNTER — Other Ambulatory Visit: Payer: Self-pay | Admitting: Family Medicine

## 2019-11-11 ENCOUNTER — Other Ambulatory Visit: Payer: Self-pay

## 2019-11-11 ENCOUNTER — Ambulatory Visit (HOSPITAL_COMMUNITY): Payer: Medicare Other | Attending: Cardiology

## 2019-11-11 DIAGNOSIS — R079 Chest pain, unspecified: Secondary | ICD-10-CM

## 2019-11-11 DIAGNOSIS — E049 Nontoxic goiter, unspecified: Secondary | ICD-10-CM

## 2019-11-11 DIAGNOSIS — R0609 Other forms of dyspnea: Secondary | ICD-10-CM

## 2019-11-11 DIAGNOSIS — R0600 Dyspnea, unspecified: Secondary | ICD-10-CM | POA: Insufficient documentation

## 2019-11-14 ENCOUNTER — Other Ambulatory Visit: Payer: Self-pay | Admitting: *Deleted

## 2019-11-14 DIAGNOSIS — IMO0001 Reserved for inherently not codable concepts without codable children: Secondary | ICD-10-CM

## 2019-11-18 DIAGNOSIS — G629 Polyneuropathy, unspecified: Secondary | ICD-10-CM | POA: Diagnosis not present

## 2019-11-18 DIAGNOSIS — G2581 Restless legs syndrome: Secondary | ICD-10-CM | POA: Diagnosis not present

## 2019-11-18 DIAGNOSIS — R7301 Impaired fasting glucose: Secondary | ICD-10-CM | POA: Diagnosis not present

## 2019-11-18 DIAGNOSIS — Z853 Personal history of malignant neoplasm of breast: Secondary | ICD-10-CM | POA: Diagnosis not present

## 2019-11-18 DIAGNOSIS — N1831 Chronic kidney disease, stage 3a: Secondary | ICD-10-CM | POA: Diagnosis not present

## 2019-11-18 DIAGNOSIS — Z8719 Personal history of other diseases of the digestive system: Secondary | ICD-10-CM | POA: Diagnosis not present

## 2019-11-18 DIAGNOSIS — E042 Nontoxic multinodular goiter: Secondary | ICD-10-CM | POA: Diagnosis not present

## 2019-11-18 DIAGNOSIS — M8580 Other specified disorders of bone density and structure, unspecified site: Secondary | ICD-10-CM | POA: Diagnosis not present

## 2019-11-18 DIAGNOSIS — Z13220 Encounter for screening for lipoid disorders: Secondary | ICD-10-CM | POA: Diagnosis not present

## 2019-11-18 DIAGNOSIS — Z86010 Personal history of colonic polyps: Secondary | ICD-10-CM | POA: Diagnosis not present

## 2019-11-18 DIAGNOSIS — Z0000 Encounter for general adult medical examination without abnormal findings: Secondary | ICD-10-CM | POA: Diagnosis not present

## 2019-11-18 DIAGNOSIS — Z1159 Encounter for screening for other viral diseases: Secondary | ICD-10-CM | POA: Diagnosis not present

## 2019-11-25 ENCOUNTER — Ambulatory Visit
Admission: RE | Admit: 2019-11-25 | Discharge: 2019-11-25 | Disposition: A | Discharge status: Home / Self Care | Payer: Medicare Other | Source: Ambulatory Visit | Attending: Family Medicine | Admitting: Family Medicine

## 2019-11-25 DIAGNOSIS — E049 Nontoxic goiter, unspecified: Secondary | ICD-10-CM

## 2019-11-25 DIAGNOSIS — E042 Nontoxic multinodular goiter: Secondary | ICD-10-CM | POA: Diagnosis not present

## 2019-12-02 ENCOUNTER — Ambulatory Visit
Admission: RE | Admit: 2019-12-02 | Discharge: 2019-12-02 | Disposition: A | Discharge status: Home / Self Care | Payer: Medicare Other | Source: Ambulatory Visit | Attending: Obstetrics & Gynecology | Admitting: Obstetrics & Gynecology

## 2019-12-02 ENCOUNTER — Other Ambulatory Visit: Payer: Self-pay | Admitting: Obstetrics & Gynecology

## 2019-12-02 ENCOUNTER — Other Ambulatory Visit: Payer: Self-pay

## 2019-12-02 DIAGNOSIS — N644 Mastodynia: Secondary | ICD-10-CM

## 2019-12-02 DIAGNOSIS — Z1231 Encounter for screening mammogram for malignant neoplasm of breast: Secondary | ICD-10-CM

## 2019-12-07 ENCOUNTER — Encounter: Payer: Self-pay | Admitting: Neurology

## 2019-12-07 ENCOUNTER — Other Ambulatory Visit: Payer: Self-pay

## 2019-12-07 ENCOUNTER — Ambulatory Visit (INDEPENDENT_AMBULATORY_CARE_PROVIDER_SITE_OTHER): Payer: Medicare Other | Admitting: Neurology

## 2019-12-07 VITALS — BP 111/66 | HR 99 | Ht 64.0 in | Wt 187.0 lb

## 2019-12-07 DIAGNOSIS — G2581 Restless legs syndrome: Secondary | ICD-10-CM | POA: Diagnosis not present

## 2019-12-07 MED ORDER — GABAPENTIN 300 MG PO CAPS: 600.0000 mg | ORAL_CAPSULE | Freq: Every day | ORAL | 4 refills | Status: DC

## 2019-12-07 MED ORDER — PRAMIPEXOLE DIHYDROCHLORIDE 0.5 MG PO TABS: 0.5000 mg | ORAL_TABLET | Freq: Every day | ORAL | 4 refills | Status: DC

## 2019-12-07 NOTE — Patient Instructions (Signed)
Continue current medications  See you back in 1 year or sooner if needed

## 2019-12-07 NOTE — Progress Notes (Signed)
PATIENT: Meghan [name] DOB: [birth year]  REASON FOR VISIT: follow up HISTORY FROM: patient  HISTORY OF PRESENT ILLNESS: Today 12/07/19  Meghan [name] is a 74 year old female with history of restless leg syndrome.  She remains on gabapentin and Mirapex.  She does well, as long as she takes her medications 1 hour before bed.  She is tolerating the medications well.  No recent falls.  She is working part-time at [employer], doing samples.  She is retired Marine scientist.  She has had some chest pain, shortness of breath.  Indicates a full cardiac work-up was unremarkable.  Presents today for evaluation unaccompanied.  HISTORY 12/06/2018 SS: Meghan [name] is a 74 year old female with history of restless leg syndrome.  She remains on gabapentin and Mirapex with good control of her symptoms.  She is tolerating the medication without side effect.  The medication works well, some nights are more restless than others. For the last new months her big toes may feel numb. This is more bothersome at night. She denies any falls. She denies diabetes. She sees primary care routinely, everything was going well. She will take Extra Strength Tylenol with benefit if her symptoms wake her in the middle of the night. She is retired a Marine scientist.  She was working part-time at [employer] prior to [employer].  She presents today for follow-up unaccompanied.   REVIEW OF SYSTEMS: Out of a complete 14 system review of symptoms, the patient complains only of the following symptoms, and all other reviewed systems are negative.  Restless leg symptoms  ALLERGIES: Allergies  Allergen Reactions  . Flagyl [Metronidazole]   . Nsaids Other (See Comments)    CKD - per PCP  . Tetracycline Hcl Other (See Comments)    HOME MEDICATIONS: Outpatient Medications Prior to Visit  Medication Sig Dispense Refill  . Cholecalciferol (VITAMIN D3) 2000 units TABS Take 2,000 Units by mouth daily.     . clidinium-chlordiazePOXIDE (LIBRAX) 5-2.5 MG capsule Take 1  capsule by mouth as needed. 30 capsule 5  . Cranberry-Vitamin C-Probiotic (AZO CRANBERRY) 250-30 MG TABS Take 1 tablet by mouth 2 (two) times daily.     Marland Kitchen FIBER SELECT GUMMIES PO Take 2 tablets by mouth at bedtime.     . fluticasone (FLONASE) 50 MCG/ACT nasal spray Place 1 spray into both nostrils daily as needed for allergies or rhinitis.     . Glucosamine HCl (GLUCOSAMINE PO) Take by mouth.    . Multiple Vitamin (MULTIVITAMIN) tablet Take 1 tablet by mouth every evening.     . Omega-3 Fatty Acids (FISH OIL) 1200 MG CAPS Take 1,200 mg by mouth daily.     . pantoprazole (PROTONIX) 40 MG tablet Take 1 tablet (40 mg total) by mouth daily. 90 tablet 3  . rosuvastatin (CRESTOR) 10 MG tablet Take 1 tablet (10 mg total) by mouth daily. 90 tablet 3  . gabapentin (NEURONTIN) 300 MG capsule Take 2 capsules (600 mg total) by mouth at bedtime. 180 capsule 3  . pramipexole (MIRAPEX) 0.5 MG tablet Take 1 tablet (0.5 mg total) by mouth at bedtime. 90 tablet 3  . lisinopril (ZESTRIL) 10 MG tablet Take 2 tablets (20 mg total) by mouth daily. 90 tablet 3  . TURMERIC PO Take 1 capsule by mouth 2 (two) times a day. 1000 mg     No facility-administered medications prior to visit.    PAST MEDICAL HISTORY: Past Medical History:  Diagnosis Date  . Arthritis   . 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 Colon polyps    Hyperplastic   . Diverticulosis of colon (without mention of hemorrhage)   . Esophageal stricture   . GERD (gastroesophageal reflux disease)   . History of radiation therapy   . History of urinary tract infection   . Neuromuscular disorder ([place])    peripheral nueropathy  . Numbness    feet bilat at nighttime  . Personal history of radiation therapy 2006  . Restless leg syndrome 03/27/2014  . Restless legs syndrome (RLS)   . Toe pain, right 09/2014   for 2 wks  . Urinary urgency     PAST SURGICAL HISTORY: Past Surgical History:  Procedure  Laterality Date  . BREAST LUMPECTOMY Left 2006  . BREAST SURGERY     left 2006  . CESAREAN SECTION    . ORIF ANKLE FRACTURE Left 08/24/2015   Procedure: OPEN REDUCTION INTERNAL FIXATION (ORIF) LEFT ANKLE FRACTURE;  Surgeon: Mcarthur Rossetti, MD;  Location: [facility];  Service: Orthopedics;  Laterality: Left;  General with a popliteal block    FAMILY HISTORY: Family History  Problem Relation Age of Onset  . COPD Father   . Heart disease Father   . Emphysema Father        smoker  . Breast cancer Sister 89       mastectomy; metastasis to liver and lung  . Heart disease Maternal Grandmother   . Congestive Heart Failure Maternal Grandmother   . Heart disease Paternal Grandmother   . Obesity Brother   . Heart attack Mother   . Breast cancer Maternal Aunt        dx. late 4s  . Alzheimer's disease Maternal Aunt   . Breast cancer Maternal Aunt        dx. 79s  . Squamous cell carcinoma Maternal Aunt        recently removed; dx. 95  . Breast cancer Cousin 62       genetic testing in approx 2015  . Ovarian cancer Cousin        dx. 80s  . Esophageal cancer Cousin        not a smoker  . Neuropathy Neg Hx   . Thyroid disease Neg Hx     SOCIAL HISTORY: Social History   Socioeconomic History  . Marital status: Married    Spouse name: Not on file  . Number of children: 2  . Years of education: AS  . Highest education level: Not on file  Occupational History  . Occupation: Programmer, multimedia: [employer]  . Occupation: Retired  Tobacco Use  . Smoking status: Never Smoker  . Smokeless tobacco: Never Used  Vaping Use  . Vaping Use: Never used  Substance and Sexual Activity  . Alcohol use: Yes    Alcohol/week: 0.0 standard drinks    Comment: very rarely - wine  . Drug use: No  . Sexual activity: Yes  Other Topics Concern  . Not on file  Social History Narrative   Lives at home w/ her husband   Right-handed   Occasionally drinks tea   Social Determinants of  Health   Financial Resource Strain:   . Difficulty of Paying Living Expenses:   Food Insecurity:   . Worried About Charity fundraiser in the Last Year:   . Arboriculturist in the Last Year:   Transportation Needs:   . Film/video editor (Medical):   Marland Kitchen Lack  of Transportation (Non-Medical):   Physical Activity:   . Days of Exercise per Week:   . Minutes of Exercise per Session:   Stress:   . Feeling of Stress :   Social Connections:   . Frequency of Communication with Friends and Family:   . Frequency of Social Gatherings with Friends and Family:   . Attends Religious Services:   . Active Member of Clubs or Organizations:   . Attends Archivist Meetings:   Marland Kitchen Marital Status:   Intimate Partner Violence:   . Fear of Current or Ex-Partner:   . Emotionally Abused:   Marland Kitchen Physically Abused:   . Sexually Abused:    PHYSICAL EXAM  Vitals:   12/07/19 0814  BP: 111/66  Pulse: 99  Weight: 187 lb (84.8 kg)  Height: 5' 4"  (1.626 m)   Body mass index is 32.1 kg/m.  Generalized: Well developed, in no acute distress   Neurological examination  Mentation: Alert oriented to time, place, history taking. Follows all commands speech and language fluent Cranial nerve II-XII: Pupils were equal round reactive to light. Extraocular movements were full, visual field were full on confrontational test. Facial sensation and strength were normal.  Head turning and shoulder shrug  were normal and symmetric. Motor: The motor testing reveals 5 over 5 strength of all 4 extremities. Good symmetric motor tone is noted throughout.  Sensory: Sensory testing is intact to soft touch on all 4 extremities. No evidence of extinction is noted.  Coordination: Cerebellar testing reveals good finger-nose-finger and heel-to-shin bilaterally.  Gait and station: Gait is slightly wide-based Reflexes: Deep tendon reflexes are symmetric and normal bilaterally.   DIAGNOSTIC DATA (LABS, IMAGING, TESTING) - I  reviewed patient records, labs, notes, testing and imaging myself where available.  Lab Results  Component Value Date   WBC 5.0 10/25/2015   HGB 13.4 10/25/2015   HCT 41.5 10/25/2015   MCV 91.6 10/25/2015   PLT 230 10/25/2015      Component Value Date/Time   NA 140 11/09/2019 0945   K 4.3 11/09/2019 0945   CL 105 11/09/2019 0945   CO2 19 (L) 11/09/2019 0945   GLUCOSE 121 (H) 11/09/2019 0945   GLUCOSE 122 (H) 08/22/2015 1020   BUN 14 11/09/2019 0945   CREATININE 0.94 11/09/2019 0945   CALCIUM 10.2 11/09/2019 0945   PROT 7.3 10/20/2013 0855   ALBUMIN 3.6 09/05/2009 1447   AST 31 09/05/2009 1447   ALT 29 09/05/2009 1447   ALKPHOS 44 09/05/2009 1447   BILITOT 0.5 09/05/2009 1447   GFRNONAA 60 11/09/2019 0945   GFRAA 69 11/09/2019 0945   Lab Results  Component Value Date   CHOL 209 (H) 10/13/2019   HDL 57 10/13/2019   LDLCALC 116 (H) 10/13/2019   TRIG 206 (H) 10/13/2019   CHOLHDL 3.7 10/13/2019   No results found for: HGBA1C No results found for: VITAMINB12 No results found for: TSH    ASSESSMENT AND PLAN 74 y.o. year old female  has a past medical history of Arthritis, Breast cancer ([place]) (2006), Colon polyps, Diverticulosis of colon (without mention of hemorrhage), Esophageal stricture, GERD (gastroesophageal reflux disease), History of radiation therapy, History of urinary tract infection, Neuromuscular disorder ([place]), Numbness, Personal history of radiation therapy (2006), Restless leg syndrome (03/27/2014), Restless legs syndrome (RLS), Toe pain, right (09/2014), and Urinary urgency. here with:  1.  Restless leg syndrome  Symptoms overall well controlled.  She will remain on Mirapex 0.5 mg at bedtime, gabapentin 600 mg  at bedtime.  I have sent refills.  She will follow-up in 1 year or sooner if needed.  I spent 20 minutes of face-to-face and non-face-to-face time with patient.  This included previsit chart review, lab review, study review, order entry, electronic  health record documentation, patient education.  Butler Denmark, AGNP-C, DNP 12/07/2019, 8:40 AM [facility] [address] [place], [place] [address] [phone]

## 2019-12-07 NOTE — Progress Notes (Signed)
I have read the note, and I agree with the clinical assessment and plan.  Roxy Mastandrea K Regana Kemple   

## 2019-12-09 DIAGNOSIS — R0600 Dyspnea, unspecified: Secondary | ICD-10-CM | POA: Diagnosis not present

## 2019-12-12 ENCOUNTER — Other Ambulatory Visit: Payer: Self-pay

## 2019-12-12 ENCOUNTER — Ambulatory Visit
Admission: RE | Admit: 2019-12-12 | Discharge: 2019-12-12 | Disposition: A | Discharge status: Home / Self Care | Payer: Medicare Other | Source: Ambulatory Visit | Attending: Obstetrics & Gynecology | Admitting: Obstetrics & Gynecology

## 2019-12-12 DIAGNOSIS — N6489 Other specified disorders of breast: Secondary | ICD-10-CM | POA: Diagnosis not present

## 2019-12-12 DIAGNOSIS — Z853 Personal history of malignant neoplasm of breast: Secondary | ICD-10-CM | POA: Diagnosis not present

## 2019-12-12 DIAGNOSIS — N644 Mastodynia: Secondary | ICD-10-CM

## 2019-12-12 DIAGNOSIS — R928 Other abnormal and inconclusive findings on diagnostic imaging of breast: Secondary | ICD-10-CM | POA: Diagnosis not present

## 2019-12-13 DIAGNOSIS — N958 Other specified menopausal and perimenopausal disorders: Secondary | ICD-10-CM | POA: Diagnosis not present

## 2019-12-13 DIAGNOSIS — M8588 Other specified disorders of bone density and structure, other site: Secondary | ICD-10-CM | POA: Diagnosis not present

## 2019-12-13 DIAGNOSIS — Z124 Encounter for screening for malignant neoplasm of cervix: Secondary | ICD-10-CM | POA: Diagnosis not present

## 2019-12-13 DIAGNOSIS — Z6833 Body mass index (BMI) 33.0-33.9, adult: Secondary | ICD-10-CM | POA: Diagnosis not present

## 2019-12-20 ENCOUNTER — Other Ambulatory Visit: Payer: Self-pay | Admitting: Cardiology

## 2019-12-20 MED ORDER — LISINOPRIL 10 MG PO TABS: 20.0000 mg | ORAL_TABLET | Freq: Every day | ORAL | 3 refills | Status: DC

## 2019-12-20 NOTE — Telephone Encounter (Signed)
*  STAT* If patient is at the pharmacy, call can be transferred to refill team.   1. Which medications need to be refilled? (please list name of each medication and dose if known) lisinopril  2. Which pharmacy/location (including street and city if local pharmacy) [facility]   3. Do they need a 30 day or 90 day supply? Meghan [name]

## 2020-01-01 NOTE — Progress Notes (Signed)
Cardiology Office Note:    Date:  01/03/2020   ID:  Meghan [name], DOB 07/03/1945, MRN [id number]  PCP:  Hulan Fess, MD  Cardiologist:  No primary care provider on file.  Electrophysiologist:  None   Referring MD: Hulan Fess, MD   Chief Complaint  Patient presents with  . Shortness of Breath    History of Present Illness:    Meghan [name] is a 74 y.o. female with a hx of breast cancer, esophageal stricture who presents for follow-up. She was referred by Dr. Rex Kras for evaluation of chest pain, initially seen on 10/13/2019. She reports that she has been having intermittent chest pain.  Reports that it occurs every day, can last up to 30 minutes.  Can occur with exertion, but she states she has not been exercising due to back pain.  States that over the last 2 weeks she has tried to walk on the treadmill 1-2 times, but does walk for less than 10 minutes.  Felt short of breath when she tried to do this.  States that she feels her shortness of breath has worsened over the last 6 weeks.  Walking up hills causes her to be short of breath.  Reports BP has been elevated up to 150s at home.  No smoking history.  Family history includes father had MI in 97s.  Lexiscan Myoview on 10/21/2019 showed normal perfusion, EF 68%. Echocardiogram on 11/11/2019 showed LVEF 65 to 70%, moderate LVH, grade 1 diastolic dysfunction, normal RV function, mild AI. Calcium score on 11/11/2019 was 57 (54th percentile); also notable for small pulmonary nodules.  Since last clinic visit, she reports that she is doing well.  Denies any further chest pain.  Reports her dyspnea has improved.  She has started walking twice per week, yesterday walked for 55 minutes.  She denies any exertional symptoms.  Reports has been checking BP at home, has been 120s to 130s.   Past Medical History:  Diagnosis Date  . Arthritis   . 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 Colon polyps     Hyperplastic   . Diverticulosis of colon (without mention of hemorrhage)   . Esophageal stricture   . GERD (gastroesophageal reflux disease)   . History of radiation therapy   . History of urinary tract infection   . Neuromuscular disorder ([place])    peripheral nueropathy  . Numbness    feet bilat at nighttime  . Personal history of radiation therapy 2006  . Restless leg syndrome 03/27/2014  . Restless legs syndrome (RLS)   . Toe pain, right 09/2014   for 2 wks  . Urinary urgency     Past Surgical History:  Procedure Laterality Date  . BREAST LUMPECTOMY Left 2006  . BREAST SURGERY     left 2006  . CESAREAN SECTION    . ORIF ANKLE FRACTURE Left 08/24/2015   Procedure: OPEN REDUCTION INTERNAL FIXATION (ORIF) LEFT ANKLE FRACTURE;  Surgeon: Mcarthur Rossetti, MD;  [facility];  Service: Orthopedics;  Laterality: Left;  General with a popliteal block    Current Medications: Current Meds  Medication Sig  . Cholecalciferol (VITAMIN D3) 2000 units TABS Take 2,000 Units by mouth daily.   . clidinium-chlordiazePOXIDE (LIBRAX) 5-2.5 MG capsule Take 1 capsule by mouth as needed.  . Cranberry-Vitamin C-Probiotic (AZO CRANBERRY) 250-30 MG TABS Take 1 tablet by mouth 2 (two) times daily.   Marland Kitchen FIBER SELECT GUMMIES PO Take 2  tablets by mouth at bedtime.   . fluticasone (FLONASE) 50 MCG/ACT nasal spray Place 1 spray into both nostrils daily as needed for allergies or rhinitis.   Marland Kitchen gabapentin (NEURONTIN) 300 MG capsule Take 2 capsules (600 mg total) by mouth at bedtime.  . Glucosamine HCl (GLUCOSAMINE PO) Take by mouth.  . Multiple Vitamin (MULTIVITAMIN) tablet Take 1 tablet by mouth every evening.   . Omega-3 Fatty Acids (FISH OIL) 1200 MG CAPS Take 1,200 mg by mouth daily.   . pantoprazole (PROTONIX) 40 MG tablet Take 1 tablet (40 mg total) by mouth daily.  . pramipexole (MIRAPEX) 0.5 MG tablet Take 1 tablet (0.5 mg total) by mouth at bedtime.  . rosuvastatin (CRESTOR) 10 MG tablet Take  1 tablet (10 mg total) by mouth daily.  . TURMERIC PO Take 1 capsule by mouth 2 (two) times a day. 1000 mg     Allergies:   Flagyl [metronidazole], Nsaids, and Tetracycline hcl   Social History   Socioeconomic History  . Marital status: Married    Spouse name: Not on file  . Number of children: 2  . Years of education: AS  . Highest education level: Not on file  Occupational History  . 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 Smoking status: Never Smoker  . Smokeless tobacco: Never Used  Vaping Use  . Vaping Use: Never used  Substance and Sexual Activity  . Alcohol use: Yes    Alcohol/week: 0.0 standard drinks    Comment: very rarely - wine  . Drug use: No  . Sexual activity: Yes  Other Topics Concern  . Not on file  Social History Narrative   Lives at home w/ her husband   Right-handed   Occasionally drinks tea   Social Determinants of Health   Financial Resource Strain:   . Difficulty of Paying Living Expenses: Not on file  Food Insecurity:   . Worried About Charity fundraiser in the Last Year: Not on file  . Ran Out of Food in the Last Year: Not on file  Transportation Needs:   . Lack of Transportation (Medical): Not on file  . Lack of Transportation (Non-Medical): Not on file  Physical Activity:   . Days of Exercise per Week: Not on file  . Minutes of Exercise per Session: Not on file  Stress:   . Feeling of Stress : Not on file  Social Connections:   . Frequency of Communication with Friends and Family: Not on file  . Frequency of Social Gatherings with Friends and Family: Not on file  . Attends Religious Services: Not on file  . Active Member of Clubs or Organizations: Not on file  . Attends Archivist Meetings: Not on file  . Marital Status: Not on file     Family History:  family history includes Alzheimer's disease in her maternal aunt; Breast cancer in her maternal aunt and maternal aunt;  Breast cancer (age of onset: 54) in her sister; Breast cancer (age of onset: 2) in her cousin; COPD in her father; Congestive Heart Failure in her maternal grandmother; Emphysema in her father; Esophageal cancer in her cousin; Heart attack in her mother; Heart disease in her father, maternal grandmother, and paternal grandmother; Obesity in her brother; Ovarian cancer in her cousin; Squamous cell carcinoma in her maternal aunt. There is no history of Neuropathy or Thyroid disease.  ROS:   Please see the history of  present illness.    All other systems reviewed and are negative.  EKGs/Labs/Other Studies Reviewed:    The following studies were reviewed today:   EKG:  EKG is  ordered today.  The ekg ordered today demonstrates normal sinus rhythm, rate 96, low voltage  Recent Labs: 11/09/2019: BUN 14; Creatinine, Ser 0.94; Potassium 4.3; Sodium 140  Recent Lipid Panel    Component Value Date/Time   CHOL 209 (H) 10/13/2019 1531   TRIG 206 (H) 10/13/2019 1531   HDL 57 10/13/2019 1531   CHOLHDL 3.7 10/13/2019 1531   LDLCALC 116 (H) 10/13/2019 1531    Physical Exam:    VS:  BP 124/62   Pulse 85   Ht $R'5\' 4"'Zk$  (1.626 m)   Wt 186 lb (84.4 kg)   SpO2 98%   BMI 31.93 kg/m     Wt Readings from Last 3 Encounters:  01/03/20 186 lb (84.4 kg)  12/07/19 187 lb (84.8 kg)  11/09/19 192 lb 12.8 oz (87.5 kg)     GEN: in no acute distress HEENT: Normal NECK: No JVD; No carotid bruits LYMPHATICS: No lymphadenopathy CARDIAC: RRR, no murmurs, rubs, gallops RESPIRATORY:  Clear to auscultation without rales, wheezing or rhonchi  ABDOMEN: Soft, non-tender, non-distended MUSCULOSKELETAL:  No edema; No deformity  SKIN: Warm and dry NEUROLOGIC:  Alert and oriented x 3 PSYCHIATRIC:  Normal affect   ASSESSMENT:    1. DOE (dyspnea on exertion)   2. Hyperlipidemia, unspecified hyperlipidemia type   3. Chest pain of uncertain etiology   4. Essential hypertension   5. Lung nodule < 6cm on CT     PLAN:    Chest pain/dyspnea on exertion: Chest pain is atypical in description.  Lexiscan Myoview on 10/21/2019 showed normal perfusion, EF 68%. Echocardiogram on 11/11/2019 showed LVEF 65 to 70%, moderate LVH, grade 1 diastolic dysfunction, normal RV function, mild AI. Calcium score on 11/11/2019 was 57 (54th percentile).  Symptoms improving, no further cardiac work-up recommended.  Hypertension: On lisinopril 20 mg daily.  Appears controlled  Hyperlipidemia: LDL 116 on 10/13/2019. 10-year ASCVD risk score 17%. Started rosuvastatin 10 mg daily.  Will recheck lipid panel  Pulmonary nodules: CT calcium score showed small pulmonary nodules. Likely benign but given her breast cancer history, will plan repeat CT chest in 1 year  RTC in 1 year   Medication Adjustments/Labs and Tests Ordered: Current medicines are reviewed at length with the patient today.  Concerns regarding medicines are outlined above.  Orders Placed This Encounter  Procedures  . Lipid panel   No orders of the defined types were placed in this encounter.   Patient Instructions  Medication Instructions:  Your physician recommends that you continue on your current medications as directed. Please refer to the Current Medication list given to you today.  *If you need a refill on your cardiac medications before your next appointment, please call your pharmacy*  Lab Work: Return for FASTING labs (Lipid panel)  If you have labs (blood work) drawn today and your tests are completely normal, you will receive your results only by: Marland Kitchen MyChart Message (if you have MyChart) OR . A paper copy in the mail If you have any lab test that is abnormal or we need to change your treatment, we will call you to review the results.  Follow-Up: At [facility], you and your health needs are our priority.  As part of our continuing mission to provide you with exceptional heart care, we have created designated Provider Care  Teams.  These Care  Teams include your primary Cardiologist (physician) and Advanced Practice Providers (APPs -  Physician Assistants and Nurse Practitioners) who all work together to provide you with the care you need, when you need it.  We recommend signing up for the patient portal called "MyChart".  Sign up information is provided on this After Visit Summary.  MyChart is used to connect with patients for Virtual Visits (Telemedicine).  Patients are able to view lab/test results, encounter notes, upcoming appointments, etc.  Non-urgent messages can be sent to your provider as well.   To learn more about what you can do with MyChart, go to [email or web address].    Your next appointment:   12 month(s)  The format for your next appointment:   In Person  Provider:   Oswaldo Milian, MD       Signed, Donato Heinz, MD  01/03/2020 6:53 PM    [place]

## 2020-01-03 ENCOUNTER — Encounter: Payer: Self-pay | Admitting: Cardiology

## 2020-01-03 ENCOUNTER — Other Ambulatory Visit: Payer: Self-pay

## 2020-01-03 ENCOUNTER — Ambulatory Visit (INDEPENDENT_AMBULATORY_CARE_PROVIDER_SITE_OTHER): Payer: Medicare Other | Admitting: Cardiology

## 2020-01-03 VITALS — BP 124/62 | HR 85 | Ht 64.0 in | Wt 186.0 lb

## 2020-01-03 DIAGNOSIS — I10 Essential (primary) hypertension: Secondary | ICD-10-CM | POA: Diagnosis not present

## 2020-01-03 DIAGNOSIS — E785 Hyperlipidemia, unspecified: Secondary | ICD-10-CM | POA: Diagnosis not present

## 2020-01-03 DIAGNOSIS — R079 Chest pain, unspecified: Secondary | ICD-10-CM

## 2020-01-03 DIAGNOSIS — R911 Solitary pulmonary nodule: Secondary | ICD-10-CM

## 2020-01-03 DIAGNOSIS — IMO0001 Reserved for inherently not codable concepts without codable children: Secondary | ICD-10-CM

## 2020-01-03 DIAGNOSIS — R0609 Other forms of dyspnea: Secondary | ICD-10-CM

## 2020-01-03 DIAGNOSIS — R0600 Dyspnea, unspecified: Secondary | ICD-10-CM

## 2020-01-03 NOTE — Patient Instructions (Signed)
Medication Instructions:  Your physician recommends that you continue on your current medications as directed. Please refer to the Current Medication list given to you today.  *If you need a refill on your cardiac medications before your next appointment, please call your pharmacy*  Lab Work: Return for FASTING labs (Lipid panel)  If you have labs (blood work) drawn today and your tests are completely normal, you will receive your results only by: Marland Kitchen MyChart Message (if you have MyChart) OR . A paper copy in the mail If you have any lab test that is abnormal or we need to change your treatment, we will call you to review the results.  Follow-Up: At [facility], you and your health needs are our priority.  As part of our continuing mission to provide you with exceptional heart care, we have created designated Provider Care Teams.  These Care Teams include your primary Cardiologist (physician) and Advanced Practice Providers (APPs -  Physician Assistants and Nurse Practitioners) who all work together to provide you with the care you need, when you need it.  We recommend signing up for the patient portal called "MyChart".  Sign up information is provided on this After Visit Summary.  MyChart is used to connect with patients for Virtual Visits (Telemedicine).  Patients are able to view lab/test results, encounter notes, upcoming appointments, etc.  Non-urgent messages can be sent to your provider as well.   To learn more about what you can do with MyChart, go to [email or web address].    Your next appointment:   12 month(s)  The format for your next appointment:   In Person  Provider:   Oswaldo Milian, MD

## 2020-01-13 DIAGNOSIS — E785 Hyperlipidemia, unspecified: Secondary | ICD-10-CM | POA: Diagnosis not present

## 2020-01-13 LAB — LIPID PANEL
Chol/HDL Ratio: 2.4 ratio (ref 0.0–4.4)
Cholesterol, Total: 119 mg/dL (ref 100–199)
HDL: 49 mg/dL (ref >39)
LDL Chol Calc (NIH): 49 mg/dL (ref 0–99)
Triglycerides: 117 mg/dL (ref 0–149)
VLDL Cholesterol Cal: 21 mg/dL (ref 5–40)

## 2020-01-19 DIAGNOSIS — S91331A Puncture wound without foreign body, right foot, initial encounter: Secondary | ICD-10-CM | POA: Diagnosis not present

## 2020-02-27 DIAGNOSIS — M5116 Intervertebral disc disorders with radiculopathy, lumbar region: Secondary | ICD-10-CM | POA: Diagnosis not present

## 2020-02-27 DIAGNOSIS — M5416 Radiculopathy, lumbar region: Secondary | ICD-10-CM | POA: Diagnosis not present

## 2020-04-18 DIAGNOSIS — H52203 Unspecified astigmatism, bilateral: Secondary | ICD-10-CM | POA: Diagnosis not present

## 2020-04-18 DIAGNOSIS — H2513 Age-related nuclear cataract, bilateral: Secondary | ICD-10-CM | POA: Diagnosis not present

## 2020-05-11 DIAGNOSIS — M4316 Spondylolisthesis, lumbar region: Secondary | ICD-10-CM | POA: Diagnosis not present

## 2020-05-11 DIAGNOSIS — Z6832 Body mass index (BMI) 32.0-32.9, adult: Secondary | ICD-10-CM | POA: Diagnosis not present

## 2020-05-11 DIAGNOSIS — I10 Essential (primary) hypertension: Secondary | ICD-10-CM | POA: Insufficient documentation

## 2020-05-23 ENCOUNTER — Other Ambulatory Visit: Payer: Self-pay

## 2020-05-23 ENCOUNTER — Emergency Department (HOSPITAL_BASED_OUTPATIENT_CLINIC_OR_DEPARTMENT_OTHER)
Admission: EM | Admit: 2020-05-23 | Discharge: 2020-05-23 | Disposition: A | Discharge status: Home / Self Care | Payer: Medicare Other | Attending: Emergency Medicine | Admitting: Emergency Medicine

## 2020-05-23 ENCOUNTER — Encounter (HOSPITAL_BASED_OUTPATIENT_CLINIC_OR_DEPARTMENT_OTHER): Payer: Self-pay

## 2020-05-23 DIAGNOSIS — T7840XA Allergy, unspecified, initial encounter: Secondary | ICD-10-CM | POA: Diagnosis not present

## 2020-05-23 DIAGNOSIS — R220 Localized swelling, mass and lump, head: Secondary | ICD-10-CM | POA: Diagnosis present

## 2020-05-23 DIAGNOSIS — T783XXA Angioneurotic edema, initial encounter: Secondary | ICD-10-CM | POA: Diagnosis not present

## 2020-05-23 DIAGNOSIS — Z853 Personal history of malignant neoplasm of breast: Secondary | ICD-10-CM | POA: Diagnosis not present

## 2020-05-23 MED ORDER — DEXAMETHASONE 6 MG PO TABS
6.0000 mg | ORAL_TABLET | Freq: Once | ORAL | Status: AC
  Administered 2020-05-23: 6 mg via ORAL
  Filled 2020-05-23: qty 1

## 2020-05-23 NOTE — ED Triage Notes (Signed)
Pt reports having allergic reaction with a "lump" in her throat, facial swelling and upper body rash that started last night

## 2020-05-23 NOTE — ED Provider Notes (Signed)
[facility] EMERGENC[facility] DEPARTMENT Provider Note   CSN: [id number] Arrival date & time: 05/23/20  4401     History Chief Complaint  Patient presents with  . Allergic Reaction    Meghan [name] is a 75 y.o. female.  HPI Patient presents with facial and lip swelling.  Began last night.  Feels a little fullness in her throat.  No known exposure.  Has not had allergic reaction like this but has had seasonal allergies at time.  No new food or no new medications.  Is on lisinopril.  States she had trouble sleeping last night due to the itching on her chest.  Feels it is really not worsened today but has not improved.  No difficulty breathing but states there is a fullness in her throat.  No wheezing. Took some Benadryl yesterday and some again this morning.    Past Medical History:  Diagnosis Date  . Arthritis   . 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 Colon polyps    Hyperplastic   . Diverticulosis of colon (without mention of hemorrhage)   . Esophageal stricture   . GERD (gastroesophageal reflux disease)   . History of radiation therapy   . History of urinary tract infection   . Neuromuscular disorder ([place])    peripheral nueropathy  . Numbness    feet bilat at nighttime  . Personal history of radiation therapy 2006  . Restless leg syndrome 03/27/2014  . Restless legs syndrome (RLS)   . Toe pain, right 09/2014   for 2 wks  . Urinary urgency     Patient Active Problem List   Diagnosis Date Noted  . Solitary pulmonary nodule 10/12/2015  . Closed trimalleolar fracture of left ankle 08/24/2015  . Ankle fracture, left 08/24/2015  . Trimalleolar fracture of left ankle 08/24/2015  . Family history of breast cancer in female 12/28/2014  . Family history of ovarian cancer 12/28/2014  . Genetic testing 12/07/2014  . Restless leg syndrome 03/27/2014  . Disturbance of skin sensation 10/20/2013  . ADENOCARCINOMA, BREAST, LEFT  04/10/2008  . ANXIETY 04/10/2008  . 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 GERD 04/10/2008  . DIVERTICULOSIS, COLON 04/10/2008  . ARTHRITIS 04/10/2008    Past Surgical History:  Procedure Laterality Date  . BREAST LUMPECTOMY Left 2006  . BREAST SURGERY     left 2006  . CESAREAN SECTION    . ORIF ANKLE FRACTURE Left 08/24/2015   Procedure: OPEN REDUCTION INTERNAL FIXATION (ORIF) LEFT ANKLE FRACTURE;  Surgeon: Mcarthur Rossetti, MD;  Location: [facility];  Service: Orthopedics;  Laterality: Left;  General with a popliteal block     OB History   No obstetric history on file.     Family History  Problem Relation Age of Onset  . COPD Father   . Heart disease Father   . Emphysema Father        smoker  . Breast cancer Sister 42       mastectomy; metastasis to liver and lung  . Heart disease Maternal Grandmother   . Congestive Heart Failure Maternal Grandmother   . Heart disease Paternal Grandmother   . Obesity Brother   . Heart attack Mother   . Breast cancer Maternal Aunt        dx. late 51s  . Alzheimer's disease Maternal Aunt   . Breast cancer Maternal Aunt        dx. 66s  . Squamous cell carcinoma Maternal  Aunt        recently removed; dx. 95  . Breast cancer Cousin 62       genetic testing in approx 2015  . Ovarian cancer Cousin        dx. 69s  . Esophageal cancer Cousin        not a smoker  . Neuropathy Neg Hx   . Thyroid disease Neg Hx     Social History   Tobacco Use  . Smoking status: Never Smoker  . Smokeless tobacco: Never Used  Vaping Use  . Vaping Use: Never used  Substance Use Topics  . Alcohol use: Yes    Alcohol/week: 0.0 standard drinks    Comment: very rarely - wine  . Drug use: No    Home Medications Prior to Admission medications   Medication Sig Start Date End Date Taking? Authorizing Provider  Cholecalciferol (VITAMIN D3) 2000 units TABS Take 2,000 Units by mouth daily.     [provider]  clidinium-chlordiazePOXIDE (LIBRAX) 5-2.5  MG capsule Take 1 capsule by mouth as needed. 08/04/19 11/28/20  Irene Shipper, MD  Cranberry-Vitamin C-Probiotic (AZO CRANBERRY) 250-30 MG TABS Take 1 tablet by mouth 2 (two) times daily.     [provider]  FIBER SELECT GUMMIES PO Take 2 tablets by mouth at bedtime.     [provider]  fluticasone (FLONASE) 50 MCG/ACT nasal spray Place 1 spray into both nostrils daily as needed for allergies or rhinitis.     [provider]  gabapentin (NEURONTIN) 300 MG capsule Take 2 capsules (600 mg total) by mouth at bedtime. 12/07/19   Suzzanne Cloud, NP  Glucosamine HCl (GLUCOSAMINE PO) Take by mouth.    [provider]  lisinopril (ZESTRIL) 10 MG tablet Take 10 mg by mouth daily. 12/20/19   [provider]  Multiple Vitamin (MULTIVITAMIN) tablet Take 1 tablet by mouth every evening.     [provider]  Omega-3 Fatty Acids (FISH OIL) 1200 MG CAPS Take 1,200 mg by mouth daily.     [provider]  pantoprazole (PROTONIX) 40 MG tablet Take 1 tablet (40 mg total) by mouth daily. 11/09/19   Irene Shipper, MD  pramipexole (MIRAPEX) 0.5 MG tablet Take 1 tablet (0.5 mg total) by mouth at bedtime. 12/07/19   Suzzanne Cloud, NP  rosuvastatin (CRESTOR) 10 MG tablet Take 1 tablet (10 mg total) by mouth daily. 10/17/19 01/15/20  Donato Heinz, MD  TURMERIC PO Take 1 capsule by mouth 2 (two) times a day. 1000 mg    [provider]    Allergies    Flagyl [metronidazole], Nsaids, and Tetracycline hcl  Review of Systems   Review of Systems  Constitutional: Negative for appetite change.  HENT: Positive for facial swelling.   Respiratory: Negative for chest tightness and shortness of breath.   Cardiovascular: Negative for chest pain.  Gastrointestinal: Negative for abdominal pain.  Genitourinary: Negative for flank pain.  Musculoskeletal: Negative for back pain.  Skin: Positive for rash.  Neurological: Negative for tremors.   Psychiatric/Behavioral: Negative for behavioral problems.    Physical Exam Updated Vital Signs BP 127/69   Pulse 71   Temp 98.1 F (36.7 C) (Oral)   Resp 17   Ht 5' 4"  (1.626 m)   Wt 83.9 kg   SpO2 96%   BMI 31.76 kg/m   Physical Exam Vitals and nursing note reviewed.  HENT:     Head: Atraumatic.  Comments: Mild swelling of lips, particularly upper lips.  No tongue swelling.    Right Ear: External ear normal.     Left Ear: External ear normal.     Mouth/Throat:     Comments: Mild posterior pharyngeal edema.  No stridor.  Uvula midline. Eyes:     Comments: Mild periorbital edema.  Some erythema face.  Cardiovascular:     Rate and Rhythm: Regular rhythm.  Musculoskeletal:        General: No tenderness.     Cervical back: Neck supple.     Comments: No swelling of upper extremities.  Skin:    Capillary Refill: Capillary refill takes less than 2 seconds.     Comments: Mild erythema of chest.  Neurological:     Mental Status: She is alert and oriented to person, place, and time.     ED Results / Procedures / Treatments   Labs (all labs ordered are listed, but only abnormal results are displayed) Labs Reviewed - No data to display  EKG None  Radiology No results found.  Procedures Procedures (including critical care time)  Medications Ordered in ED Medications  dexamethasone (DECADRON) tablet 6 mg (6 mg Oral Given 05/23/20 1042)    ED Course  I have reviewed the triage vital signs and the nursing notes.  Pertinent labs & imaging results that were available during my care of the patient were reviewed by me and considered in my medical decision making (see chart for details).    MDM Rules/Calculators/A&P                          Patient with some angioedema face.  Also had pruritic itchy rash.  No known exposure, has been rather stable since last night.  Is on lisinopril but with the itchiness on the chest I think less likely angioedema from ACE  inhibitor.  Will treat with steroids.  Will need outpatient follow-up.  Other cause of facial swelling such as SVC syndrome felt less likely but without improvement will need more follow-up.  Will return for worsening symptoms.  If continued swelling may need to stop the lisinopril.  Will return for worsening symptoms.  Discharge home. Final Clinical Impression(s) / ED Diagnoses Final diagnoses:  Allergic reaction, initial encounter  Angioedema, initial encounter    Rx / DC Orders ED Discharge Orders    None       Davonna Belling, MD 05/23/20 [phone]

## 2020-05-23 NOTE — Discharge Instructions (Signed)
You have had some steroids here. Benadryl may still help. Watch for worsening swelling. If there is mild worsening tomorrow may need to stop your lisinopril. Follow-up with your doctor as needed.

## 2020-05-23 NOTE — ED Notes (Signed)
Pt reports that she did take benadryl po PTA

## 2020-05-24 DIAGNOSIS — M5417 Radiculopathy, lumbosacral region: Secondary | ICD-10-CM | POA: Diagnosis not present

## 2020-05-24 DIAGNOSIS — M5416 Radiculopathy, lumbar region: Secondary | ICD-10-CM | POA: Diagnosis not present

## 2020-05-24 MED ORDER — AMLODIPINE BESYLATE 5 MG PO TABS: 5.0000 mg | ORAL_TABLET | Freq: Every day | ORAL | 3 refills | Status: DC

## 2020-05-24 NOTE — Telephone Encounter (Signed)
Since stopping lisinopril would recommend starting amlodipine 5 mg daily.  Would check BP twice daily for next 2 weeks and call with results.

## 2020-05-31 DIAGNOSIS — H2512 Age-related nuclear cataract, left eye: Secondary | ICD-10-CM | POA: Diagnosis not present

## 2020-05-31 DIAGNOSIS — H25812 Combined forms of age-related cataract, left eye: Secondary | ICD-10-CM | POA: Diagnosis not present

## 2020-06-12 MED ORDER — AMLODIPINE BESYLATE 5 MG PO TABS: 10.0000 mg | ORAL_TABLET | Freq: Every day | ORAL | 3 refills | Status: DC

## 2020-06-12 NOTE — Addendum Note (Signed)
Addended by: Patria Mane A on: 06/12/2020 08:08 AM   Modules accepted: Orders

## 2020-06-28 DIAGNOSIS — T8522XA Displacement of intraocular lens, initial encounter: Secondary | ICD-10-CM | POA: Diagnosis not present

## 2020-06-28 DIAGNOSIS — H25811 Combined forms of age-related cataract, right eye: Secondary | ICD-10-CM | POA: Diagnosis not present

## 2020-06-28 DIAGNOSIS — H2511 Age-related nuclear cataract, right eye: Secondary | ICD-10-CM | POA: Diagnosis not present

## 2020-07-25 DIAGNOSIS — H6121 Impacted cerumen, right ear: Secondary | ICD-10-CM | POA: Diagnosis not present

## 2020-07-26 DIAGNOSIS — H9201 Otalgia, right ear: Secondary | ICD-10-CM | POA: Diagnosis not present

## 2020-08-09 ENCOUNTER — Encounter: Payer: Self-pay | Admitting: Internal Medicine

## 2020-08-14 ENCOUNTER — Other Ambulatory Visit: Payer: Self-pay

## 2020-08-14 DIAGNOSIS — R1312 Dysphagia, oropharyngeal phase: Secondary | ICD-10-CM

## 2020-08-20 ENCOUNTER — Encounter: Payer: Self-pay | Admitting: Internal Medicine

## 2020-08-20 MED ORDER — AMLODIPINE BESYLATE 10 MG PO TABS: 10.0000 mg | ORAL_TABLET | Freq: Every day | ORAL | 3 refills | Status: DC

## 2020-09-14 ENCOUNTER — Other Ambulatory Visit: Payer: Self-pay | Admitting: Cardiology

## 2020-09-24 ENCOUNTER — Other Ambulatory Visit: Payer: Self-pay | Admitting: Cardiology

## 2020-09-24 NOTE — Telephone Encounter (Signed)
This is Dr. Schumann's pt 

## 2020-10-31 ENCOUNTER — Other Ambulatory Visit: Payer: Self-pay

## 2020-10-31 ENCOUNTER — Ambulatory Visit (AMBULATORY_SURGERY_CENTER): Payer: Medicare Other | Admitting: *Deleted

## 2020-10-31 VITALS — Ht 64.0 in | Wt 183.0 lb

## 2020-10-31 DIAGNOSIS — Z1211 Encounter for screening for malignant neoplasm of colon: Secondary | ICD-10-CM

## 2020-10-31 MED ORDER — PLENVU 140 G PO SOLR: 1.0000 | ORAL | 0 refills | Status: DC

## 2020-10-31 NOTE — Progress Notes (Signed)
Pt verified name, DOB, address and insurance during PV today. Pt mailed instruction packet to included paper to complete and mail back to [facility] with addressed and stamped envelope, Emmi video, copy of consent form to read and not return, and instructions. Plenvu  medicare coupon mailed in packet.  Plenvu tier 1 per computer . PV completed over the phone. Pt encouraged to call with questions or issues. My Chart instructions to pt as well    No egg or soy allergy known to patient  No issues with past sedation with any surgeries or procedures Patient denies ever being told they had issues or difficulty with intubation  No FH of Malignant Hyperthermia No diet pills per patient No home 02 use per patient  No blood thinners per patient  Pt denies issues with constipation  No A fib or A flutter  EMMI video to pt or via MyChart  COVID 19 guidelines implemented in [place] today with Pt and RN  Pt is fully vaccinated  for Covid   Plenvu medicare  Coupon given to pt in PV today , Code to Pharmacy and  NO PA's for preps discussed with pt In PV today  Discussed with pt there will be an out-of-pocket cost for prep and that varies from $0 to 70 dollars   Due to the COVID-19 pandemic we are asking patients to follow certain guidelines.  Pt aware of COVID protocols and [facility] guidelines

## 2020-11-08 ENCOUNTER — Ambulatory Visit (INDEPENDENT_AMBULATORY_CARE_PROVIDER_SITE_OTHER): Payer: Medicare Other | Admitting: Orthopaedic Surgery

## 2020-11-08 ENCOUNTER — Other Ambulatory Visit: Payer: Self-pay | Admitting: Family Medicine

## 2020-11-08 ENCOUNTER — Encounter: Payer: Self-pay | Admitting: Orthopaedic Surgery

## 2020-11-08 ENCOUNTER — Ambulatory Visit (INDEPENDENT_AMBULATORY_CARE_PROVIDER_SITE_OTHER): Payer: Medicare Other

## 2020-11-08 ENCOUNTER — Other Ambulatory Visit: Payer: Self-pay | Admitting: Obstetrics & Gynecology

## 2020-11-08 DIAGNOSIS — Z9889 Other specified postprocedural states: Secondary | ICD-10-CM | POA: Diagnosis not present

## 2020-11-08 DIAGNOSIS — Z8781 Personal history of (healed) traumatic fracture: Secondary | ICD-10-CM

## 2020-11-08 DIAGNOSIS — Z1231 Encounter for screening mammogram for malignant neoplasm of breast: Secondary | ICD-10-CM

## 2020-11-08 DIAGNOSIS — M25572 Pain in left ankle and joints of left foot: Secondary | ICD-10-CM

## 2020-11-08 DIAGNOSIS — M19072 Primary osteoarthritis, left ankle and foot: Secondary | ICD-10-CM | POA: Diagnosis not present

## 2020-11-08 MED ORDER — METHYLPREDNISOLONE ACETATE 40 MG/ML IJ SUSP
40.0000 mg | INTRAMUSCULAR | Status: AC | PRN
  Administered 2020-11-08: 40 mg via INTRA_ARTICULAR

## 2020-11-08 MED ORDER — LIDOCAINE HCL 1 % IJ SOLN
2.0000 mL | INTRAMUSCULAR | Status: AC | PRN
Start: 2020-11-08 — End: 2020-11-08
  Administered 2020-11-08: 2 mL

## 2020-11-08 NOTE — Progress Notes (Signed)
Office Visit Note   Patient: Meghan [name]           Date of Birth: 07/21/1945           MRN: [id number] Visit Date: 11/08/2020              Requested by: Hulan Fess, MD [place],  [place] [id number] PCP: Hulan Fess, MD   Assessment & Plan: Visit Diagnoses:  1. Pain in left ankle and joints of left foot   2. Status post ORIF of fracture of ankle   3. Arthritis of left ankle     Plan: I did go over the x-rays with her of her left ankle.  She has developed posttraumatic arthritis.  Today I did recommend a steroid injection in the left ankle joint and she agrees to this and tolerated well.  I do feel that she would benefit from wearing an ankle sleeve such as something to copper fit and I showed her some pictures to look at.  She will continue anti-inflammatories.  I would like to see her back in 6 weeks to see how she is doing overall.  All questions and concerns were answered and addressed.  Follow-Up Instructions: Return in about 6 weeks (around 12/20/2020).   Orders:  Orders Placed This Encounter  Procedures   Medium Joint Inj   XR Ankle Complete Left   No orders of the defined types were placed in this encounter.     Procedures: Medium Joint Inj: L ankle on 11/08/2020 4:00 PM Details: anterolateral approach Medications: 2 mL lidocaine 1 %; 40 mg methylPREDNISolone acetate 40 MG/ML     Clinical Data: No additional findings.   Subjective: Chief Complaint  Patient presents with   Left Ankle - Pain  The patient is a 75 year old female well-known to me.  We actually fixed a bimalleolar ankle fracture on her in 2017 that has been 5 years now.  She has been developing left ankle pain that just started hurting for few months now.  She has been taking some Tylenol arthritis at night.  It is causing her to walk with a limp as well.  She has not had any injury recently to that ankle.  HPI  Review of Systems There is currently listed no headache, chest  pain, shortness of breath, fever, chills, nausea, vomiting  Objective: Vital Signs: There were no vitals taken for this visit.  Physical Exam She is alert and orient x3 and in no acute distress Ortho Exam She does have good range of motion of her left ankle and the incisions medial lateral of healed.  There is some slight ankle swelling.  She does have anterior lateral and anterior medial ankle pain on range of motion and weightbearing. Specialty Comments:  No specialty comments available.  Imaging: XR Ankle Complete Left  Result Date: 11/08/2020 3 views the left ankle show intact hardware from a remote fixation of a bimalleolar ankle fracture.  There is arthritis of the ankle joint prominent is medial but also probably some impingement anteriorly.    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   GERD 04/10/2008   DIVERTICULOSIS, COLON 04/10/2008   ARTHRITIS 04/10/2008   Past Medical History:  Diagnosis Date   Allergy    Arthritis    Breast cancer ([place]) 2006   IDC+DCIS of Left breast; ER/PR+, Her2-; 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%   Cataract    REMOVED 05-2020 AND 06-2020 BILAT   Colon polyps    Hyperplastic    Diverticulosis of colon (without mention of hemorrhage)    Esophageal stricture    GERD (gastroesophageal reflux disease)    History of radiation therapy    History of urinary tract infection    Hypertension    CONTROLLED- OFF MEDS -   Lower back pain    Neuromuscular disorder ([facility])     peripheral nueropathy   Numbness    feet bilat at nighttime   Osteopenia    Personal history of radiation therapy 2006   Restless leg syndrome 03/27/2014   Restless legs syndrome (RLS)    Toe pain, right 09/2014   for 2 wks   Urinary urgency     Family History  Problem Relation Age of Onset   Heart attack Mother    COPD Father    Heart disease Father    Emphysema Father        smoker   Breast cancer Sister 11       mastectomy; metastasis to liver and lung   Obesity Brother    Breast cancer Maternal Aunt        dx. late 60s   Alzheimer's disease Maternal Aunt    Breast cancer Maternal Aunt        dx. 70s   Squamous cell carcinoma Maternal Aunt        recently removed; dx. 95   Heart disease Maternal Grandmother    Congestive Heart Failure Maternal Grandmother    Heart disease Paternal Grandmother    Breast cancer Cousin 62       genetic testing in approx 2015   Ovarian cancer Cousin        dx. 20s   Esophageal cancer Cousin        not a smoker   Neuropathy Neg Hx    Thyroid disease Neg Hx    Colon cancer Neg Hx    Colon polyps Neg Hx    Rectal cancer Neg Hx    Stomach cancer Neg Hx     Past Surgical History:  Procedure Laterality Date   BREAST LUMPECTOMY Left 2006   BREAST SURGERY     left 2006   CESAREAN SECTION     COLONOSCOPY     ORIF ANKLE FRACTURE Left 08/24/2015   Procedure: OPEN REDUCTION INTERNAL FIXATION (ORIF) LEFT ANKLE FRACTURE;  Surgeon: Mcarthur Rossetti, MD;  Location: [facility];  Service: Orthopedics;  Laterality: Left;  General with a popliteal block   POLYPECTOMY     HPP 2011   Social History   Occupational History   Occupation: Programmer, multimedia: [employer]   Occupation: Retired  Tobacco Use   Smoking status: Never   Smokeless tobacco: Never  Vaping Use   Vaping Use: Never used  Substance and Sexual Activity   Alcohol use: Yes    Alcohol/week: 0.0 standard drinks    Comment: very rarely - wine   Drug use: No   Sexual  activity: Yes

## 2020-11-13 ENCOUNTER — Telehealth: Payer: Self-pay | Admitting: *Deleted

## 2020-11-13 NOTE — Telephone Encounter (Signed)
A message was left re: scheduling her CT.

## 2020-11-14 ENCOUNTER — Other Ambulatory Visit: Payer: Self-pay

## 2020-11-14 ENCOUNTER — Telehealth: Payer: Self-pay

## 2020-11-14 ENCOUNTER — Ambulatory Visit (AMBULATORY_SURGERY_CENTER): Payer: Medicare Other | Admitting: Internal Medicine

## 2020-11-14 VITALS — BP 143/77 | HR 77 | Temp 97.5°F | Resp 19 | Ht 64.0 in

## 2020-11-14 DIAGNOSIS — Z1211 Encounter for screening for malignant neoplasm of colon: Secondary | ICD-10-CM

## 2020-11-14 MED ORDER — SODIUM CHLORIDE 0.9 % IV SOLN: 500.0000 mL | Freq: Once | INTRAVENOUS | Status: DC

## 2020-11-14 NOTE — Op Note (Signed)
[facility] Patient Name: Meghan [name] Procedure Date: 11/14/2020 12:21 PM MRN: [id number] Endoscopist: Docia Chuck. Henrene Pastor , MD Age: 75 Referring MD:  Date of Birth: 01-08-1946 Gender: Female Account #: [id number] Procedure:                Colonoscopy Indications:              Screening for colorectal malignant neoplasm.                            Previous examination 2011 was negative for neoplasia Medicines:                Monitored Anesthesia Care Procedure:                Pre-Anesthesia Assessment:                           - Prior to the procedure, a History and Physical                            was performed, and patient medications and                            allergies were reviewed. The patient's tolerance of                            previous anesthesia was also reviewed. The risks                            and benefits of the procedure and the sedation                            options and risks were discussed with the patient.                            All questions were answered, and informed consent                            was obtained. Prior Anticoagulants: The patient has                            taken no previous anticoagulant or antiplatelet                            agents. ASA Grade Assessment: II - A patient with                            mild systemic disease. After reviewing the risks                            and benefits, the patient was deemed in                            satisfactory condition to undergo the procedure.  After obtaining informed consent, the colonoscope                            was passed under direct vision. Throughout the                            procedure, the patient's blood pressure, pulse, and                            oxygen saturations were monitored continuously. The                            Olympus PCF-H190DL (#[id number]) Colonoscope was                            introduced  through the anus and advanced to the the                            cecum, identified by appendiceal orifice and                            ileocecal valve. The ileocecal valve, appendiceal                            orifice, and rectum were photographed. The quality                            of the bowel preparation was good. The colonoscopy                            was performed without difficulty. The patient                            tolerated the procedure well. The bowel preparation                            used was Plenvu via split dose instruction. Scope In: 12:37:59 PM Scope Out: 12:55:55 PM Scope Withdrawal Time: 0 hours 13 minutes 7 seconds  Total Procedure Duration: 0 hours 17 minutes 56 seconds  Findings:                 Many small and large-mouthed diverticula were found                            in the entire colon. There was sigmoid stenosis                            secondary to diverticular disease.                           The exam was otherwise without abnormality on                            direct and retroflexion views. Complications:  No immediate complications. Estimated blood loss:                            None. Estimated Blood Loss:     Estimated blood loss: none. Impression:               - Severe diverticulosis in the entire examined                            colon. There was sigmoid stenosis due to the same.                           - The examination was otherwise normal on direct                            and retroflexion views.                           - No specimens collected. Recommendation:           - Repeat colonoscopy is not recommended for                            screening purposes.                           - Patient has a contact number available for                            emergencies. The signs and symptoms of potential                            delayed complications were discussed with the                             patient. Return to normal activities tomorrow.                            Written discharge instructions were provided to the                            patient.                           - Resume previous diet.                           - Continue present medications. Docia Chuck. Henrene Pastor, MD 11/14/2020 1:01:51 PM This report has been signed electronically.

## 2020-11-14 NOTE — Patient Instructions (Signed)
Handout given for diverticulosis.  You don't need another colonoscopy due to your age and absence of polyps.  YOU HAD AN ENDOSCOPIC PROCEDURE TODAY AT [facility]:   Refer to the procedure report that was given to you for any specific questions about what was found during the examination.  If the procedure report does not answer your questions, please call your gastroenterologist to clarify.  If you requested that your care partner not be given the details of your procedure findings, then the procedure report has been included in a sealed envelope for you to review at your convenience later.  YOU SHOULD EXPECT: Some feelings of bloating in the abdomen. Passage of more gas than usual.  Walking can help get rid of the air that was put into your GI tract during the procedure and reduce the bloating. If you had a lower endoscopy (such as a colonoscopy or flexible sigmoidoscopy) you may notice spotting of blood in your stool or on the toilet paper. If you underwent a bowel prep for your procedure, you may not have a normal bowel movement for a few days.  Please Note:  You might notice some irritation and congestion in your nose or some drainage.  This is from the oxygen used during your procedure.  There is no need for concern and it should clear up in a day or so.  SYMPTOMS TO REPORT IMMEDIATELY:  Following lower endoscopy (colonoscopy or flexible sigmoidoscopy):  Excessive amounts of blood in the stool  Significant tenderness or worsening of abdominal pains  Swelling of the abdomen that is new, acute  Fever of 100F or higher  For urgent or emergent issues, a gastroenterologist can be reached at any hour by calling [phone]. Do not use MyChart messaging for urgent concerns.    DIET:  We do recommend a small meal at first, but then you may proceed to your regular diet.  Drink plenty of fluids but you should avoid alcoholic beverages for 24 hours.  ACTIVITY:  You should plan  to take it easy for the rest of today and you should NOT DRIVE or use heavy machinery until tomorrow (because of the sedation medicines used during the test).    FOLLOW UP: Our staff will call the number listed on your records 48-72 hours following your procedure to check on you and address any questions or concerns that you may have regarding the information given to you following your procedure. If we do not reach you, we will leave a message.  We will attempt to reach you two times.  During this call, we will ask if you have developed any symptoms of COVID 19. If you develop any symptoms (ie: fever, flu-like symptoms, shortness of breath, cough etc.) before then, please call [phone].  If you test positive for Covid 19 in the 2 weeks post procedure, please call and report this information to [place].    If any biopsies were taken you will be contacted by phone or by letter within the next 1-3 weeks.  Please call us at [phone] if you have not heard about the biopsies in 3 weeks.    SIGNATURES/CONFIDENTIALITY: You and/or your care partner have signed paperwork which will be entered into your electronic medical record.  These signatures attest to the fact that that the information above on your After Visit Summary has been reviewed and is understood.  Full responsibility of the confidentiality of this discharge information lies with you and/or your care-partner.

## 2020-11-14 NOTE — Progress Notes (Signed)
pt tolerated well. VSS. awake and to recovery. Report given to RN.  

## 2020-11-14 NOTE — Telephone Encounter (Signed)
Ok to refill 

## 2020-11-14 NOTE — Progress Notes (Signed)
C.W. vital signs. 

## 2020-11-15 MED ORDER — CILIDINIUM-CHLORDIAZEPOXIDE 2.5-5 MG PO CAPS: 1.0000 | ORAL_CAPSULE | ORAL | 3 refills | Status: DC | PRN

## 2020-11-15 NOTE — Telephone Encounter (Signed)
Faxed prescription for Librax to [facility]

## 2020-11-16 ENCOUNTER — Telehealth: Payer: Self-pay | Admitting: *Deleted

## 2020-11-16 NOTE — Telephone Encounter (Signed)
  Follow up Call-  Call back number 11/14/2020  Post procedure Call Back phone  # [phone]  Permission to leave phone message Yes  Some recent data might be hidden     Patient questions:  Do you have a fever, pain , or abdominal swelling? No. Pain Score  0 *  Have you tolerated food without any problems? Yes.    Have you been able to return to your normal activities? Yes.    Do you have any questions about your discharge instructions: Diet   No. Medications  No. Follow up visit  No.  Do you have questions or concerns about your Care? No.  Actions: * If pain score is 4 or above: No action needed, pain <4.  Have you developed a fever since your procedure? no  2.   Have you had an respiratory symptoms (SOB or cough) since your procedure? no  3.   Have you tested positive for COVID 19 since your procedure no  4.   Have you had any family members/close contacts diagnosed with the COVID 19 since your procedure?  no   If yes to any of these questions please route to Joylene John, RN and Joella Prince, RN

## 2020-11-23 DIAGNOSIS — K219 Gastro-esophageal reflux disease without esophagitis: Secondary | ICD-10-CM | POA: Diagnosis not present

## 2020-11-23 DIAGNOSIS — E042 Nontoxic multinodular goiter: Secondary | ICD-10-CM | POA: Diagnosis not present

## 2020-11-23 DIAGNOSIS — E669 Obesity, unspecified: Secondary | ICD-10-CM | POA: Diagnosis not present

## 2020-11-23 DIAGNOSIS — N189 Chronic kidney disease, unspecified: Secondary | ICD-10-CM | POA: Diagnosis not present

## 2020-11-23 DIAGNOSIS — R7301 Impaired fasting glucose: Secondary | ICD-10-CM | POA: Diagnosis not present

## 2020-11-23 DIAGNOSIS — Z0000 Encounter for general adult medical examination without abnormal findings: Secondary | ICD-10-CM | POA: Diagnosis not present

## 2020-11-30 ENCOUNTER — Other Ambulatory Visit: Payer: Self-pay | Admitting: Family Medicine

## 2020-11-30 DIAGNOSIS — Z86010 Personal history of colonic polyps: Secondary | ICD-10-CM | POA: Diagnosis not present

## 2020-11-30 DIAGNOSIS — K219 Gastro-esophageal reflux disease without esophagitis: Secondary | ICD-10-CM | POA: Diagnosis not present

## 2020-11-30 DIAGNOSIS — G2581 Restless legs syndrome: Secondary | ICD-10-CM | POA: Diagnosis not present

## 2020-11-30 DIAGNOSIS — N1831 Chronic kidney disease, stage 3a: Secondary | ICD-10-CM | POA: Diagnosis not present

## 2020-11-30 DIAGNOSIS — E669 Obesity, unspecified: Secondary | ICD-10-CM | POA: Diagnosis not present

## 2020-11-30 DIAGNOSIS — Z8719 Personal history of other diseases of the digestive system: Secondary | ICD-10-CM | POA: Diagnosis not present

## 2020-11-30 DIAGNOSIS — E041 Nontoxic single thyroid nodule: Secondary | ICD-10-CM | POA: Diagnosis not present

## 2020-11-30 DIAGNOSIS — N189 Chronic kidney disease, unspecified: Secondary | ICD-10-CM | POA: Diagnosis not present

## 2020-11-30 DIAGNOSIS — R7301 Impaired fasting glucose: Secondary | ICD-10-CM | POA: Diagnosis not present

## 2020-11-30 DIAGNOSIS — G629 Polyneuropathy, unspecified: Secondary | ICD-10-CM | POA: Diagnosis not present

## 2020-11-30 DIAGNOSIS — E042 Nontoxic multinodular goiter: Secondary | ICD-10-CM

## 2020-11-30 DIAGNOSIS — Z0000 Encounter for general adult medical examination without abnormal findings: Secondary | ICD-10-CM | POA: Diagnosis not present

## 2020-12-03 MED ORDER — ROSUVASTATIN CALCIUM 10 MG PO TABS: 10.0000 mg | ORAL_TABLET | Freq: Every day | ORAL | 1 refills | Status: DC

## 2020-12-06 ENCOUNTER — Ambulatory Visit: Payer: Medicare Other | Admitting: Neurology

## 2020-12-10 ENCOUNTER — Telehealth: Payer: Self-pay

## 2020-12-10 MED ORDER — PANTOPRAZOLE SODIUM 40 MG PO TBEC: 40.0000 mg | DELAYED_RELEASE_TABLET | Freq: Every day | ORAL | 3 refills | Status: DC

## 2020-12-10 NOTE — Telephone Encounter (Signed)
Pantoprazole sent to [facility]

## 2020-12-14 ENCOUNTER — Other Ambulatory Visit: Payer: Self-pay

## 2020-12-14 ENCOUNTER — Ambulatory Visit
Admission: RE | Admit: 2020-12-14 | Discharge: 2020-12-14 | Disposition: A | Discharge status: Home / Self Care | Payer: Medicare Other | Source: Ambulatory Visit | Attending: Family Medicine | Admitting: Family Medicine

## 2020-12-14 DIAGNOSIS — E042 Nontoxic multinodular goiter: Secondary | ICD-10-CM

## 2020-12-14 DIAGNOSIS — E041 Nontoxic single thyroid nodule: Secondary | ICD-10-CM | POA: Diagnosis not present

## 2020-12-19 ENCOUNTER — Other Ambulatory Visit: Payer: Self-pay

## 2020-12-19 ENCOUNTER — Ambulatory Visit (INDEPENDENT_AMBULATORY_CARE_PROVIDER_SITE_OTHER): Payer: Medicare Other | Admitting: Orthopaedic Surgery

## 2020-12-19 DIAGNOSIS — S91002A Unspecified open wound, left ankle, initial encounter: Secondary | ICD-10-CM | POA: Diagnosis not present

## 2020-12-19 MED ORDER — DOXYCYCLINE HYCLATE 100 MG PO TABS: 100.0000 mg | ORAL_TABLET | Freq: Two times a day (BID) | ORAL | 0 refills | Status: AC

## 2020-12-19 NOTE — Progress Notes (Signed)
HPI: [name] comes in today due to left ankle pain.  She reports over the weekend she slipped off a stepstool.  She has a history of open reduction internal fixation left ankle 4-17.  She states that the incision looks like it is open some.  She has some redness.  She has had no fevers chills.  She has been placing antibiotic ointment on the incision.  Reports that the left ankle injection on 11/08/2020 did not help with her ankle pain.  Review of systems: See HPI otherwise negative.  Physical exam: General: Well-developed well-nourished female no acute distress ambulates without any assistive device.  Nonantalgic gait.  Left ankle surgical incision distal one fourth of the incision are superficial dehiscence of the wound.  No exposed hardware.  There is no drainage no expressible purulence.  Positive her erythema about the incision site.  Calf supple nontender good range of motion of the left ankle without significant pain.  Impression: Left ankle wound  Plan: Place her on doxycycline 100 mg twice daily.  She states that she has tolerated doxycycline well in the past despite the fact that she does have a tetracycline allergy listed.  She will wash the ankle daily with antibacterial soap and then apply a small amount of Bactroban and a Band-Aid.  We will see her back next Wednesday sooner if she develops any purulence or any drainage from the wound.  Questions encouraged and answered.

## 2020-12-21 ENCOUNTER — Ambulatory Visit (INDEPENDENT_AMBULATORY_CARE_PROVIDER_SITE_OTHER): Payer: Medicare Other | Admitting: Neurology

## 2020-12-21 ENCOUNTER — Encounter: Payer: Self-pay | Admitting: Neurology

## 2020-12-21 VITALS — BP 144/79 | HR 82 | Ht 64.0 in | Wt 193.2 lb

## 2020-12-21 DIAGNOSIS — G250 Essential tremor: Secondary | ICD-10-CM | POA: Diagnosis not present

## 2020-12-21 DIAGNOSIS — R202 Paresthesia of skin: Secondary | ICD-10-CM

## 2020-12-21 DIAGNOSIS — G2581 Restless legs syndrome: Secondary | ICD-10-CM | POA: Diagnosis not present

## 2020-12-21 DIAGNOSIS — E538 Deficiency of other specified B group vitamins: Secondary | ICD-10-CM | POA: Diagnosis not present

## 2020-12-21 HISTORY — DX: Essential tremor: G25.0

## 2020-12-21 MED ORDER — PRAMIPEXOLE DIHYDROCHLORIDE 0.75 MG PO TABS: 0.7500 mg | ORAL_TABLET | Freq: Every day | ORAL | 3 refills | Status: DC

## 2020-12-21 NOTE — Progress Notes (Signed)
Reason for visit: Restless leg syndrome  Meghan [name] is an 75 y.o. female  History of present illness:  Meghan [name] is a 75 year old right-handed white female with a history of restless leg syndrome dating back over a years.  The patient returns to the office today with some new issues.  She has reported over the last 2 or 3 months that she has noted some sensation of imbalance.  She notes that when she closes her eyes that she has difficulty maintaining posture.  She recently fell off of a stepladder, possibly related to this imbalance issue.  She notes that if she touches the wall or chair, this improves the sensation of imbalance.  She does report some numbness in the toes of the feet at night with some stinging sensations.  She does have low back pain without radiation down the lower extremities.  She will occasionally get injections in the back with some benefit.  She has been told that she has spondylolisthesis.  The patient denies any trouble controlling the bowels or the bladder.  She denies any numbness or sensory alteration in the hands.  She denies neck pain.  She has developed a mild tremor that is side to side involving the head and neck, no tremors of the arms are noted.  She denies any family history of diabetes.  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%   Cataract    REMOVED 05-2020 AND 06-2020 BILAT   Colon polyps    Hyperplastic    Diverticulosis of colon (without mention of hemorrhage)    Esophageal stricture    GERD (gastroesophageal reflux disease)    History of radiation therapy    History of urinary tract infection    Hypertension    CONTROLLED- OFF MEDS -   Lower back pain    Neuromuscular disorder (HCC)    peripheral nueropathy   Numbness    feet bilat at nighttime   Osteopenia    Personal history of radiation therapy 2006   Restless leg syndrome  03/27/2014   Restless legs syndrome (RLS)    Toe pain, right 09/2014   for 2 wks   Urinary urgency     Past Surgical History:  Procedure Laterality Date   BREAST LUMPECTOMY Left 2006   BREAST SURGERY     left 2006   CESAREAN SECTION     COLONOSCOPY     ORIF ANKLE FRACTURE Left 08/24/2015   Procedure: OPEN REDUCTION INTERNAL FIXATION (ORIF) LEFT ANKLE FRACTURE;  Surgeon: Mcarthur Rossetti, MD;  Location: [facility];  Service: Orthopedics;  Laterality: Left;  General with a popliteal block   POLYPECTOMY     HPP 2011    Family History  Problem Relation Age of Onset   Heart attack Mother    COPD Father    Heart disease Father    Emphysema Father        smoker   Breast cancer Sister 51       mastectomy; metastasis to liver and lung   Obesity Brother    Breast cancer Maternal Aunt        dx. late 42s   Alzheimer's disease Maternal Aunt    Breast cancer Maternal Aunt        dx. 70s   Squamous cell carcinoma Maternal Aunt        recently removed;  dx. 95   Heart disease Maternal Grandmother    Congestive Heart Failure Maternal Grandmother    Heart disease Paternal Grandmother    Breast cancer Cousin 62       genetic testing in approx 2015   Ovarian cancer Cousin        dx. 24s   Esophageal cancer Cousin        not a smoker   Neuropathy Neg Hx    Thyroid disease Neg Hx    Colon cancer Neg Hx    Colon polyps Neg Hx    Rectal cancer Neg Hx    Stomach cancer Neg Hx     Social history:  reports that she has never smoked. She has never used smokeless tobacco. She reports current alcohol use. She reports that she does not use drugs.    Allergies  Allergen Reactions   Lisinopril Swelling   Amlodipine Swelling   Flagyl [Metronidazole]    Nsaids Other (See Comments)    CKD - per PCP   Tetracycline Hcl Other (See Comments)    Medications:  Prior to Admission medications   Medication Sig Start Date End Date Taking? Authorizing Provider  Cholecalciferol (VITAMIN D3)  2000 units TABS Take 2,000 Units by mouth daily.     [provider]  clidinium-chlordiazePOXIDE (LIBRAX) 5-2.5 MG capsule Take 1 capsule by mouth as needed. 11/15/20 03/12/22  Irene Shipper, MD  Coenzyme Q10 (CO Q-10) 100 MG CAPS Take 200 mg by mouth daily.    [provider]  Cranberry-Vitamin C-Probiotic (AZO CRANBERRY) 250-30 MG TABS Take 1 tablet by mouth 2 (two) times daily.     [provider]  doxycycline (VIBRA-TABS) 100 MG tablet Take 1 tablet (100 mg total) by mouth 2 (two) times daily for 14 days. 12/19/20 01/02/21  Pete Pelt, PA-C  fluticasone (FLONASE) 50 MCG/ACT nasal spray Place 1 spray into both nostrils daily as needed for allergies or rhinitis.     [provider]  gabapentin (NEURONTIN) 300 MG capsule Take 2 capsules (600 mg total) by mouth at bedtime. 12/07/19   Suzzanne Cloud, NP  Glucosamine HCl (GLUCOSAMINE PO) Take by mouth.    [provider]  Multiple Vitamin (MULTIVITAMIN) tablet Take 1 tablet by mouth every evening.     [provider]  Omega-3 Fatty Acids (FISH OIL) 1200 MG CAPS Take 1,200 mg by mouth daily.     [provider]  pantoprazole (PROTONIX) 40 MG tablet Take 1 tablet (40 mg total) by mouth daily. 12/10/20   Irene Shipper, MD  pramipexole (MIRAPEX) 0.5 MG tablet Take 1 tablet (0.5 mg total) by mouth at bedtime. 12/07/19   Suzzanne Cloud, NP  rosuvastatin (CRESTOR) 10 MG tablet Take 1 tablet (10 mg total) by mouth daily. 12/03/20   Donato Heinz, MD  TURMERIC PO Take 1 capsule by mouth 2 (two) times a day. 1000 mg    [provider]    ROS:  Out of a complete 14 system review of symptoms, the patient complains only of the following symptoms, and all other reviewed systems are negative.  Tremor Balance issues Restless legs  Height 5' 4"  (1.626 m), weight 193 lb 4 oz (87.7 kg).  Physical Exam  General: The patient is alert and cooperative at the time of the  examination.  Skin: 1-2+ edema below the knees is seen bilaterally.   Neurologic Exam  Mental status: The patient is alert and oriented x 3 at  the time of the examination. The patient has apparent normal recent and remote memory, with an apparently normal attention span and concentration ability.   Cranial nerves: Facial symmetry is present. Speech is normal, no aphasia or dysarthria is noted. Extraocular movements are full. Visual fields are full.  The patient has a mild side-to-side head tremor.  Motor: The patient has good strength in all 4 extremities.  Sensory examination: Soft touch sensation is symmetric on the face, arms, and legs.  The patient does have a stocking pattern pinprick sensory deficit across the ankles bilaterally.  No impairment of vibration or position sense is seen on all 4 extremities.  Coordination: The patient has good finger-nose-finger and heel-to-shin bilaterally.  Gait and station: The patient has a normal gait. Tandem gait is slightly unsteady. Romberg is negative, but is slightly unsteady. No drift is seen.  Reflexes: Deep tendon reflexes are symmetric, but are depressed.   Assessment/Plan:  1.  Restless leg syndrome  2.  Gait instability  3.  Head and neck tremor, probable essential tremor  The patient indicates that her restless leg syndrome has worsened, she is having increased problems with uncomfortable sensations in the evening.  We will go up on the Mirapex to 0.75 mg at night, she will continue her gabapentin.  The patient will be sent for blood work today.  She will be sent for nerve conduction studies of both legs and EMG of right leg looking for an evolving peripheral neuropathy.  Nerve conduction studies done in 2015 were unremarkable.  She will otherwise follow-up in 1 year.  In the future, this patient can be followed through Dr. Krista Blue.  The patient appears to have a mild essential tremor, this may also be associated with some gait  instability.  Jill Alexanders MD 12/21/2020 7:58 AM  [facility] [address] [place], [place] [address]  Phone [phone] Fax [phone]

## 2020-12-26 ENCOUNTER — Ambulatory Visit (INDEPENDENT_AMBULATORY_CARE_PROVIDER_SITE_OTHER): Payer: Medicare Other | Admitting: Physician Assistant

## 2020-12-26 ENCOUNTER — Encounter: Payer: Self-pay | Admitting: Physician Assistant

## 2020-12-26 DIAGNOSIS — S91002A Unspecified open wound, left ankle, initial encounter: Secondary | ICD-10-CM

## 2020-12-26 NOTE — Progress Notes (Signed)
HPI: [name] returns today for follow-up of her left ankle wound.  She states overall she is doing better she had no fevers chills.  She continues to use doxycycline.  She is applying Bactroban ointment over the lateral incision dehiscence.  After washing it with antibacterial soap daily.  Physical exam: Left ankle area of wound dehiscence decreased erythema.  There is no purulence malodor.  The dehiscence area is healing well.  She has slight left leg edema calf supple nontender.  Good range of motion left ankle.  Impression: Left ankle wound dehiscence  Plan: Recommend she wear compression hose during the day.  She will continue current care of the wound.  Finish her doxycycline.  Follow-up with [place] in 2 weeks sooner if there is any signs of infection.  Questions encouraged and answered at length today.

## 2020-12-27 LAB — ANA W/REFLEX: Anti Nuclear Antibody (ANA): POSITIVE — AB

## 2020-12-27 LAB — MULTIPLE MYELOMA PANEL, SERUM
Albumin SerPl Elph-Mcnc: 3.7 g/dL (ref 2.9–4.4)
Albumin/Glob SerPl: 1.3 (ref 0.7–1.7)
Alpha 1: 0.2 g/dL (ref 0.0–0.4)
Alpha2 Glob SerPl Elph-Mcnc: 0.8 g/dL (ref 0.4–1.0)
B-Globulin SerPl Elph-Mcnc: 0.9 g/dL (ref 0.7–1.3)
Gamma Glob SerPl Elph-Mcnc: 1.1 g/dL (ref 0.4–1.8)
Globulin, Total: 3 g/dL (ref 2.2–3.9)
IgA/Immunoglobulin A, Serum: 142 mg/dL (ref 64–422)
IgG (Immunoglobin G), Serum: 907 mg/dL (ref 586–1602)
IgM (Immunoglobulin M), Srm: 334 mg/dL — ABNORMAL HIGH (ref 26–217)
Total Protein: 6.7 g/dL (ref 6.0–8.5)

## 2020-12-27 LAB — ANGIOTENSIN CONVERTING ENZYME: Angio Convert Enzyme: 51 U/L (ref 14–82)

## 2020-12-27 LAB — ENA+DNA/DS+SJORGEN'S
ENA RNP Ab: <0.2 AI (ref 0.0–0.9)
ENA SM Ab Ser-aCnc: <0.2 AI (ref 0.0–0.9)
ENA SSA (RO) Ab: <0.2 AI (ref 0.0–0.9)
ENA SSB (LA) Ab: <0.2 AI (ref 0.0–0.9)
dsDNA Ab: 10 IU/mL — ABNORMAL HIGH (ref 0–9)

## 2020-12-27 LAB — COPPER, SERUM: Copper: 97 ug/dL (ref 80–158)

## 2020-12-27 LAB — LYME DISEASE SEROLOGY W/REFLEX: Lyme Total Antibody EIA: NEGATIVE

## 2020-12-27 LAB — SEDIMENTATION RATE: Sed Rate: 7 mm/hr (ref 0–40)

## 2020-12-27 LAB — RPR: RPR Ser Ql: NONREACTIVE

## 2020-12-27 LAB — VITAMIN B12: Vitamin B-12: 463 pg/mL (ref 232–1245)

## 2021-01-03 HISTORY — PX: BREAST BIOPSY: SHX20

## 2021-01-04 ENCOUNTER — Other Ambulatory Visit: Payer: Self-pay

## 2021-01-04 ENCOUNTER — Ambulatory Visit
Admission: RE | Admit: 2021-01-04 | Discharge: 2021-01-04 | Disposition: A | Discharge status: Home / Self Care | Payer: Medicare Other | Source: Ambulatory Visit | Attending: Obstetrics & Gynecology | Admitting: Obstetrics & Gynecology

## 2021-01-04 DIAGNOSIS — Z1231 Encounter for screening mammogram for malignant neoplasm of breast: Secondary | ICD-10-CM | POA: Diagnosis not present

## 2021-01-09 ENCOUNTER — Ambulatory Visit (INDEPENDENT_AMBULATORY_CARE_PROVIDER_SITE_OTHER): Payer: Medicare Other | Admitting: Physician Assistant

## 2021-01-09 ENCOUNTER — Encounter: Payer: Self-pay | Admitting: Physician Assistant

## 2021-01-09 ENCOUNTER — Other Ambulatory Visit: Payer: Self-pay

## 2021-01-09 ENCOUNTER — Other Ambulatory Visit: Payer: Self-pay | Admitting: Obstetrics & Gynecology

## 2021-01-09 DIAGNOSIS — R928 Other abnormal and inconclusive findings on diagnostic imaging of breast: Secondary | ICD-10-CM

## 2021-01-09 DIAGNOSIS — S91002A Unspecified open wound, left ankle, initial encounter: Secondary | ICD-10-CM

## 2021-01-09 NOTE — Progress Notes (Signed)
HPI: Ms. Vaccari returns today follow-up of her left ankle wound.  Again this is a wound dehiscence from a trimalleolar ankle fracture from 2017.  Patient overall feels that the ankle wound is doing better still slightly tender.  She has some discomfort over the anterior mid ankle.  No mechanical symptoms.  Review of systems see HPI otherwise negative  Physical exam: General well-developed well-nourished female no acute distress. Left ankle lateral wound is well-healed there is just very small area of fibrinous tissue mid incision measuring a few millimeters.  No expressible purulence.  No drainage.  No signs of infection.  No malodor.  Calf supple nontender.  Able to dorsiflex plantarflex the ankle with some decreased range of motion.   Impression: Left ankle wound dehiscence  Plan: She will continue to wash the wound with an antibacterial soap.  She will follow-up with [place] as needed.  No submerging the wound in the pool, lake or ocean until wound is completely healed. Questions were encouraged and answered.

## 2021-01-11 ENCOUNTER — Other Ambulatory Visit: Payer: Self-pay | Admitting: Obstetrics & Gynecology

## 2021-01-11 ENCOUNTER — Other Ambulatory Visit: Payer: Self-pay | Admitting: Family Medicine

## 2021-01-11 ENCOUNTER — Ambulatory Visit
Admission: RE | Admit: 2021-01-11 | Discharge: 2021-01-11 | Disposition: A | Discharge status: Home / Self Care | Payer: Medicare Other | Source: Ambulatory Visit | Attending: Obstetrics & Gynecology | Admitting: Obstetrics & Gynecology

## 2021-01-11 ENCOUNTER — Other Ambulatory Visit: Payer: Self-pay

## 2021-01-11 DIAGNOSIS — Z01419 Encounter for gynecological examination (general) (routine) without abnormal findings: Secondary | ICD-10-CM | POA: Diagnosis not present

## 2021-01-11 DIAGNOSIS — R921 Mammographic calcification found on diagnostic imaging of breast: Secondary | ICD-10-CM | POA: Diagnosis not present

## 2021-01-11 DIAGNOSIS — R928 Other abnormal and inconclusive findings on diagnostic imaging of breast: Secondary | ICD-10-CM | POA: Diagnosis not present

## 2021-01-11 DIAGNOSIS — Z6834 Body mass index (BMI) 34.0-34.9, adult: Secondary | ICD-10-CM | POA: Diagnosis not present

## 2021-01-11 DIAGNOSIS — R922 Inconclusive mammogram: Secondary | ICD-10-CM | POA: Diagnosis not present

## 2021-01-14 ENCOUNTER — Ambulatory Visit
Admission: RE | Admit: 2021-01-14 | Discharge: 2021-01-14 | Disposition: A | Discharge status: Home / Self Care | Payer: Medicare Other | Source: Ambulatory Visit | Attending: Obstetrics & Gynecology | Admitting: Obstetrics & Gynecology

## 2021-01-14 ENCOUNTER — Other Ambulatory Visit: Payer: Self-pay

## 2021-01-14 DIAGNOSIS — R928 Other abnormal and inconclusive findings on diagnostic imaging of breast: Secondary | ICD-10-CM

## 2021-01-14 DIAGNOSIS — R921 Mammographic calcification found on diagnostic imaging of breast: Secondary | ICD-10-CM | POA: Diagnosis not present

## 2021-01-14 DIAGNOSIS — D0512 Intraductal carcinoma in situ of left breast: Secondary | ICD-10-CM | POA: Diagnosis not present

## 2021-01-17 ENCOUNTER — Encounter (INDEPENDENT_AMBULATORY_CARE_PROVIDER_SITE_OTHER): Payer: Medicare Other | Admitting: Diagnostic Neuroimaging

## 2021-01-17 ENCOUNTER — Other Ambulatory Visit: Payer: Self-pay

## 2021-01-17 ENCOUNTER — Ambulatory Visit (INDEPENDENT_AMBULATORY_CARE_PROVIDER_SITE_OTHER): Payer: Medicare Other | Admitting: Diagnostic Neuroimaging

## 2021-01-17 DIAGNOSIS — R202 Paresthesia of skin: Secondary | ICD-10-CM

## 2021-01-17 DIAGNOSIS — Z0289 Encounter for other administrative examinations: Secondary | ICD-10-CM

## 2021-01-18 NOTE — Procedures (Signed)
[facility]  NCS (NERVE CONDUCTION STUDY) WITH EMG (ELECTROMYOGRAPHY) REPORT   STUDY DATE: 01/17/21 PATIENT NAME: Meghan [name] DOB: 1945/08/14 MRN: [id number]  ORDERING CLINICIAN: Kathrynn Ducking, MD   TECHNOLOGIST: Sherre Scarlet ELECTROMYOGRAPHER: Earlean Polka. Emily Massar, MD  CLINICAL INFORMATION: 75 year old female with bilateral foot numbness and low back pain.  FINDINGS: NERVE CONDUCTION STUDY:  Bilateral peroneal motor responses are normal.  Bilateral tibial motor responses have decreased amplitudes and normal conduction velocities.  Bilateral sural and superficial peroneal sensory responses have decreased amplitudes and normal peak latencies.  Bilateral tibial F wave latencies are normal.   NEEDLE ELECTROMYOGRAPHY:  Needle examination of right lower extremity is normal.    IMPRESSION:   Abnormal study demonstrating: - Mild axonal sensorimotor polyneuropathy.    INTERPRETING PHYSICIAN:  Penni Bombard, MD Certified in Neurology, Neurophysiology and Neuroimaging  [facility] [address] [place], [place] [address] [phone]  [facility]    Nerve / Sites Muscle Latency Ref. Amplitude Ref. Rel Amp Segments Distance Velocity Ref. Area    ms ms mV mV %  cm m/s m/s mVms  L Peroneal - EDB     Ankle EDB 3.9 ?6.5 2.0 ?2.0 100 Ankle - EDB 9   8.0     Fib head EDB 9.6  1.7  87.3 Fib head - Ankle 25 44 ?44 5.6     Pop fossa EDB 11.9  1.9  110 Pop fossa - Fib head 10 44 ?44 6.9         Pop fossa - Ankle      R Peroneal - EDB     Ankle EDB 4.8 ?6.5 3.3 ?2.0 100 Ankle - EDB 9   11.5     Fib head EDB 10.7  2.9  86.6 Fib head - Ankle 26 44 ?44 10.3     Pop fossa EDB 12.9  2.8  95.2 Pop fossa - Fib head 10 45 ?44 10.6         Pop fossa - Ankle      L Tibial - AH     Ankle AH 4.1 ?5.8 2.3 ?4.0 100 Ankle - AH 9   6.3     Pop fossa AH 13.0  1.1  48.4 Pop fossa - Ankle 37 42 ?41 5.6  R Tibial - AH     Ankle AH 3.8 ?5.8 2.5  ?4.0 100 Ankle - AH 9   11.4     Pop fossa AH 11.8  2.0  79.7 Pop fossa - Ankle 36 45 ?41 13.6             SNC    Nerve / Sites Rec. Site Peak Lat Ref.  Amp Ref. Segments Distance    ms ms V V  cm  L Sural - Ankle (Calf)     Calf Ankle 3.4 ?4.4 3 ?6 Calf - Ankle 14  R Sural - Ankle (Calf)     Calf Ankle 2.8 ?4.4 3 ?6 Calf - Ankle 14  L Superficial peroneal - Ankle     Lat leg Ankle 3.1 ?4.4 4 ?6 Lat leg - Ankle 14  R Superficial peroneal - Ankle     Lat leg Ankle 3.3 ?4.4 2 ?6 Lat leg - Ankle 14             F  Wave    Nerve F Lat Ref.   ms ms  L Tibial - AH 52.6 ?56.0  R  Tibial - AH 53.8 ?56.0         EMG Summary Table    Spontaneous MUAP Recruitment  Muscle IA Fib PSW Fasc Other Amp Dur. Poly Pattern  R. Vastus medialis Normal None None None _______ Normal Normal Normal Normal  R. Tibialis anterior Normal None None None _______ Normal Normal Normal Normal  R. Gastrocnemius (Medial head) Normal None None None _______ Normal Normal Normal Normal

## 2021-01-20 ENCOUNTER — Telehealth: Payer: Self-pay | Admitting: Neurology

## 2021-01-20 NOTE — Telephone Encounter (Signed)
I called patient.  Nerve conduction and EMG evaluation showed very minimal neuropathic changes, mainly based on lowered amplitudes of the sensory latencies.  Otherwise, the study was normal.  If the patient desires to have medications for discomfort at night associated with neuropathy, she is to contact our office.   EMG and NCV study 01/17/21:  IMPRESSION:    Abnormal study demonstrating: - Mild axonal sensorimotor polyneuropathy.

## 2021-01-21 ENCOUNTER — Encounter: Payer: Self-pay | Admitting: *Deleted

## 2021-01-21 ENCOUNTER — Other Ambulatory Visit: Payer: Self-pay | Admitting: Neurology

## 2021-01-21 DIAGNOSIS — D0512 Intraductal carcinoma in situ of left breast: Secondary | ICD-10-CM

## 2021-01-21 MED ORDER — PRAMIPEXOLE DIHYDROCHLORIDE 0.125 MG PO TABS: 0.1250 mg | ORAL_TABLET | Freq: Every day | ORAL | 1 refills | Status: DC

## 2021-01-21 MED ORDER — PRAMIPEXOLE DIHYDROCHLORIDE 0.5 MG PO TABS: 0.5000 mg | ORAL_TABLET | Freq: Every day | ORAL | 1 refills | Status: DC

## 2021-01-22 NOTE — Progress Notes (Signed)
[place]  [phone] [phone]    ID: Meghan [name] DOB: [birth year]  MR#: [id number]  HCW#:[id number]  Patient Care Team: Marda Stalker, PA-C as PCP - General (Family Medicine) Mauro Kaufmann, RN as Oncology Nurse Navigator Rockwell Germany, RN as Oncology Nurse Navigator Erroll Luna, MD as Consulting Physician (General Surgery) Marchetta Navratil, Virgie Dad, MD as Consulting Physician (Oncology) Kyung Rudd, MD as Consulting Physician (Radiation Oncology) Kathrynn Ducking, MD as Consulting Physician (Neurology) Mcarthur Rossetti, MD as Consulting Physician (Orthopedic Surgery) Irene Shipper, MD as Consulting Physician (Gastroenterology) Kristeen Miss, MD as Consulting Physician (Neurosurgery) Donato Heinz, MD as Consulting Physician (Cardiology) Maisie Fus, MD as Attending Physician (Obstetrics and Gynecology) Luberta Mutter, MD as Consulting Physician (Ophthalmology) Chauncey Cruel, MD OTHER MD:  CHIEF COMPLAINT: noninvasive breast cancer, estrogen receptor positive  CURRENT TREATMENT: Definitive surgery pending   HISTORY OF CURRENT ILLNESS: Meghan [name] has a history of stage pT1b, 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 She is status post lumpectomy on 10/08/2004 (pathology report in Epic), adjuvant radiation therapy, and 5 years of tamoxifen.   She also underwent genetic around that time, and then repeat updated testing in 12/2014. Her results were negative.  More recently, she had routine screening mammography on 01/04/2021 showing a possible abnormality in the left breast. She underwent left diagnostic mammography with tomography at [facility] on 01/11/2021 showing: breast density category B; 5 mm indeterminate calcifications in upper-outer left breast.  Accordingly on 01/14/2021 she proceeded to biopsy of the left breast area in question. The pathology from this procedure ([id number])  showed: ductal carcinoma in situ, intermediate grade with calcifications. Prognostic indicators significant for: estrogen receptor, 100% positive and progesterone receptor, 30% positive, both with strong staining intensity.   Cancer Staging Ductal carcinoma in situ (DCIS) of left breast Staging form: Breast, AJCC 8th Edition - Clinical stage from 01/23/2021: Stage 0 (cTis (DCIS), cN0, cM0, ER+, PR+, HER2: Not Assessed) - Signed by Chauncey Cruel, MD on 01/23/2021 Stage prefix: Initial diagnosis Nuclear grade: G2 Laterality: Left Staged by: Pathologist and managing physician Stage used in treatment planning: Yes National guidelines used in treatment planning: Yes Type of national guideline used in treatment planning: NCCN  The patient's subsequent history is as detailed below.   INTERVAL HISTORY: Meghan [name] was evaluated in the multidisciplinary breast cancer clinic on 01/23/2021. Her case was also presented at the multidisciplinary breast cancer conference on the same day. At that time a preliminary plan was proposed: Breast conserving surgery versus mastectomy, additional radiation if possible, antiestrogens   REVIEW OF SYSTEMS: There were no specific symptoms leading to the original mammogram, which was routinely scheduled. On the provided questionnaire, Meghan [name] reports easy bruising and arthritis. The patient denies unusual headaches, visual changes, nausea, vomiting, stiff neck, dizziness, or gait imbalance. There has been no cough, phlegm production, or pleurisy, no chest pain or pressure, and no change in bowel or bladder habits. The patient denies fever, rash, bleeding, unexplained fatigue or unexplained weight loss. A detailed review of systems was otherwise entirely negative.   COVID 19 VACCINATION STATUS: Pfizer x3   PAST MEDICAL HISTORY: Past Medical History:  Diagnosis Date   Allergy    Arthritis    Breast cancer ([facility]) 2006   IDC+DCIS of Left breast; ER/PR+, Her2-; Ki67=17%    Cataract    REMOVED 05-2020 AND 06-2020 BILAT   Colon polyps    Hyperplastic    Diverticulosis  of colon (without mention of hemorrhage)    Esophageal stricture    GERD (gastroesophageal reflux disease)    History of radiation therapy    History of urinary tract infection    Hypertension    CONTROLLED- OFF MEDS -   Lower back pain    Neuromuscular disorder (HCC)    peripheral nueropathy   Numbness    feet bilat at nighttime   Osteopenia    Personal history of radiation therapy 2006   Restless leg syndrome 03/27/2014   Restless legs syndrome (RLS)    Toe pain, right 09/2014   for 2 wks   Tremor, 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   Head and neck tremor   Urinary urgency     PAST SURGICAL HISTORY: Past Surgical History:  Procedure Laterality Date   BREAST LUMPECTOMY Left 2006   BREAST SURGERY     left 2006   CESAREAN SECTION     COLONOSCOPY     EYE SURGERY  2022   FRACTURE SURGERY  2017   ORIF ANKLE FRACTURE Left 08/24/2015   Procedure: OPEN REDUCTION INTERNAL FIXATION (ORIF) LEFT ANKLE FRACTURE;  Surgeon: Mcarthur Rossetti, MD;  Location: [facility];  Service: Orthopedics;  Laterality: Left;  General with a popliteal block   POLYPECTOMY     HPP 2011    FAMILY HISTORY: Family History  Problem Relation Age of Onset   Heart attack Mother    Varicose Veins Mother    COPD Father    Heart disease Father    Emphysema Father        smoker   Breast cancer Sister 28       mastectomy; metastasis to liver and lung   Cancer Sister    Obesity Brother    Breast cancer Maternal Aunt        dx. late 68s   Cancer Maternal Aunt    Alzheimer's disease Maternal Aunt    Breast cancer Maternal Aunt        dx. 70s   Squamous cell carcinoma Maternal Aunt        recently removed; dx. 95   Cancer Maternal Aunt    Heart disease Maternal Grandmother    Congestive Heart Failure Maternal Grandmother    Heart disease Paternal Grandmother    Breast cancer Cousin 62       genetic testing in approx  2015   Ovarian cancer Cousin        dx. 52s   Esophageal cancer Cousin        not a smoker   Neuropathy Neg Hx    Thyroid disease Neg Hx    Colon cancer Neg Hx    Colon polyps Neg Hx    Rectal cancer Neg Hx    Stomach cancer Neg Hx    Her father died at age 71 from COPD. Her mother died at age 1 from complications following a fall. Meghan [name] has one brother and had one sister. Her sister had a history of breast cancer at age 67 that metastasized to her liver and lung. In addition to her sister, she reports breast cancer in two maternal aunts (in 70-80's) and a cousin (at age 34). One aunt also has a history of squamous cell skin cancer (at age 68). Meghan [name] also reports ovarian cancer in another cousin (in her 76's) and esophageal cancer in another cousin in their 100's.   GYNECOLOGIC HISTORY:  No LMP recorded. Patient is postmenopausal. Menarche: 75 years old Age at first live birth: 75  years old Tate P 2 (twins) LMP 1989 Contraceptive: used pills for 10-15 years HRT never used  Hysterectomy? no BSO? 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 She works part-time at [employer] 3-4 times a week. Husband Fredirick Lathe is retired from [employer]. She lives at home with Winder. She has twin sons Nathaneil Canary and Aaron Edelman, who are 75 years old. Nathaneil Canary is a Agricultural consultant in [place], and Aaron Edelman is a Engineer, structural in [place], [place]. Meghan [name] has 6 grandchildren. She attends [employer].    ADVANCED DIRECTIVES: In the absence of any documentation to the contrary, the patient's spouse is their HCPOA.    HEALTH MAINTENANCE: Social History   Tobacco Use   Smoking status: Never   Smokeless tobacco: Never  Vaping Use   Vaping Use: Never used  Substance Use Topics   Alcohol use: Yes    Comment: rare occ drink wine   Drug use: No     Colonoscopy: 11/2020 (Dr. Henrene Pastor), recall not recommended  PAP: 11/2019  Bone density: 11/2019   Allergies  Allergen Reactions    Lisinopril Swelling    SOB & swelling    Amlodipine Swelling    Swelling face   Flagyl [Metronidazole] Nausea Only   Nsaids Other (See Comments)    CKD - per PCP   Tetracycline Hcl Other (See Comments)    Current Outpatient Medications  Medication Sig Dispense Refill   Cholecalciferol (VITAMIN D3) 2000 units TABS Take 2,000 Units by mouth daily.      clidinium-chlordiazePOXIDE (LIBRAX) 5-2.5 MG capsule Take 1 capsule by mouth as needed. 30 capsule 3   Coenzyme Q10 (CO Q-10) 100 MG CAPS Take 200 mg by mouth daily.     Cranberry-Vitamin C-Probiotic (AZO CRANBERRY) 250-30 MG TABS Take 1 tablet by mouth 2 (two) times daily.      fluticasone (FLONASE) 50 MCG/ACT nasal spray Place 1 spray into both nostrils daily as needed for allergies or rhinitis.      gabapentin (NEURONTIN) 300 MG capsule Take 2 capsules (600 mg total) by mouth at bedtime. 180 capsule 4   Glucosamine HCl (GLUCOSAMINE PO) Take by mouth 2 (two) times daily. 1500/1200 mg     Multiple Vitamin (MULTIVITAMIN) tablet Take 1 tablet by mouth every evening.      Omega-3 Fatty Acids (FISH OIL) 1200 MG CAPS Take 1,200 mg by mouth daily.      pantoprazole (PROTONIX) 40 MG tablet Take 1 tablet (40 mg total) by mouth daily. 90 tablet 3   pramipexole (MIRAPEX) 0.5 MG tablet Take 1 tablet (0.5 mg total) by mouth at bedtime. 30 tablet 1   rosuvastatin (CRESTOR) 10 MG tablet Take 1 tablet (10 mg total) by mouth daily. 90 tablet 1   TURMERIC PO Take 1 capsule by mouth 2 (two) times a day. 1000 mg     pramipexole (MIRAPEX) 0.125 MG tablet Take 1 tablet (0.125 mg total) by mouth at bedtime. 30 tablet 1   No current facility-administered medications for this visit.    OBJECTIVE: White woman who appears stated age  21:   01/23/21 0855  BP: (!) 158/68  Pulse: 76  Resp: 18  Temp: 97.8 F (36.6 C)  SpO2: 99%     Body mass index is 33.27 kg/m.   Wt Readings from Last 3 Encounters:  01/23/21 193 lb 12.8 oz (87.9 kg)  12/21/20 193 lb  4 oz (87.7 kg)  10/31/20 183 lb (83 kg)  ECOG FS:1 - Symptomatic but completely ambulatory  Ocular: Sclerae unicteric, pupils round and equal Ear-nose-throat: Wearing a mask Lymphatic: No cervical or supraclavicular adenopathy Lungs no rales or rhonchi Heart regular rate and rhythm Abd soft, nontender, positive bowel sounds MSK no focal spinal tenderness, no joint edema Neuro: non-focal, well-oriented, appropriate affect Breasts: The right breast is unremarkable.  The left breast is status post recent biopsy as well as remote lumpectomy and MammoSite radiation.  Both axillae are benign.   LAB RESULTS:  CMP     Component Value Date/Time   NA 141 01/23/2021 0816   NA 140 11/09/2019 0945   K 3.9 01/23/2021 0816   CL 108 01/23/2021 0816   CO2 25 01/23/2021 0816   GLUCOSE 124 (H) 01/23/2021 0816   BUN 16 01/23/2021 0816   BUN 14 11/09/2019 0945   CREATININE 0.96 01/23/2021 0816   CALCIUM 10.2 01/23/2021 0816   PROT 7.1 01/23/2021 0816   PROT 6.7 12/21/2020 0824   ALBUMIN 3.6 01/23/2021 0816   AST 24 01/23/2021 0816   ALT 19 01/23/2021 0816   ALKPHOS 60 01/23/2021 0816   BILITOT 0.6 01/23/2021 0816   GFRNONAA >60 01/23/2021 0816   GFRAA 69 11/09/2019 0945    No results found for: TOTALPROTELP, ALBUMINELP, A1GS, A2GS, BETS, BETA2SER, GAMS, MSPIKE, SPEI  Lab Results  Component Value Date   WBC 5.2 01/23/2021   NEUTROABS 2.4 01/23/2021   HGB 13.6 01/23/2021   HCT 41.3 01/23/2021   MCV 91.8 01/23/2021   PLT 206 01/23/2021    Lab Results  Component Value Date   LABCA2 28 09/05/2009    No components found for: GTXMIW803  No results for input(s): INR in the last 168 hours.  Lab Results  Component Value Date   LABCA2 28 09/05/2009    No results found for: OZY248  No results found for: GNO037  No results found for: CWU889  No results found for: CA2729  No components found for: HGQUANT  No results found for: CEA1 / No results found for: CEA1   No  results found for: AFPTUMOR  No results found for: CHROMOGRNA  No results found for: KPAFRELGTCHN, LAMBDASER, KAPLAMBRATIO (kappa/lambda light chains)  No results found for: HGBA, HGBA2QUANT, HGBFQUANT, HGBSQUAN (Hemoglobinopathy evaluation)   Lab Results  Component Value Date   LDH 165 09/05/2009    No results found for: IRON, TIBC, IRONPCTSAT (Iron and TIBC)  No results found for: FERRITIN  Urinalysis    Component Value Date/Time   BILIRUBINUR negative 07/02/2012 1528   PROTEINUR negative 07/02/2012 1528   UROBILINOGEN 0.2 07/02/2012 1528   NITRITE negative 07/02/2012 1528   LEUKOCYTESUR Negative 07/02/2012 1528     STUDIES: MM Digital Diagnostic Unilat L  Result Date: 01/11/2021 CLINICAL DATA:  75 year old female presenting as a recall from screening for left breast calcifications. EXAM: DIGITAL DIAGNOSTIC UNILATERAL LEFT MAMMOGRAM WITH CAD TECHNIQUE: Left digital diagnostic mammography was performed. Mammographic images were processed with CAD. COMPARISON:  Previous exam(s). ACR Breast Density Category b: There are scattered areas of fibroglandular density. FINDINGS: Spot 2D magnification views and full field mL 2D views of the left breast were performed. There is persistence of linear calcifications spanning 5 mm in the upper outer left breast. No associated mass or distortion. IMPRESSION: Indeterminate calcifications in the upper outer left breast spanning 5 mm. RECOMMENDATION: Stereotactic core needle biopsy of the left breast calcifications. I have discussed the findings and recommendations with the patient who agrees to proceed with biopsy. The  patient be scheduled for the biopsy appointment prior to leaving the office today. BI-RADS CATEGORY  4: Suspicious. Electronically Signed   By: Audie Pinto M.D.   On: 01/11/2021 16:25  NCV with EMG(electromyography)  Result Date: 01/17/2021 Penni Bombard, MD     01/18/2021 12:19 PM  [facility]  (NERVE CONDUCTION STUDY) WITH EMG (ELECTROMYOGRAPHY) REPORT STUDY DATE: 01/17/21 PATIENT NAME: TYA HAUGHEY DOB: January 28, 1946 MRN: [id number] ORDERING CLINICIAN: Kathrynn Ducking, MD TECHNOLOGIST: Sherre Scarlet ELECTROMYOGRAPHER: Earlean Polka. Penumalli, MD CLINICAL INFORMATION: 75 year old female with bilateral foot numbness and low back pain. FINDINGS: NERVE CONDUCTION STUDY: Bilateral peroneal motor responses are normal. Bilateral tibial motor responses have decreased amplitudes and normal conduction velocities. Bilateral sural and superficial peroneal sensory responses have decreased amplitudes and normal peak latencies. Bilateral tibial F wave latencies are normal. NEEDLE ELECTROMYOGRAPHY: Needle examination of right lower extremity is normal. 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 INTERPRETING PHYSICIAN: Penni Bombard, MD Certified in Neurology, Neurophysiology and Neuroimaging [facility] [address], [place], [place] [address] [phone] [facility]   Nerve / Sites Muscle Latency Ref. Amplitude Ref. Rel Amp Segments Distance Velocity Ref. Area   ms ms mV mV %  cm m/s m/s mVms L Peroneal - EDB    Ankle EDB 3.9 ?6.5 2.0 ?2.0 100 Ankle - EDB 9   8.0    Fib head EDB 9.6  1.7  87.3 Fib head - Ankle 25 44 ?44 5.6    Pop fossa EDB 11.9  1.9  110 Pop fossa - Fib head 10 44 ?44 6.9        Pop fossa - Ankle     R Peroneal - EDB    Ankle EDB 4.8 ?6.5 3.3 ?2.0 100 Ankle - EDB 9   11.5    Fib head EDB 10.7  2.9  86.6 Fib head - Ankle 26 44 ?44 10.3    Pop fossa EDB 12.9  2.8  95.2 Pop fossa - Fib head 10 45 ?44 10.6        Pop fossa - Ankle     L Tibial - AH    Ankle AH 4.1 ?5.8 2.3 ?4.0 100 Ankle - AH 9   6.3    Pop fossa AH 13.0  1.1  48.4 Pop fossa - Ankle 37 42 ?41 5.6 R Tibial - AH    Ankle AH 3.8 ?5.8 2.5 ?4.0 100 Ankle - AH 9   11.4    Pop fossa AH 11.8  2.0  79.7 Pop fossa - Ankle 36 45 ?41 13.6           SNC   Nerve / Sites Rec. Site Peak Lat Ref.  Amp  Ref. Segments Distance   ms ms V V  cm L Sural - Ankle (Calf)    Calf Ankle 3.4 ?4.4 3 ?6 Calf - Ankle 14 R Sural - Ankle (Calf)    Calf Ankle 2.8 ?4.4 3 ?6 Calf - Ankle 14 L Superficial peroneal - Ankle    Lat leg Ankle 3.1 ?4.4 4 ?6 Lat leg - Ankle 14 R Superficial peroneal - Ankle    Lat leg Ankle 3.3 ?4.4 2 ?6 Lat leg - Ankle 14           F  Wave   Nerve F Lat Ref.  ms ms L Tibial - AH 52.6 ?56.0 R Tibial - AH 53.8 ?56.0  EMG Summary Table   Spontaneous MUAP Recruitment Muscle IA Fib PSW Fasc Other Amp Dur. Poly Pattern R. Vastus medialis Normal None None None _______ Normal Normal Normal Normal R. Tibialis anterior Normal None None None _______ Normal Normal Normal Normal R. Gastrocnemius (Medial head) Normal None None None _______ Normal Normal Normal Normal    MM 3D SCREEN BREAST BILATERAL  Result Date: 01/08/2021 CLINICAL DATA:  Screening. EXAM: DIGITAL SCREENING BILATERAL MAMMOGRAM WITH TOMOSYNTHESIS AND CAD TECHNIQUE: Bilateral screening digital craniocaudal and mediolateral oblique mammograms were obtained. Bilateral screening digital breast tomosynthesis was performed. The images were evaluated with computer-aided detection. COMPARISON:  Previous exam(s). ACR Breast Density Category b: There are scattered areas of fibroglandular density. FINDINGS: In the left breast, calcifications warrant further evaluation. In the right breast, no findings suspicious for malignancy. IMPRESSION: Further evaluation is suggested for calcifications in the left breast. RECOMMENDATION: Diagnostic mammogram of the left breast. (Code:[id number]) The patient will be contacted regarding the findings, and additional imaging will be scheduled. BI-RADS CATEGORY  0: Incomplete. Need additional imaging evaluation and/or prior mammograms for comparison. Electronically Signed   By: Ammie Ferrier M.D.   On: 01/08/2021 09:06   MM CLIP PLACEMENT LEFT  Result Date: 01/14/2021 CLINICAL DATA:  Confirmation of clip placement  after stereotactic tomosynthesis core needle biopsy in indeterminate 6 mm group of calcifications involving the UPPER OUTER QUADRANT of the LEFT breast at middle depth. EXAM: 2D and 3D DIAGNOSTIC LEFT MAMMOGRAM POST STEREOTACTIC TOMOSYNTHESIS BIOPSY COMPARISON:  Previous exam(s). FINDINGS: Tomosynthesis and synthesized full field CC and mediolateral images with tomosynthesis were obtained following stereotactic tomosynthesis guided biopsy of calcifications involving the UPPER OUTER QUADRANT of the LEFT breast at middle depth. The X shaped biopsy marking clip is appropriately positioned at the site of the biopsied calcifications. All of the calcifications within the group were removed with the biopsy. Expected post biopsy changes are present without evidence of hematoma. IMPRESSION: Appropriate positioning of the X shaped biopsy marking clip at the site of the biopsied calcifications in the UPPER OUTER QUADRANT of the LEFT breast at middle depth. All of the calcifications in the group were removed with the biopsy. Final Assessment: Post Procedure Mammograms for Marker Placement Electronically Signed   By: Evangeline Dakin M.D.   On: 01/14/2021 11:27  MM LT BREAST BX W LOC DEV 1ST LESION IMAGE BX SPEC STEREO GUIDE  Addendum Date: 01/15/2021   ADDENDUM REPORT: 01/15/2021 15:01 ADDENDUM: Pathology revealed DUCTAL CARCINOMA IN SITU, INTERMEDIATE GRADE WITH CALCIFICATIONS of the LEFT breast, upper outer, (X clip). This was found to be concordant by Dr. Peggye Fothergill. Pathology results were discussed with the patient by telephone. The patient reported doing well after the biopsy with tenderness at the site. Post biopsy instructions and care were reviewed and questions were answered. The patient was encouraged to call [facility] for any additional concerns. My direct phone number was provided. The patient was referred to [facility] on January 23, 2021, AM clinic per request, to see Dr. Gunnar Bulla Corneilus Heggie. NOTE: There is a tiny group of similar calcifications in the LEFT breast anterior and inferior to the biopsied calcifications that need stereotactic tomosynthesis biopsy if breast-sparing surgery is contemplated. Pathology results reported by Terie Purser, RN on 01/15/2021. Electronically Signed   By: Evangeline Dakin M.D.   On: 01/15/2021 15:01   Result Date: 01/15/2021 CLINICAL DATA:  Screening detected indeterminate 6 mm group calcifications  involving the UPPER OUTER QUADRANT of the LEFT breast at middle depth. Personal history of malignant lumpectomy of the LEFT breast UPPER OUTER QUADRANT at posterior depth in 2006 with adjuvant radiation therapy. EXAM: LEFT BREAST STEREOTACTIC CORE NEEDLE BIOPSY COMPARISON:  Previous exams. FINDINGS: The patient and I discussed the procedure of stereotactic-guided biopsy including benefits and alternatives. We discussed the high likelihood of a successful procedure. We discussed the risks of the procedure including infection, bleeding, tissue injury, clip migration, and inadequate sampling. Informed written consent was given. The usual time out protocol was performed immediately prior to the procedure. Lesion quadrant: UPPER OUTER QUADRANT. Using sterile technique with chlorhexidine as skin antisepsis, 1% lidocaine and 1% lidocaine with epinephrine as local anesthetic, under stereotactic tomosynthesis guidance, a 9 gauge Brevera vacuum assisted device was used to perform core needle biopsy of calcifications involving the UPPER OUTER QUADRANT of the LEFT breast using a superior approach. Specimen radiograph was performed showing calcifications in all 4 core samples. Specimens with calcifications are identified for pathology. At the conclusion of the procedure, an X shaped tissue marker clip was deployed into the biopsy cavity. Follow-up 2-view mammogram was performed and dictated  separately. IMPRESSION: Stereotactic-guided biopsy of indeterminate calcifications involving the UPPER OUTER QUADRANT of the LEFT breast at middle. No apparent complications. Electronically Signed: By: Evangeline Dakin M.D. On: 01/14/2021 11:21    ELIGIBLE FOR AVAILABLE RESEARCH PROTOCOL: No  ASSESSMENT: 75 y.o. [place] woman  (1) s/p left lumpectomy 10/08/2004 for a pT1b pN0, stage IA invasive ductal carcinoma, grade 2, estrogen and progesterone receptor positive, HER2 not amplified, with an MIB-1 of 17%  (A) status post adjuvant MammoSite radiation  (B) status post tamoxifen for 5 years  (2) genetics testing 2006 updated 2018 negative  (3) status post left breast biopsy 01/14/2021 for ductal carcinoma in situ, grade 2, estrogen and progesterone receptor positive.  (4) adjuvant radiation to follow as appropriate  (5) antiestrogens: Anastrozole    PLAN: I met today with Meghan [name] to review her new diagnosis. Specifically we discussed the biology of her breast cancer, its diagnosis, staging, treatment  options and prognosis.Sheylin understands that in noninvasive ductal carcinoma, also called ductal carcinoma in situ ("DCIS") the breast cancer cells remain trapped in the ducts were they started. They cannot travel to a vital organ. For that reason these cancers in themselves are not life-threatening.  If the whole breast is removed then all the ducts are removed and since the cancer cells are trapped in the ducts, the cure rate with mastectomy for noninvasive breast cancer is approximately 99%. Nevertheless we recommend lumpectomy, because there is no survival advantage to mastectomy and because the cosmetic result is generally superior with breast conservation.  Since the patient is keeping her breasts, there will be some risk of recurrence. The recurrence can only be in the same breast since, again, the cells are trapped in the ducts. There is no connection from one breast to the other. The  risk of local recurrence is cut by more than half with radiation, which is standard in this situation.  In estrogen receptor positive cancers like Meghan [name]'s, anti-estrogens can also be considered. They will further reduce the risk of recurrence by one half. In addition anti-estrogens will lower the risk of a new breast cancer developing in either breast, also by one half. That risk otherwise approaches 1% per year.   Accordingly the overall plan is for surgery, followed by radiation, then a discussion of anti-estrogens.  Specifically, since she had  tamoxifen previously, she will receive anastrozole adjuvantly this time.  Serita has a good understanding of the overall plan. She agrees with it. She knows the goal of treatment in her case is cure. She will call with any problems that may develop before her next visit here.  Total encounter time 55 minutes.Sarajane Jews C. Roxann Vierra, MD 01/23/2021 11:04 AM Medical Oncology and Hematology [facility], [place] [address] Tel. [phone]    Fax. [phone]   This document serves as a record of services personally performed by Lurline Del, MD. It was created on his behalf by Wilburn Mylar, a trained medical scribe. The creation of this record is based on the scribe's personal observations and the provider's statements to them.   I, Lurline Del MD, have reviewed the above documentation for accuracy and completeness, and I agree with the above.   *Total Encounter Time as defined by the Centers for Medicare and Medicaid Services includes, in addition to the face-to-face time of a patient visit (documented in the note above) non-face-to-face time: obtaining and reviewing outside history, ordering and reviewing medications, tests or procedures, care coordination (communications with other health care professionals or caregivers) and documentation in the medical record.

## 2021-01-23 ENCOUNTER — Inpatient Hospital Stay: Payer: Medicare Other | Attending: Oncology

## 2021-01-23 ENCOUNTER — Other Ambulatory Visit: Payer: Self-pay

## 2021-01-23 ENCOUNTER — Ambulatory Visit: Payer: Medicare Other | Admitting: Physical Therapy

## 2021-01-23 ENCOUNTER — Ambulatory Visit
Admission: RE | Admit: 2021-01-23 | Discharge: 2021-01-23 | Disposition: A | Discharge status: Home / Self Care | Payer: Medicare Other | Source: Ambulatory Visit | Attending: Radiation Oncology | Admitting: Radiation Oncology

## 2021-01-23 ENCOUNTER — Ambulatory Visit: Payer: Self-pay | Admitting: Surgery

## 2021-01-23 ENCOUNTER — Encounter: Payer: Self-pay | Admitting: Oncology

## 2021-01-23 ENCOUNTER — Encounter: Payer: Self-pay | Admitting: *Deleted

## 2021-01-23 ENCOUNTER — Inpatient Hospital Stay (HOSPITAL_BASED_OUTPATIENT_CLINIC_OR_DEPARTMENT_OTHER): Payer: Medicare Other | Admitting: Oncology

## 2021-01-23 ENCOUNTER — Encounter: Payer: Self-pay | Admitting: General Practice

## 2021-01-23 VITALS — BP 158/68 | HR 76 | Temp 97.8°F | Resp 18 | Ht 64.0 in | Wt 193.8 lb

## 2021-01-23 DIAGNOSIS — Z79811 Long term (current) use of aromatase inhibitors: Secondary | ICD-10-CM | POA: Insufficient documentation

## 2021-01-23 DIAGNOSIS — Z79899 Other long term (current) drug therapy: Secondary | ICD-10-CM | POA: Diagnosis not present

## 2021-01-23 DIAGNOSIS — Z170 Estrogen receptor positive status [ER+]: Secondary | ICD-10-CM | POA: Insufficient documentation

## 2021-01-23 DIAGNOSIS — C50412 Malignant neoplasm of upper-outer quadrant of left female breast: Secondary | ICD-10-CM

## 2021-01-23 DIAGNOSIS — D0512 Intraductal carcinoma in situ of left breast: Secondary | ICD-10-CM

## 2021-01-23 DIAGNOSIS — M8580 Other specified disorders of bone density and structure, unspecified site: Secondary | ICD-10-CM

## 2021-01-23 DIAGNOSIS — C50919 Malignant neoplasm of unspecified site of unspecified female breast: Secondary | ICD-10-CM

## 2021-01-23 LAB — CMP (CANCER CENTER ONLY)
ALT: 19 U/L (ref 0–44)
AST: 24 U/L (ref 15–41)
Albumin: 3.6 g/dL (ref 3.5–5.0)
Alkaline Phosphatase: 60 U/L (ref 38–126)
Anion gap: 8 (ref 5–15)
BUN: 16 mg/dL (ref 8–23)
CO2: 25 mmol/L (ref 22–32)
Calcium: 10.2 mg/dL (ref 8.9–10.3)
Chloride: 108 mmol/L (ref 98–111)
Creatinine: 0.96 mg/dL (ref 0.44–1.00)
GFR, Estimated: >60 mL/min (ref >60)
Glucose, Bld: 124 mg/dL — ABNORMAL HIGH (ref 70–99)
Potassium: 3.9 mmol/L (ref 3.5–5.1)
Sodium: 141 mmol/L (ref 135–145)
Total Bilirubin: 0.6 mg/dL (ref 0.3–1.2)
Total Protein: 7.1 g/dL (ref 6.5–8.1)

## 2021-01-23 LAB — CBC WITH DIFFERENTIAL (CANCER CENTER ONLY)
Abs Immature Granulocytes: 0.03 10*3/uL (ref 0.00–0.07)
Basophils Absolute: 0 10*3/uL (ref 0.0–0.1)
Basophils Relative: 1 %
Eosinophils Absolute: 0.2 10*3/uL (ref 0.0–0.5)
Eosinophils Relative: 4 %
HCT: 41.3 % (ref 36.0–46.0)
Hemoglobin: 13.6 g/dL (ref 12.0–15.0)
Immature Granulocytes: 1 %
Lymphocytes Relative: 35 %
Lymphs Abs: 1.8 10*3/uL (ref 0.7–4.0)
MCH: 30.2 pg (ref 26.0–34.0)
MCHC: 32.9 g/dL (ref 30.0–36.0)
MCV: 91.8 fL (ref 80.0–100.0)
Monocytes Absolute: 0.7 10*3/uL (ref 0.1–1.0)
Monocytes Relative: 13 %
Neutro Abs: 2.4 10*3/uL (ref 1.7–7.7)
Neutrophils Relative %: 46 %
Platelet Count: 206 10*3/uL (ref 150–400)
RBC: 4.5 MIL/uL (ref 3.87–5.11)
RDW: 13 % (ref 11.5–15.5)
WBC Count: 5.2 10*3/uL (ref 4.0–10.5)
nRBC: 0 % (ref 0.0–0.2)

## 2021-01-23 LAB — GENETIC SCREENING ORDER

## 2021-01-23 NOTE — Progress Notes (Signed)
[place] Social Work  Initial Assessment  Assessment performed by [facility], note entered by Eusebio Me LCSW as intern does not have Epic access at this time.  Meghan [name] is a 75 y.o. year old female presenting alone. Social Work was referred by [facility] for assessment of psychosocial needs.   SDOH (Social Determinants of Health) assessments performed: Yes SDOH Interventions    Flowsheet Row Most Recent Value  SDOH Interventions   Food Insecurity Interventions Intervention Not Indicated  Financial Strain Interventions Intervention Not Indicated  Housing Interventions Intervention Not Indicated  Transportation Interventions Intervention Not Indicated       Distress Screen completed: Yes ONCBCN DISTRESS SCREENING 01/23/2021  Distress experienced in past week (1-10) 5  Emotional problem type Adjusting to illness      Family/Social Information:  Housing Arrangement: patient lives with husband, also has  one son who lives locally. Family members/support persons in your life? Family and Friends/Colleagues. Pt has 3 friends of 72 years who are very supportive. Transportation concerns: no  Employment: Retired and working part time at [employer] source: Employment and Paediatric nurse concerns: No Type of concern: None Food access concerns: no Religious or spiritual practice: Patient has a Camera operator but does not attend regularly. Medication Concerns: no  Services Currently in place:  N/A  Coping/ Adjustment to diagnosis: Patient understands treatment plan and what happens next? yes Concerns about diagnosis and/or treatment:  Patient had breast cancer 16 years ago and stated that she feels shocked that it has returned now. Patient reported stressors: Adjusting to my illness Patient enjoys being outside, time with family/ friends, and going camping .  Current coping skills/ strengths: Capable of independent living , Supportive  family/friends , and Other: has successfully completed prior cancer treatment and generally knows what to expect with treatment plan.    SUMMARY: Current SDOH Barriers:  None identified at this time.  Social Work Clinical Goal(s):  None at this time.  Interventions: Discussed common feeling and emotions when being diagnosed with cancer, and the importance of support during treatment Informed patient of the support team roles and support services at [facility] Provided CSW contact information and encouraged patient to call with any questions or concerns    Follow Up Plan:  BSW Intern will call patient in a few weeks to check in. Patient verbalizes understanding of plan: Yes   Rosary Lively, [place] , LCSW

## 2021-01-23 NOTE — Progress Notes (Addendum)
Radiation Oncology         [phone] ________________________________  Name: Meghan [name]        MRN: [id number]  Date of Service: 01/23/2021 DOB: [birth year]  CC:Marda Stalker, PA-C  Cornett, Marcello Moores, MD     REFERRING PHYSICIAN: Erroll Luna, MD   DIAGNOSIS: The encounter diagnosis was Malignant neoplasm of upper-outer quadrant of left female breast, unspecified estrogen receptor status ([place]).   HISTORY OF PRESENT ILLNESS: Meghan [name] is a 75 y.o. female seen in the multidisciplinary breast clinic for a new diagnosis of left breast cancer. 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 Recently, the patient was noted to have screening detected calcifications in the left breast and diagnostic mammogram showed a 5 mm group of indeterminate calcifications in the upper outer quadrant of the left breast. 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 She's seen today to discuss treatment of her cancer.    PREVIOUS RADIATION THERAPY: Yes   2006:  The left breast was treated following lumpectomy with mammosite with Dr. Sondra Come.   PAST MEDICAL HISTORY:  Past Medical History:  Diagnosis Date   Allergy    Arthritis    Breast cancer ([place]) 2006   IDC+DCIS of Left breast; ER/PR+, Her2-; Ki67=17%   Cataract    REMOVED 05-2020 AND 06-2020 BILAT   Colon polyps    Hyperplastic    Diverticulosis of colon (without mention of hemorrhage)    Esophageal stricture    GERD (gastroesophageal reflux disease)    History of radiation therapy    History of urinary tract infection    Hypertension    CONTROLLED- OFF MEDS -   Lower back pain    Neuromuscular disorder (HCC)    peripheral nueropathy   Numbness    feet bilat at nighttime   Osteopenia    Personal history of radiation  therapy 2006   Restless leg syndrome 03/27/2014   Restless legs syndrome (RLS)    Toe pain, right 09/2014   for 2 wks   Tremor, 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   Head and neck tremor   Urinary urgency        PAST SURGICAL HISTORY: Past Surgical History:  Procedure Laterality Date   BREAST LUMPECTOMY Left 2006   BREAST SURGERY     left 2006   CESAREAN SECTION     COLONOSCOPY     EYE SURGERY  2022   FRACTURE SURGERY  2017   ORIF ANKLE FRACTURE Left 08/24/2015   Procedure: OPEN REDUCTION INTERNAL FIXATION (ORIF) LEFT ANKLE FRACTURE;  Surgeon: Mcarthur Rossetti, MD;  Location: [facility];  Service: Orthopedics;  Laterality: Left;  General with a popliteal block   POLYPECTOMY     HPP 2011     FAMILY HISTORY:  Family History  Problem Relation Age of Onset   Heart attack Mother    Varicose Veins Mother    COPD Father    Heart disease Father    Emphysema Father        smoker   Breast cancer Sister 62       mastectomy; metastasis to liver and lung   Cancer Sister    Obesity Brother    Breast cancer Maternal Aunt        dx. late 49s   Cancer Maternal Aunt    Alzheimer's disease  Maternal Aunt    Breast cancer Maternal Aunt        dx. 70s   Squamous cell carcinoma Maternal Aunt        recently removed; dx. 95   Cancer Maternal Aunt    Heart disease Maternal Grandmother    Congestive Heart Failure Maternal Grandmother    Heart disease Paternal Grandmother    Breast cancer Cousin 62       genetic testing in approx 2015   Ovarian cancer Cousin        dx. 36s   Esophageal cancer Cousin        not a smoker   Neuropathy Neg Hx    Thyroid disease Neg Hx    Colon cancer Neg Hx    Colon polyps Neg Hx    Rectal cancer Neg Hx    Stomach cancer Neg Hx      SOCIAL HISTORY:  reports that she has never smoked. She has never used smokeless tobacco. She reports current alcohol use. She reports that she does not use drugs. The patient is married and lives in [place]. 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    ALLERGIES: Lisinopril, Amlodipine, Flagyl [metronidazole], Nsaids, and Tetracycline hcl   MEDICATIONS:  Current Outpatient Medications  Medication Sig Dispense Refill   Cholecalciferol (VITAMIN D3) 2000 units TABS Take 2,000 Units by mouth daily.      clidinium-chlordiazePOXIDE (LIBRAX) 5-2.5 MG capsule Take 1 capsule by mouth as needed. 30 capsule 3   Coenzyme Q10 (CO Q-10) 100 MG CAPS Take 200 mg by mouth daily.     Cranberry-Vitamin C-Probiotic (AZO CRANBERRY) 250-30 MG TABS Take 1 tablet by mouth 2 (two) times daily.      fluticasone (FLONASE) 50 MCG/ACT nasal spray Place 1 spray into both nostrils daily as needed for allergies or rhinitis.      gabapentin (NEURONTIN) 300 MG capsule Take 2 capsules (600 mg total) by mouth at bedtime. 180 capsule 4   Glucosamine HCl (GLUCOSAMINE PO) Take by mouth 2 (two) times daily. 1500/1200 mg     Multiple Vitamin (MULTIVITAMIN) tablet Take 1 tablet by mouth every evening.      Omega-3 Fatty Acids (FISH OIL) 1200 MG CAPS Take 1,200 mg by mouth daily.      pantoprazole (PROTONIX) 40 MG tablet Take 1 tablet (40 mg total) by mouth daily. 90 tablet 3   pramipexole (MIRAPEX) 0.125 MG tablet Take 1 tablet (0.125 mg total) by mouth at bedtime. 30 tablet 1   pramipexole (MIRAPEX) 0.5 MG tablet Take 1 tablet (0.5 mg total) by mouth at bedtime. 30 tablet 1   rosuvastatin (CRESTOR) 10 MG tablet Take 1 tablet (10 mg total) by mouth daily. 90 tablet 1   TURMERIC PO Take 1 capsule by mouth 2 (two) times a day. 1000 mg     No current facility-administered medications for this encounter.     REVIEW OF SYSTEMS: On review of systems, the patient reports that she is doing well. She does have soreness of the breast from prior mammosite and reports some soreness in the breast since her biopsy. She does have joint pain from arthritis. No other complaints are verbalized.      PHYSICAL EXAM:  Wt Readings  from Last 3 Encounters:  01/23/21 193 lb 12.8 oz (87.9 kg)  12/21/20 193 lb 4 oz (87.7 kg)  10/31/20 183 lb (83 kg)   Temp Readings from Last 3 Encounters:  01/23/21 97.8 F (36.6 C) (Temporal)  11/14/20 (!) 97.5 F (36.4 C)  05/23/20 98.1 F (36.7 C) (Oral)   BP Readings from Last 3 Encounters:  01/23/21 (!) 158/68  12/21/20 (!) 144/79  11/14/20 (!) 143/77   Pulse Readings from Last 3 Encounters:  01/23/21 76  12/21/20 82  11/14/20 77    In general this is a well appearing caucasian female in no acute distress. She's alert and oriented x4 and appropriate throughout the examination. Cardiopulmonary assessment is negative for acute distress and she exhibits normal effort. Bilateral breast exam is deferred.    ECOG = 1  0 - Asymptomatic (Fully active, able to carry on all predisease activities without restriction)  1 - Symptomatic but completely ambulatory (Restricted in physically strenuous activity but ambulatory and able to carry out work of a light or sedentary nature. For example, light housework, office work)  2 - Symptomatic, <50% in bed during the day (Ambulatory and capable of all self care but unable to carry out any work activities. Up and about more than 50% of waking hours)  3 - Symptomatic, >50% in bed, but not bedbound (Capable of only limited self-care, confined to bed or chair 50% or more of waking hours)  4 - Bedbound (Completely disabled. Cannot carry on any self-care. Totally confined to bed or chair)  5 - Death   Eustace Pen MM, Creech RH, Tormey DC, et al. [phone]). "Toxicity and response criteria of the [facility]". [place] Oncol. 5 (6): 649-55    LABORATORY DATA:  Lab Results  Component Value Date   WBC 5.2 01/23/2021   HGB 13.6 01/23/2021   HCT 41.3 01/23/2021   MCV 91.8 01/23/2021   PLT 206 01/23/2021   Lab Results  Component Value Date   NA 141 01/23/2021   K 3.9 01/23/2021   CL 108 01/23/2021   CO2 25 01/23/2021    Lab Results  Component Value Date   ALT 19 01/23/2021   AST 24 01/23/2021   ALKPHOS 60 01/23/2021   BILITOT 0.6 01/23/2021      RADIOGRAPHY: MM Digital Diagnostic Unilat L  Result Date: 01/11/2021 CLINICAL DATA:  75 year old female presenting as a recall from screening for left breast calcifications. EXAM: DIGITAL DIAGNOSTIC UNILATERAL LEFT MAMMOGRAM WITH CAD TECHNIQUE: Left digital diagnostic mammography was performed. Mammographic images were processed with CAD. COMPARISON:  Previous exam(s). ACR Breast Density Category b: There are scattered areas of fibroglandular density. FINDINGS: Spot 2D magnification views and full field mL 2D views of the left breast were performed. There is persistence of linear calcifications spanning 5 mm in the upper outer left breast. No associated mass or distortion. IMPRESSION: Indeterminate calcifications in the upper outer left breast spanning 5 mm. RECOMMENDATION: Stereotactic core needle biopsy of the left breast calcifications. I have discussed the findings and recommendations with the patient who agrees to proceed with biopsy. The patient be scheduled for the biopsy appointment prior to leaving the office today. BI-RADS CATEGORY  4: Suspicious. Electronically Signed   By: Audie Pinto M.D.   On: 01/11/2021 16:25  NCV with EMG(electromyography)  Result Date: 01/17/2021 Penni Bombard, MD     01/18/2021 12:19 PM  [facility] (NERVE CONDUCTION STUDY) WITH EMG (ELECTROMYOGRAPHY) REPORT STUDY DATE: 01/17/21 PATIENT NAME: Meghan [name] DOB: July 06, 1945 MRN: [id number] ORDERING CLINICIAN: Kathrynn Ducking, MD TECHNOLOGIST: Sherre Scarlet ELECTROMYOGRAPHER: Earlean Polka. Penumalli, MD CLINICAL INFORMATION: 75 year old female with bilateral foot numbness and low  back pain. FINDINGS: NERVE CONDUCTION STUDY: Bilateral peroneal motor responses are normal. Bilateral tibial motor responses have decreased amplitudes and normal conduction velocities.  Bilateral sural and superficial peroneal sensory responses have decreased amplitudes and normal peak latencies. Bilateral tibial F wave latencies are normal. NEEDLE ELECTROMYOGRAPHY: Needle examination of right lower extremity is normal. 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 INTERPRETING PHYSICIAN: Penni Bombard, MD Certified in Neurology, Neurophysiology and Neuroimaging [facility] [address], [place],  [address] [phone] [facility]   Nerve / Sites Muscle Latency Ref. Amplitude Ref. Rel Amp Segments Distance Velocity Ref. Area   ms ms mV mV %  cm m/s m/s mVms L Peroneal - EDB    Ankle EDB 3.9 ?6.5 2.0 ?2.0 100 Ankle - EDB 9   8.0    Fib head EDB 9.6  1.7  87.3 Fib head - Ankle 25 44 ?44 5.6    Pop fossa EDB 11.9  1.9  110 Pop fossa - Fib head 10 44 ?44 6.9        Pop fossa - Ankle     R Peroneal - EDB    Ankle EDB 4.8 ?6.5 3.3 ?2.0 100 Ankle - EDB 9   11.5    Fib head EDB 10.7  2.9  86.6 Fib head - Ankle 26 44 ?44 10.3    Pop fossa EDB 12.9  2.8  95.2 Pop fossa - Fib head 10 45 ?44 10.6        Pop fossa - Ankle     L Tibial - AH    Ankle AH 4.1 ?5.8 2.3 ?4.0 100 Ankle - AH 9   6.3    Pop fossa AH 13.0  1.1  48.4 Pop fossa - Ankle 37 42 ?41 5.6 R Tibial - AH    Ankle AH 3.8 ?5.8 2.5 ?4.0 100 Ankle - AH 9   11.4    Pop fossa AH 11.8  2.0  79.7 Pop fossa - Ankle 36 45 ?41 13.6           SNC   Nerve / Sites Rec. Site Peak Lat Ref.  Amp Ref. Segments Distance   ms ms V V  cm L Sural - Ankle (Calf)    Calf Ankle 3.4 ?4.4 3 ?6 Calf - Ankle 14 R Sural - Ankle (Calf)    Calf Ankle 2.8 ?4.4 3 ?6 Calf - Ankle 14 L Superficial peroneal - Ankle    Lat leg Ankle 3.1 ?4.4 4 ?6 Lat leg - Ankle 14 R Superficial peroneal - Ankle    Lat leg Ankle 3.3 ?4.4 2 ?6 Lat leg - Ankle 14           F  Wave   Nerve F Lat Ref.  ms ms L Tibial - AH 52.6 ?56.0 R Tibial - AH 53.8 ?56.0       EMG Summary Table   Spontaneous MUAP Recruitment Muscle IA Fib PSW  Fasc Other Amp Dur. Poly Pattern R. Vastus medialis Normal None None None _______ Normal Normal Normal Normal R. Tibialis anterior Normal None None None _______ Normal Normal Normal Normal R. Gastrocnemius (Medial head) Normal None None None _______ Normal Normal Normal Normal    MM 3D SCREEN BREAST BILATERAL  Result Date: 01/08/2021 CLINICAL DATA:  Screening. EXAM: DIGITAL SCREENING BILATERAL MAMMOGRAM WITH TOMOSYNTHESIS AND CAD TECHNIQUE: Bilateral screening digital craniocaudal and mediolateral oblique mammograms were obtained. Bilateral screening digital breast tomosynthesis was performed. The images  were evaluated with computer-aided detection. COMPARISON:  Previous exam(s). ACR Breast Density Category b: There are scattered areas of fibroglandular density. FINDINGS: In the left breast, calcifications warrant further evaluation. In the right breast, no findings suspicious for malignancy. IMPRESSION: Further evaluation is suggested for calcifications in the left breast. RECOMMENDATION: Diagnostic mammogram of the left breast. (Code:[id number]) The patient will be contacted regarding the findings, and additional imaging will be scheduled. BI-RADS CATEGORY  0: Incomplete. Need additional imaging evaluation and/or prior mammograms for comparison. Electronically Signed   By: Ammie Ferrier M.D.   On: 01/08/2021 09:06   MM CLIP PLACEMENT LEFT  Result Date: 01/14/2021 CLINICAL DATA:  Confirmation of clip placement after stereotactic tomosynthesis core needle biopsy in indeterminate 6 mm group of calcifications involving the UPPER OUTER QUADRANT of the LEFT breast at middle depth. EXAM: 2D and 3D DIAGNOSTIC LEFT MAMMOGRAM POST STEREOTACTIC TOMOSYNTHESIS BIOPSY COMPARISON:  Previous exam(s). FINDINGS: Tomosynthesis and synthesized full field CC and mediolateral images with tomosynthesis were obtained following stereotactic tomosynthesis guided biopsy of calcifications involving the UPPER OUTER QUADRANT of the  LEFT breast at middle depth. The X shaped biopsy marking clip is appropriately positioned at the site of the biopsied calcifications. All of the calcifications within the group were removed with the biopsy. Expected post biopsy changes are present without evidence of hematoma. IMPRESSION: Appropriate positioning of the X shaped biopsy marking clip at the site of the biopsied calcifications in the UPPER OUTER QUADRANT of the LEFT breast at middle depth. All of the calcifications in the group were removed with the biopsy. Final Assessment: Post Procedure Mammograms for Marker Placement Electronically Signed   By: Evangeline Dakin M.D.   On: 01/14/2021 11:27  MM LT BREAST BX W LOC DEV 1ST LESION IMAGE BX SPEC STEREO GUIDE  Addendum Date: 01/15/2021   ADDENDUM REPORT: 01/15/2021 15:01 ADDENDUM: Pathology revealed DUCTAL CARCINOMA IN SITU, INTERMEDIATE GRADE WITH CALCIFICATIONS of the LEFT breast, upper outer, (X clip). This was found to be concordant by Dr. Peggye Fothergill. Pathology results were discussed with the patient by telephone. The patient reported doing well after the biopsy with tenderness at the site. Post biopsy instructions and care were reviewed and questions were answered. The patient was encouraged to call [facility] for any additional concerns. My direct phone number was provided. The patient was referred to [facility] on January 23, 2021, AM clinic per request, to see Dr. Gunnar Bulla Magrinat. NOTE: There is a tiny group of similar calcifications in the LEFT breast anterior and inferior to the biopsied calcifications that need stereotactic tomosynthesis biopsy if breast-sparing surgery is contemplated. Pathology results reported by Terie Purser, RN on 01/15/2021. Electronically Signed   By: Evangeline Dakin M.D.   On: 01/15/2021 15:01   Result Date: 01/15/2021 CLINICAL DATA:  Screening detected  indeterminate 6 mm group calcifications involving the UPPER OUTER QUADRANT of the LEFT breast at middle depth. Personal history of malignant lumpectomy of the LEFT breast UPPER OUTER QUADRANT at posterior depth in 2006 with adjuvant radiation therapy. EXAM: LEFT BREAST STEREOTACTIC CORE NEEDLE BIOPSY COMPARISON:  Previous exams. FINDINGS: The patient and I discussed the procedure of stereotactic-guided biopsy including benefits and alternatives. We discussed the high likelihood of a successful procedure. We discussed the risks of the procedure including infection, bleeding, tissue injury, clip migration, and inadequate sampling. Informed written consent was given. The usual time out protocol was performed immediately prior to  the procedure. Lesion quadrant: UPPER OUTER QUADRANT. Using sterile technique with chlorhexidine as skin antisepsis, 1% lidocaine and 1% lidocaine with epinephrine as local anesthetic, under stereotactic tomosynthesis guidance, a 9 gauge Brevera vacuum assisted device was used to perform core needle biopsy of calcifications involving the UPPER OUTER QUADRANT of the LEFT breast using a superior approach. Specimen radiograph was performed showing calcifications in all 4 core samples. Specimens with calcifications are identified for pathology. At the conclusion of the procedure, an X shaped tissue marker clip was deployed into the biopsy cavity. Follow-up 2-view mammogram was performed and dictated separately. IMPRESSION: Stereotactic-guided biopsy of indeterminate calcifications involving the UPPER OUTER QUADRANT of the LEFT breast at middle. No apparent complications. Electronically Signed: By: Evangeline Dakin M.D. On: 01/14/2021 11:21      IMPRESSION/PLAN: 1. 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 Dr. Lisbeth Renshaw discusses the pathology  findings and reviews the nature of early stage breast disease. The consensus from the breast conference includes breast conservation with lumpectomy versus mastectomy. She is interested in breast conserving surgery. We discussed the options of considering  re-irradiation. He discusses the course she had previously and if her margins were close with lumpectomy or if she desired the most aggressive approach, knowing the long term risks of more fibrosis, she could external radiotherapy to the breast  to reduce risks of local recurrence followed by antiestrogen therapy. We discussed the risks, benefits, short, and long term effects of radiotherapy, as well as the curative intent, and the patient is interested in seeing pathology to decide. Dr. Lisbeth Renshaw discusses the delivery and logistics of radiotherapy and anticipates a course of 4 or possibly 3 weeks of radiotherapy which would be decided at our next visit. Boost may be eliminated if the lumpectomy cavity was the prior treatment site We will see her back a few weeks after surgery to discuss the simulation process and anticipate we starting radiotherapy about 4-6 weeks after surgery.    In a visit lasting 60 minutes, greater than 50% of the time was spent face to face reviewing her case, as well as in preparation of, discussing, and coordinating the patient's care.  The above documentation reflects my direct findings during this shared patient visit. Please see the separate note by Dr. Lisbeth Renshaw on this date for the remainder of the patient's plan of care.    Carola Rhine, [facility]    **Disclaimer: This note was dictated with voice recognition software. Similar sounding words can inadvertently be transcribed and this note may contain transcription errors which may not have been corrected upon publication of note.**

## 2021-01-23 NOTE — Addendum Note (Signed)
Encounter addended by: Hayden Pedro, PA-C on: 01/23/2021 12:13 PM  Actions taken: Clinical Note Signed

## 2021-01-24 ENCOUNTER — Other Ambulatory Visit: Payer: Self-pay | Admitting: Surgery

## 2021-01-24 ENCOUNTER — Other Ambulatory Visit: Payer: Self-pay | Admitting: *Deleted

## 2021-01-24 DIAGNOSIS — D0512 Intraductal carcinoma in situ of left breast: Secondary | ICD-10-CM

## 2021-01-24 DIAGNOSIS — Z853 Personal history of malignant neoplasm of breast: Secondary | ICD-10-CM

## 2021-01-29 ENCOUNTER — Encounter: Payer: Self-pay | Admitting: *Deleted

## 2021-01-29 NOTE — Progress Notes (Signed)
[facility] CSW Progress Notes  SW Intern called patient to follow up from [facility] on 01/23/21. Patient stated on the call that she is eager to get the surgery over with, but other than that she is doing fine and has no needs at this time.  Rosary Lively, BSW Intern Supervised by Gwinda Maine, LCSW

## 2021-01-31 ENCOUNTER — Telehealth: Payer: Self-pay | Admitting: *Deleted

## 2021-01-31 ENCOUNTER — Encounter: Payer: Self-pay | Admitting: *Deleted

## 2021-01-31 NOTE — Telephone Encounter (Signed)
Spoke with patient to follow up from [facility] 9/21 and assess navigation needs.  Patient denies any questions or concerns at this time Encouraged her to call should anything arise.  Patient verbalized understanding.

## 2021-02-04 ENCOUNTER — Other Ambulatory Visit: Payer: Self-pay

## 2021-02-04 ENCOUNTER — Encounter (HOSPITAL_BASED_OUTPATIENT_CLINIC_OR_DEPARTMENT_OTHER): Payer: Self-pay | Admitting: Surgery

## 2021-02-11 ENCOUNTER — Ambulatory Visit
Admission: RE | Admit: 2021-02-11 | Discharge: 2021-02-11 | Disposition: A | Discharge status: Home / Self Care | Payer: Medicare Other | Source: Ambulatory Visit | Attending: Surgery | Admitting: Surgery

## 2021-02-11 ENCOUNTER — Other Ambulatory Visit: Payer: Self-pay

## 2021-02-11 ENCOUNTER — Other Ambulatory Visit: Payer: Self-pay | Admitting: Surgery

## 2021-02-11 DIAGNOSIS — D0512 Intraductal carcinoma in situ of left breast: Secondary | ICD-10-CM

## 2021-02-11 DIAGNOSIS — R921 Mammographic calcification found on diagnostic imaging of breast: Secondary | ICD-10-CM | POA: Diagnosis not present

## 2021-02-11 MED ORDER — CHLORHEXIDINE GLUCONATE CLOTH 2 % EX PADS: 6.0000 | MEDICATED_PAD | Freq: Once | CUTANEOUS | Status: DC

## 2021-02-11 NOTE — Progress Notes (Signed)
      Enhanced Recovery after Surgery for Orthopedics Enhanced Recovery after Surgery is a protocol used to improve the stress on your body and your recovery after surgery.  Patient Instructions  The night before surgery:  No food after midnight. ONLY clear liquids after midnight  The day of surgery (if you do NOT have diabetes):  Drink ONE (1) Pre-Surgery Clear Ensure as directed.   This drink was given to you during your hospital  pre-op appointment visit. The pre-op nurse will instruct you on the time to drink the  Pre-Surgery Ensure depending on your surgery time. Finish the drink at the designated time by the pre-op nurse.  Nothing else to drink after completing the  Pre-Surgery Clear Ensure.  The day of surgery (if you have diabetes): Drink ONE (1) Gatorade 2 (G2) as directed. This drink was given to you during your hospital  pre-op appointment visit.  The pre-op nurse will instruct you on the time to drink the   Gatorade 2 (G2) depending on your surgery time. Color of the Gatorade may vary. Red is not allowed. Nothing else to drink after completing the  Gatorade 2 (G2).         If you have questions, please contact your surgeon's office.  Surgical soap given to pt and RN explained how to use it, plus written instructions given.  Pt verbalized understanding.

## 2021-02-12 ENCOUNTER — Encounter (HOSPITAL_BASED_OUTPATIENT_CLINIC_OR_DEPARTMENT_OTHER): Payer: Self-pay | Admitting: Surgery

## 2021-02-12 ENCOUNTER — Ambulatory Visit (HOSPITAL_BASED_OUTPATIENT_CLINIC_OR_DEPARTMENT_OTHER)
Admission: RE | Admit: 2021-02-12 | Discharge: 2021-02-12 | Disposition: A | Discharge status: Home / Self Care | Payer: Medicare Other | Attending: Surgery | Admitting: Surgery

## 2021-02-12 ENCOUNTER — Other Ambulatory Visit: Payer: Self-pay

## 2021-02-12 ENCOUNTER — Ambulatory Visit (HOSPITAL_BASED_OUTPATIENT_CLINIC_OR_DEPARTMENT_OTHER): Payer: Medicare Other | Admitting: Anesthesiology

## 2021-02-12 ENCOUNTER — Ambulatory Visit
Admission: RE | Admit: 2021-02-12 | Discharge: 2021-02-12 | Disposition: A | Discharge status: Home / Self Care | Payer: Medicare Other | Source: Ambulatory Visit | Attending: Surgery | Admitting: Surgery

## 2021-02-12 ENCOUNTER — Encounter (HOSPITAL_BASED_OUTPATIENT_CLINIC_OR_DEPARTMENT_OTHER)
Admission: RE | Disposition: A | Discharge status: Home / Self Care | Payer: Self-pay | Source: Home / Self Care | Attending: Surgery

## 2021-02-12 DIAGNOSIS — Z881 Allergy status to other antibiotic agents status: Secondary | ICD-10-CM | POA: Insufficient documentation

## 2021-02-12 DIAGNOSIS — K219 Gastro-esophageal reflux disease without esophagitis: Secondary | ICD-10-CM | POA: Diagnosis not present

## 2021-02-12 DIAGNOSIS — Z803 Family history of malignant neoplasm of breast: Secondary | ICD-10-CM | POA: Diagnosis not present

## 2021-02-12 DIAGNOSIS — D0512 Intraductal carcinoma in situ of left breast: Secondary | ICD-10-CM | POA: Diagnosis not present

## 2021-02-12 DIAGNOSIS — Z888 Allergy status to other drugs, medicaments and biological substances status: Secondary | ICD-10-CM | POA: Diagnosis not present

## 2021-02-12 DIAGNOSIS — Z853 Personal history of malignant neoplasm of breast: Secondary | ICD-10-CM | POA: Insufficient documentation

## 2021-02-12 DIAGNOSIS — Z886 Allergy status to analgesic agent status: Secondary | ICD-10-CM | POA: Insufficient documentation

## 2021-02-12 DIAGNOSIS — Z79899 Other long term (current) drug therapy: Secondary | ICD-10-CM | POA: Diagnosis not present

## 2021-02-12 DIAGNOSIS — G250 Essential tremor: Secondary | ICD-10-CM | POA: Diagnosis not present

## 2021-02-12 DIAGNOSIS — I10 Essential (primary) hypertension: Secondary | ICD-10-CM | POA: Diagnosis not present

## 2021-02-12 DIAGNOSIS — R928 Other abnormal and inconclusive findings on diagnostic imaging of breast: Secondary | ICD-10-CM | POA: Diagnosis not present

## 2021-02-12 HISTORY — PX: BREAST LUMPECTOMY WITH RADIOACTIVE SEED LOCALIZATION: SHX6424

## 2021-02-12 HISTORY — PX: BREAST LUMPECTOMY: SHX2

## 2021-02-12 SURGERY — BREAST LUMPECTOMY WITH RADIOACTIVE SEED LOCALIZATION
Anesthesia: General | Site: Breast | Laterality: Left

## 2021-02-12 MED ORDER — LIDOCAINE HCL (CARDIAC) PF 100 MG/5ML IV SOSY
PREFILLED_SYRINGE | INTRAVENOUS | Status: DC | PRN
  Administered 2021-02-12: 80 mg via INTRAVENOUS

## 2021-02-12 MED ORDER — ACETAMINOPHEN 325 MG PO TABS: 325.0000 mg | ORAL_TABLET | ORAL | Status: DC | PRN

## 2021-02-12 MED ORDER — LACTATED RINGERS IV SOLN: INTRAVENOUS | Status: DC

## 2021-02-12 MED ORDER — VANCOMYCIN HCL 500 MG IV SOLR
INTRAVENOUS | Status: AC
  Filled 2021-02-12: qty 10

## 2021-02-12 MED ORDER — LIDOCAINE 2% (20 MG/ML) 5 ML SYRINGE
INTRAMUSCULAR | Status: AC
  Filled 2021-02-12: qty 5

## 2021-02-12 MED ORDER — OXYCODONE HCL 5 MG/5ML PO SOLN: 5.0000 mg | Freq: Once | ORAL | Status: DC | PRN

## 2021-02-12 MED ORDER — OXYCODONE HCL 5 MG PO TABS: 5.0000 mg | ORAL_TABLET | Freq: Once | ORAL | Status: DC | PRN

## 2021-02-12 MED ORDER — ONDANSETRON HCL 4 MG/2ML IJ SOLN
INTRAMUSCULAR | Status: AC
  Filled 2021-02-12: qty 2

## 2021-02-12 MED ORDER — PHENYLEPHRINE 40 MCG/ML (10ML) SYRINGE FOR IV PUSH (FOR BLOOD PRESSURE SUPPORT)
PREFILLED_SYRINGE | INTRAVENOUS | Status: AC
  Filled 2021-02-12: qty 10

## 2021-02-12 MED ORDER — SODIUM CHLORIDE 0.9 % IV SOLN
INTRAVENOUS | Status: DC | PRN
  Administered 2021-02-12: 250 mL

## 2021-02-12 MED ORDER — BUPIVACAINE-EPINEPHRINE (PF) 0.25% -1:200000 IJ SOLN
INTRAMUSCULAR | Status: DC | PRN
  Administered 2021-02-12: 20 mL

## 2021-02-12 MED ORDER — SODIUM CHLORIDE 0.9 % IV SOLN
INTRAVENOUS | Status: AC
  Filled 2021-02-12: qty 10

## 2021-02-12 MED ORDER — VANCOMYCIN HCL 500 MG IV SOLR
INTRAVENOUS | Status: DC | PRN
  Administered 2021-02-12: 500 mg via TOPICAL

## 2021-02-12 MED ORDER — DEXAMETHASONE SODIUM PHOSPHATE 10 MG/ML IJ SOLN
INTRAMUSCULAR | Status: DC | PRN
  Administered 2021-02-12: 5 mg via INTRAVENOUS

## 2021-02-12 MED ORDER — ONDANSETRON HCL 4 MG/2ML IJ SOLN
INTRAMUSCULAR | Status: DC | PRN
  Administered 2021-02-12: 4 mg via INTRAVENOUS

## 2021-02-12 MED ORDER — ACETAMINOPHEN 160 MG/5ML PO SOLN: 325.0000 mg | ORAL | Status: DC | PRN

## 2021-02-12 MED ORDER — MEPERIDINE HCL 25 MG/ML IJ SOLN: 6.2500 mg | INTRAMUSCULAR | Status: DC | PRN

## 2021-02-12 MED ORDER — CEFAZOLIN SODIUM-DEXTROSE 2-4 GM/100ML-% IV SOLN
INTRAVENOUS | Status: AC
  Filled 2021-02-12: qty 100

## 2021-02-12 MED ORDER — PROPOFOL 10 MG/ML IV BOLUS
INTRAVENOUS | Status: AC
  Filled 2021-02-12: qty 20

## 2021-02-12 MED ORDER — FENTANYL CITRATE (PF) 100 MCG/2ML IJ SOLN: 25.0000 ug | INTRAMUSCULAR | Status: DC | PRN

## 2021-02-12 MED ORDER — ONDANSETRON HCL 4 MG/2ML IJ SOLN: 4.0000 mg | Freq: Once | INTRAMUSCULAR | Status: DC | PRN

## 2021-02-12 MED ORDER — PROPOFOL 10 MG/ML IV BOLUS
INTRAVENOUS | Status: DC | PRN
  Administered 2021-02-12: 150 mg via INTRAVENOUS

## 2021-02-12 MED ORDER — OXYCODONE HCL 5 MG PO TABS: 5.0000 mg | ORAL_TABLET | Freq: Four times a day (QID) | ORAL | 0 refills | Status: DC | PRN

## 2021-02-12 MED ORDER — FENTANYL CITRATE (PF) 100 MCG/2ML IJ SOLN
INTRAMUSCULAR | Status: DC | PRN
  Administered 2021-02-12: 25 ug via INTRAVENOUS

## 2021-02-12 MED ORDER — PHENYLEPHRINE HCL (PRESSORS) 10 MG/ML IV SOLN
INTRAVENOUS | Status: DC | PRN
  Administered 2021-02-12 (×2): 40 ug via INTRAVENOUS
  Administered 2021-02-12 (×2): 80 ug via INTRAVENOUS

## 2021-02-12 MED ORDER — CEFAZOLIN IN SODIUM CHLORIDE 3-0.9 GM/100ML-% IV SOLN
3.0000 g | INTRAVENOUS | Status: AC
Start: 2021-02-12 — End: 2021-02-12
  Administered 2021-02-12: 2 g via INTRAVENOUS

## 2021-02-12 MED ORDER — FENTANYL CITRATE (PF) 100 MCG/2ML IJ SOLN
INTRAMUSCULAR | Status: AC
  Filled 2021-02-12: qty 2

## 2021-02-12 MED ORDER — DEXAMETHASONE SODIUM PHOSPHATE 10 MG/ML IJ SOLN
INTRAMUSCULAR | Status: AC
  Filled 2021-02-12: qty 1

## 2021-02-12 SURGICAL SUPPLY — 51 items
ADH SKN CLS APL DERMABOND .7 (GAUZE/BANDAGES/DRESSINGS) ×1
APL PRP STRL LF DISP 70% ISPRP (MISCELLANEOUS) ×1
APPLIER CLIP 9.375 MED OPEN (MISCELLANEOUS) ×2
APR CLP MED 9.3 20 MLT OPN (MISCELLANEOUS) ×1
BINDER BREAST LRG (GAUZE/BANDAGES/DRESSINGS) IMPLANT
BINDER BREAST MEDIUM (GAUZE/BANDAGES/DRESSINGS) IMPLANT
BINDER BREAST XLRG (GAUZE/BANDAGES/DRESSINGS) ×1 IMPLANT
BINDER BREAST XXLRG (GAUZE/BANDAGES/DRESSINGS) IMPLANT
BLADE SURG 15 STRL LF DISP TIS (BLADE) ×1 IMPLANT
BLADE SURG 15 STRL SS (BLADE) ×2
CANISTER SUC SOCK COL 7IN (MISCELLANEOUS) IMPLANT
CANISTER SUCT 1200ML W/VALVE (MISCELLANEOUS) ×1 IMPLANT
CHLORAPREP W/TINT 26 (MISCELLANEOUS) ×2 IMPLANT
CLIP APPLIE 9.375 MED OPEN (MISCELLANEOUS) IMPLANT
COVER BACK TABLE 60X90IN (DRAPES) ×2 IMPLANT
COVER MAYO STAND STRL (DRAPES) ×2 IMPLANT
COVER PROBE W GEL 5X96 (DRAPES) ×2 IMPLANT
DECANTER SPIKE VIAL GLASS SM (MISCELLANEOUS) IMPLANT
DERMABOND ADVANCED (GAUZE/BANDAGES/DRESSINGS) ×1
DERMABOND ADVANCED .7 DNX12 (GAUZE/BANDAGES/DRESSINGS) ×1 IMPLANT
DRAPE LAPAROTOMY 100X72 PEDS (DRAPES) ×2 IMPLANT
DRAPE UTILITY XL STRL (DRAPES) ×2 IMPLANT
ELECT COATED BLADE 2.86 ST (ELECTRODE) ×2 IMPLANT
ELECT REM PT RETURN 9FT ADLT (ELECTROSURGICAL) ×2
ELECTRODE REM PT RTRN 9FT ADLT (ELECTROSURGICAL) ×1 IMPLANT
GLOVE SRG 8 PF TXTR STRL LF DI (GLOVE) ×1 IMPLANT
GLOVE SURG LTX SZ8 (GLOVE) ×2 IMPLANT
GLOVE SURG POLYISO LF SZ6.5 (GLOVE) ×1 IMPLANT
GLOVE SURG UNDER POLY LF SZ6.5 (GLOVE) ×1 IMPLANT
GLOVE SURG UNDER POLY LF SZ8 (GLOVE) ×2
GOWN STRL REUS W/ TWL LRG LVL3 (GOWN DISPOSABLE) ×2 IMPLANT
GOWN STRL REUS W/ TWL XL LVL3 (GOWN DISPOSABLE) ×1 IMPLANT
GOWN STRL REUS W/TWL LRG LVL3 (GOWN DISPOSABLE) ×2
GOWN STRL REUS W/TWL XL LVL3 (GOWN DISPOSABLE) ×2
HEMOSTAT ARISTA ABSORB 3G PWDR (HEMOSTASIS) IMPLANT
HEMOSTAT SNOW SURGICEL 2X4 (HEMOSTASIS) IMPLANT
KIT MARKER MARGIN INK (KITS) ×2 IMPLANT
NDL HYPO 25X1 1.5 SAFETY (NEEDLE) ×1 IMPLANT
NEEDLE HYPO 25X1 1.5 SAFETY (NEEDLE) ×2 IMPLANT
NS IRRIG 1000ML POUR BTL (IV SOLUTION) ×2 IMPLANT
PACK BASIN DAY SURGERY FS (CUSTOM PROCEDURE TRAY) ×2 IMPLANT
PENCIL SMOKE EVACUATOR (MISCELLANEOUS) ×2 IMPLANT
SLEEVE SCD COMPRESS KNEE MED (STOCKING) ×2 IMPLANT
SPONGE T-LAP 4X18 ~~LOC~~+RFID (SPONGE) ×2 IMPLANT
SUT MNCRL AB 4-0 PS2 18 (SUTURE) ×2 IMPLANT
SUT VICRYL 3-0 CR8 SH (SUTURE) ×2 IMPLANT
SYR CONTROL 10ML LL (SYRINGE) ×2 IMPLANT
TOWEL GREEN STERILE FF (TOWEL DISPOSABLE) ×2 IMPLANT
TRAY FAXITRON CT DISP (TRAY / TRAY PROCEDURE) ×2 IMPLANT
TUBE CONNECTING 20X1/4 (TUBING) ×1 IMPLANT
YANKAUER SUCT BULB TIP NO VENT (SUCTIONS) ×1 IMPLANT

## 2021-02-12 NOTE — Anesthesia Procedure Notes (Signed)
Procedure Name: LMA Insertion Date/Time: 02/12/2021 8:58 AM Performed by: Lavonia Dana, CRNA Pre-anesthesia Checklist: Patient identified, Emergency Drugs available, Suction available and Patient being monitored Patient Re-evaluated:Patient Re-evaluated prior to induction Oxygen Delivery Method: Circle system utilized Preoxygenation: Pre-oxygenation with 100% oxygen Induction Type: IV induction Ventilation: Mask ventilation without difficulty LMA: LMA inserted LMA Size: 4.0 Number of attempts: 1 Airway Equipment and Method: Bite block Placement Confirmation: positive ETCO2 Tube secured with: Tape Dental Injury: Teeth and Oropharynx as per pre-operative assessment

## 2021-02-12 NOTE — Discharge Instructions (Addendum)
[facility] Office Phone Number [phone]  BREAST BIOPSY/ PARTIAL MASTECTOMY: POST OP INSTRUCTIONS  Always review your discharge instruction sheet given to you by the facility where your surgery was performed.  IF YOU HAVE DISABILITY OR FAMILY LEAVE FORMS, YOU MUST BRING THEM TO THE OFFICE FOR PROCESSING.  DO NOT GIVE THEM TO YOUR DOCTOR.  A prescription for pain medication may be given to you upon discharge.  Take your pain medication as prescribed, if needed.  If narcotic pain medicine is not needed, then you may take acetaminophen (Tylenol) or ibuprofen (Advil) as needed. Take your usually prescribed medications unless otherwise directed If you need a refill on your pain medication, please contact your pharmacy.  They will contact our office to request authorization.  Prescriptions will not be filled after 5pm or on week-ends. You should eat very light the first 24 hours after surgery, such as soup, crackers, pudding, etc.  Resume your normal diet the day after surgery. Most patients will experience some swelling and bruising in the breast.  Ice packs and a good support bra will help.  Swelling and bruising can take several days to resolve.  It is common to experience some constipation if taking pain medication after surgery.  Increasing fluid intake and taking a stool softener will usually help or prevent this problem from occurring.  A mild laxative (Milk of Magnesia or Miralax) should be taken according to package directions if there are no bowel movements after 48 hours. Unless discharge instructions indicate otherwise, you may remove your bandages 24-48 hours after surgery, and you may shower at that time.  You may have steri-strips (small skin tapes) in place directly over the incision.  These strips should be left on the skin for 7-10 days.  If your surgeon used skin glue on the incision, you may shower in 24 hours.  The glue will flake off over the next 2-3 weeks.  Any  sutures or staples will be removed at the office during your follow-up visit. ACTIVITIES:  You may resume regular daily activities (gradually increasing) beginning the next day.  Wearing a good support bra or sports bra minimizes pain and swelling.  You may have sexual intercourse when it is comfortable. You may drive when you no longer are taking prescription pain medication, you can comfortably wear a seatbelt, and you can safely maneuver your car and apply brakes. RETURN TO WORK:  ______________________________________________________________________________________ You should see your doctor in the office for a follow-up appointment approximately two weeks after your surgery.  Your doctor's nurse will typically make your follow-up appointment when she calls you with your pathology report.  Expect your pathology report 2-3 business days after your surgery.  You may call to check if you do not hear from us after three days. OTHER INSTRUCTIONS: _______________________________________________________________________________________________ _____________________________________________________________________________________________________________________________________ _____________________________________________________________________________________________________________________________________ _____________________________________________________________________________________________________________________________________  WHEN TO CALL YOUR DOCTOR: Fever over 101.0 Nausea and/or vomiting. Extreme swelling or bruising. Continued bleeding from incision. Increased pain, redness, or drainage from the incision.  The clinic staff is available to answer your questions during regular business hours.  Please don't hesitate to call and ask to speak to one of the nurses for clinical concerns.  If you have a medical emergency, go to the nearest emergency room or call 911.  A surgeon from [facility] is always on call at the hospital.  For further questions, please visit [email or web address]    Post Anesthesia Home Care Instructions  Activity: Get plenty of rest for the remainder of   the day. A responsible individual must stay with you for 24 hours following the procedure.  For the next 24 hours, DO NOT: -Drive a car -Operate machinery -Drink alcoholic beverages -Take any medication unless instructed by your physician -Make any legal decisions or sign important papers.  Meals: Start with liquid foods such as gelatin or soup. Progress to regular foods as tolerated. Avoid greasy, spicy, heavy foods. If nausea and/or vomiting occur, drink only clear liquids until the nausea and/or vomiting subsides. Call your physician if vomiting continues.  Special Instructions/Symptoms: Your throat may feel dry or sore from the anesthesia or the breathing tube placed in your throat during surgery. If this causes discomfort, gargle with warm salt water. The discomfort should disappear within 24 hours.  If you had a scopolamine patch placed behind your ear for the management of post- operative nausea and/or vomiting:  1. The medication in the patch is effective for 72 hours, after which it should be removed.  Wrap patch in a tissue and discard in the trash. Wash hands thoroughly with soap and water. 2. You may remove the patch earlier than 72 hours if you experience unpleasant side effects which may include dry mouth, dizziness or visual disturbances. 3. Avoid touching the patch. Wash your hands with soap and water after contact with the patch.      

## 2021-02-12 NOTE — Transfer of Care (Signed)
Immediate Anesthesia Transfer of Care Note  Patient: Meghan [name]  Procedure(s) Performed: LEFT BREAST LUMPECTOMY WITH RADIOACTIVE SEED LOCALIZATION (Left: Breast)  Patient Location: PACU  Anesthesia Type:General  Level of Consciousness: awake, alert  and oriented  Airway & Oxygen Therapy: Patient Spontanous Breathing and Patient connected to face mask oxygen  Post-op Assessment: Report given to RN and Post -op Vital signs reviewed and stable  Post vital signs: Reviewed and stable  Last Vitals:  Vitals Value Taken Time  BP 143/79 02/12/21 0954  Temp    Pulse 83 02/12/21 0955  Resp 23 02/12/21 0955  SpO2 97 % 02/12/21 0955  Vitals shown include unvalidated device data.  Last Pain:  Vitals:   02/12/21 0732  TempSrc: Oral  PainSc: 0-No pain      Patients Stated Pain Goal: 3 (25/08/71 9941)  Complications: No notable events documented.

## 2021-02-12 NOTE — Anesthesia Preprocedure Evaluation (Addendum)
Anesthesia Evaluation  Patient identified by MRN, date of birth, ID band Patient awake    Reviewed: Allergy & Precautions, NPO status , Patient's Chart, lab work & pertinent test results  Airway Mallampati: II  TM Distance: >3 FB Neck ROM: Full    Dental no notable dental hx. (+) Teeth Intact, Dental Advisory Given, Caps,    Pulmonary neg pulmonary ROS,    Pulmonary exam normal breath sounds clear to auscultation       Cardiovascular hypertension, negative cardio ROS Normal cardiovascular exam Rhythm:Regular Rate:Normal     Neuro/Psych negative neurological ROS  negative psych ROS   GI/Hepatic Neg liver ROS, GERD  Medicated,  Endo/Other  negative endocrine ROS  Renal/GU negative Renal ROS  negative genitourinary   Musculoskeletal negative musculoskeletal ROS (+)   Abdominal   Peds negative pediatric ROS (+)  Hematology negative hematology ROS (+)   Anesthesia Other Findings   Reproductive/Obstetrics negative OB ROS                            Anesthesia Physical  Anesthesia Plan  ASA: 2  Anesthesia Plan: General   Post-op Pain Management:    Induction: Intravenous  PONV Risk Score and Plan: 3 and Ondansetron, Dexamethasone and Treatment may vary due to age or medical condition  Airway Management Planned: LMA and Oral ETT  Additional Equipment: None  Intra-op Plan:   Post-operative Plan: Extubation in OR  Informed Consent: I have reviewed the patients History and Physical, chart, labs and discussed the procedure including the risks, benefits and alternatives for the proposed anesthesia with the patient or authorized representative who has indicated his/her understanding and acceptance.     Dental advisory given  Plan Discussed with: CRNA, Surgeon and Anesthesiologist  Anesthesia Plan Comments:         Anesthesia Quick Evaluation

## 2021-02-12 NOTE — Anesthesia Postprocedure Evaluation (Signed)
Anesthesia Post Note  Patient: Meghan [name]  Procedure(s) Performed: LEFT BREAST LUMPECTOMY WITH RADIOACTIVE SEED LOCALIZATION (Left: Breast)     Patient location during evaluation: PACU Anesthesia Type: General Level of consciousness: awake and alert Pain management: pain level controlled Vital Signs Assessment: post-procedure vital signs reviewed and stable Respiratory status: spontaneous breathing, nonlabored ventilation, respiratory function stable and patient connected to nasal cannula oxygen Cardiovascular status: blood pressure returned to baseline and stable Postop Assessment: no apparent nausea or vomiting Anesthetic complications: no   No notable events documented.  Last Vitals:  Vitals:   02/12/21 1000 02/12/21 1019  BP: (!) 152/73 (!) 154/89  Pulse: 77 93  Resp: 16 16  Temp:  36.5 C  SpO2: 96% 96%    Last Pain:  Vitals:   02/12/21 1019  TempSrc: Oral  PainSc: 0-No pain                 Emerson Barretto

## 2021-02-12 NOTE — Interval H&P Note (Signed)
History and Physical Interval Note:  02/12/2021 8:41 AM  Meghan [name]  has presented today for surgery, with the diagnosis of LEFT BREAST DCIS.  The various methods of treatment have been discussed with the patient and family. After consideration of risks, benefits and other options for treatment, the patient has consented to  Procedure(s): LEFT BREAST LUMPECTOMY WITH RADIOACTIVE SEED LOCALIZATION (Left) as a surgical intervention.  The patient's history has been reviewed, patient examined, no change in status, stable for surgery.  I have reviewed the patient's chart and labs.  Questions were answered to the patient's satisfaction.     [place]

## 2021-02-12 NOTE — H&P (Signed)
History of Present Illness: Meghan [name] is a 75 y.o. female who is seen today as an office consultation at the request of Dr. Jana Hakim for evaluation of Breast Cancer .   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 She has a past history of left breast cancer treated in 2006 with breast conserving surgery, sentinel lymph node mapping and MammoSite radiation she states. She was maintained on tamoxifen for 5 years. She is developed a mammographic abnormality left breast upper outer quadrant of calcifications measuring about 2 cm appears maximal diameter. Core biopsy showed DCIS. She presents today to the multidisciplinary clinic for evaluation. She has no history of breast pain nipple discharge or other abnormality with either breast.  Review of Systems: A complete review of systems was obtained from the patient. I have reviewed this information and discussed as appropriate with the patient. See HPI as well for other ROS.  Review of Systems  Constitutional: Negative.  HENT: Negative.  Eyes: Negative.  Respiratory: Negative.  Cardiovascular: Negative.  Gastrointestinal: Negative.  Genitourinary: Negative.  Musculoskeletal: Negative.  Skin: Negative.  Neurological: Negative.  Endo/Heme/Allergies: Negative.  Psychiatric/Behavioral: Negative.    Medical History: Past Medical History:  Diagnosis Date   Arthritis   GERD (gastroesophageal reflux disease)   History of cancer  2006   Patient Active Problem List  Diagnosis   Ductal carcinoma in situ (DCIS) of left breast   History of cancer   Past Surgical History:  Procedure Laterality Date   Breast lumpectomy Left  2006   CATARACT EXTRACTION Bilateral  Date Unknown   CESAREAN SECTION  Date Unknown   COLONOSCOPY N/A  Date Unknown   ORIF ANKLE FRACTURE Left  08/24/2015   Polypectomy N/A  Date Unknown    Allergies  Allergen Reactions   Amlodipine Swelling   Flagyl [Metronidazole]  Unknown   Lisinopril Swelling   Nsaids (Non-Steroidal Anti-Inflammatory Drug) Other (See Comments)  CKD per PCP   Tetracycline Unknown   Current Outpatient Medications on File Prior to Visit  Medication Sig Dispense Refill   cholecalciferol (VITAMIN D3) 2,000 unit capsule Take 2,000 Units by mouth once daily   clidinium-chlordiazepoxide (LIBRAX) 5-2.5 mg capsule Take 1 capsule by mouth 4 (four) times daily before meals and nightly   gabapentin (NEURONTIN) 300 MG capsule Take 300 mg by mouth 2 (two) times daily before breakfast and nightly   pantoprazole (PROTONIX) 40 MG DR tablet Take 40 mg by mouth once daily   pramipexole (MIRAPEX) 0.25 MG tablet Take 0.25 mg by mouth 3 (three) times daily   rosuvastatin (CRESTOR) 10 MG tablet Take 10 mg by mouth once daily   No current facility-administered medications on file prior to visit.   Family History  Problem Relation Age of Onset   Breast cancer Sister   Breast cancer Maternal Aunt   Breast cancer Paternal Aunt   Cancer Cousin    Social History   Tobacco Use  Smoking Status Never Smoker  Smokeless Tobacco Never Used    Social History   Socioeconomic History   Marital status: Married  Tobacco Use   Smoking status: Never Smoker   Smokeless tobacco: Never Used  Scientific laboratory technician Use: Never used  Substance and Sexual Activity   Alcohol use: Yes  Comment: "occasionally"   Drug use: Never   Objective:  There were no vitals filed for this visit.  There is no height or weight on file to calculate BMI.  Physical Exam Constitutional:  Appearance: Normal appearance.  HENT:  Head: Normocephalic.  Eyes:  Pupils: Pupils are equal, round, and reactive to light.  Cardiovascular:  Rate and Rhythm: Normal rate.  Pulmonary:  Effort: Pulmonary effort is normal.  Breath sounds: No stridor.  Chest:  Breasts:  Right: Normal. No swelling, inverted nipple, mass or nipple discharge.  Left: Normal. No swelling, inverted nipple,  mass or nipple discharge.   Musculoskeletal:  General: Normal range of motion.  Cervical back: Normal range of motion and neck supple.  Lymphadenopathy:  Upper Body:  Right upper body: No supraclavicular or axillary adenopathy.  Left upper body: No supraclavicular or axillary adenopathy.  Skin: General: Skin is warm.  Neurological:  General: No focal deficit present.  Mental Status: She is alert.  Psychiatric:  Mood and Affect: Mood normal.  Behavior: Behavior normal.     Labs, Imaging and Diagnostic Testing: 75 year old female presenting as a recall from screening for left breast calcifications.   EXAM: DIGITAL DIAGNOSTIC UNILATERAL LEFT MAMMOGRAM WITH CAD   TECHNIQUE: Left digital diagnostic mammography was performed. Mammographic images were processed with CAD.   COMPARISON: Previous exam(s).   ACR Breast Density Category b: There are scattered areas of fibroglandular density.   FINDINGS: Spot 2D magnification views and full field mL 2D views of the left breast were performed. There is persistence of linear calcifications spanning 5 mm in the upper outer left breast. No associated mass or distortion.   IMPRESSION: Indeterminate calcifications in the upper outer left breast spanning 5 mm.   RECOMMENDATION: Stereotactic core needle biopsy of the left breast calcifications.   I have discussed the findings and recommendations with the patient who agrees to proceed with biopsy. The patient be scheduled for the biopsy appointment prior to leaving the office today.   BI-RADS CATEGORY 4: Suspicious.     Electronically Signed By: Audie Pinto M.D. On: 01/11/2021 16:25 ADDITIONAL INFORMATION: PROGNOSTIC INDICATORS Results: IMMUNOHISTOCHEMICAL AND MORPHOMETRIC ANALYSIS PERFORMED MANUALLY Estrogen Receptor: 100%, POSITIVE, STRONG STAINING INTENSITY Progesterone Receptor: 30%, POSITIVE, STRONG STAINING INTENSITY REFERENCE RANGE ESTROGEN RECEPTOR NEGATIVE  0% POSITIVE =>1% REFERENCE RANGE PROGESTERONE RECEPTOR NEGATIVE 0% POSITIVE =>1% All controls stained appropriately Thressa Sheller MD Pathologist, Electronic Signature ( Signed 01/16/2021) FINAL DIAGNOSIS Diagnosis Breast, left, needle core biopsy, upper outer, X clip - DUCTAL CARCINOMA IN SITU, INTERMEDIATE GRADE WITH CALCIFICATIONS. SEE NOTE Diagnosis Note DCIS measures 0.4 cm in greatest linear dimension. Dr. Melina Copa reviewed the case and concurs with the diagnosis. A breast prognostic profile (ER, PR) is pending and will be reported in an addendum. [facility] was notified on 01/15/2021. 1 of 2 FINAL for CHARM, STENNER ([id number]) Jaquita Folds MD Pathologist, Electronic Signature (Case signed 01/15/2021) Assessment and Plan:  Diagnoses and all orders for this visit:  Ductal carcinoma in situ (DCIS) of left breast    Patient with history of left breast cancer 2006 treated with left breast lumpectomy, MammoSite and left axillary sentinel lymph node mapping for stage I hormone receptor positive disease.  She had 5 years of tamoxifen  Discussed options of treatment for this. Given her history of MammoSite, she is a candidate for repeat lumpectomy with the possibility of whole breast radiation therapy this time. The other option would be lumpectomy with tamoxifen alone. Medical and radiation oncology to see. I think she is a good lumpectomy candidate and has how she would like to proceed. Discussed mastectomy as well as an alternative. She is very content on keeping her  breast therefore we will plan on left breast lumpectomy alone. Risk bleeding, infection, recurrence rates, complications, cosmesis, pain, and long-term expectations and outcomes discussed. Discussed the pros and cons of mastectomy in the circumstance with lumpectomy. She agrees to proceed. Risk of bleeding, infection, cosmesis, pain, the need for further surgery depending on final pathology  as well as reexcision discussed.  No follow-ups on file.  Kennieth Francois, MD   I spent a total of 46 minutes in both face-to-face and non-face-to-face activities for this visit on the date of this encounter.

## 2021-02-12 NOTE — Op Note (Signed)
eoperative diagnosis: Left breast DCIS upper outer quadrant   Postoperative diagnosis: Same   Procedure: Left breast seed localized lumpectomy  Surgeon: Erroll Luna M.D.  Anesthesia: Gen. With 0.25% Sensorcaine local  EBL: 20 cc  Specimen: Left breast tissue with clip and radioactive seed in the specimen. Verified with neoprobe and radiographic image showing both seed and clip in specimen.  There was a second seed roved by itself and imaged.  All margins were excised.    Indications for procedure: The patient presents for left breast lumpectomy after core biopsy showed DCIS.There was a second area adjacent to this that excision was recommended.  A second seed was used for this. . Pt seed in the [facility] and chose breast conserving surgery after reviewing all of her options.   The procedure has been discussed with the patient. Alternatives to surgery have been discussed with the patient.  Risks of surgery include bleeding,  Infection,  cosmesis, seed use,   Seroma formation, death,  and the need for further surgery.   The patient understands and wishes to proceed.   Description of procedure: Patient underwent seed placement as an outpatient. Patient presents today for left breast seed localized lumpectomy. Patient and holding area. Questions are answered and neoprobe used to verify seed location. Patient taken back to the operating room and placed upon the OR table. After induction of general anesthesia, left breast prepped and draped in a sterile fashion. Timeout was done to verify proper site/  procedure. Neoprobe used and hot spot identified and left breast upper-outer quadrant. This was marked with pen. Curvilinear incision made left upper outer quadrant breast. Dissection used with the help of a neoprobe around the tissue where the seed and clip were located. Tissue removed in its entirety with gross  negative margins.  The second seed was identified and removed separately.  I elected to excise  all margins.   Neoprobe used and seed/clip  within specimen. Radiographs taken which show clip and seed  In specimen.   All margins were excised and sent separately. Irrigation used and clips applied.  Vancomycin powder placed.   Hemostasis achieved and cavity closed with 3-0 Vicryl and 4-0 Monocryl. Dermabond applied. All final counts found to be correct. Specimen transported to pathology. Patient awoke extubated taken to recovery in satisfactory condition.

## 2021-02-14 ENCOUNTER — Encounter: Payer: Self-pay | Admitting: Surgery

## 2021-02-14 LAB — SURGICAL PATHOLOGY

## 2021-02-15 ENCOUNTER — Encounter (HOSPITAL_BASED_OUTPATIENT_CLINIC_OR_DEPARTMENT_OTHER): Payer: Self-pay | Admitting: Surgery

## 2021-02-19 ENCOUNTER — Encounter: Payer: Self-pay | Admitting: *Deleted

## 2021-02-26 NOTE — Telephone Encounter (Signed)
Do you advise for her to stop this medication?

## 2021-03-17 NOTE — Progress Notes (Addendum)
Cardiology Office Note:    Date:  03/18/2021   ID:  Meghan [name], DOB [birth year], MRN [id number]  PCP:  Marda Stalker, PA-C  Cardiologist:  None  Electrophysiologist:  None   Referring MD: Hulan Fess, MD   Chief Complaint  Patient presents with   Follow-up    12 months.     History of Present Illness:    Meghan [name] is a 75 y.o. female with a hx of breast cancer, esophageal stricture who presents for follow-up. She was referred by Dr. Rex Kras for evaluation of chest pain, initially seen on 10/13/2019. She reports that she has been having intermittent chest pain.  Reports that it occurs every day, can last up to 30 minutes.  Can occur with exertion, but she states she has not been exercising due to back pain.  States that over the last 2 weeks she has tried to walk on the treadmill 1-2 times, but does walk for less than 10 minutes.  Felt short of breath when she tried to do this.  States that she feels her shortness of breath has worsened over the last 6 weeks.  Walking up hills causes her to be short of breath.  Reports BP has been elevated up to 150s at home.  No smoking history.  Family history includes father had MI in 86s.  Lexiscan Myoview on 10/21/2019 showed normal perfusion, EF 68%. Echocardiogram on 11/11/2019 showed LVEF 65 to 70%, moderate LVH, grade 1 diastolic dysfunction, normal RV function, mild AI. Calcium score on 11/11/2019 was 57 (54th percentile); also notable for small pulmonary nodules.  Since last clinic visit, she was recently diagnosed with breast cancer.  Underwent lumpectomy.  Considering radiation.  Reports BP has been 120s over 70s at home.  Denies any chest pain, dyspnea, lightheadedness, syncope, or palpitations.  Does report some swelling in her left ankle, has not been exercising.    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%   Cataract     REMOVED 05-2020 AND 06-2020 BILAT   Colon polyps    Hyperplastic    Diverticulosis of colon (without mention of hemorrhage)    Esophageal stricture    GERD (gastroesophageal reflux disease)    History of radiation therapy    History of urinary tract infection    Hypertension    CONTROLLED- OFF MEDS -   Lower back pain    Neuromuscular disorder (HCC)    peripheral nueropathy   Numbness    feet bilat at nighttime   Osteopenia    Personal history of radiation therapy 2006   Restless leg syndrome 03/27/2014   Restless legs syndrome (RLS)    Toe pain, right 09/2014   for 2 wks   Tremor, 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   Head and neck tremor   Urinary urgency     Past Surgical History:  Procedure Laterality Date   BREAST LUMPECTOMY Left 2006   BREAST LUMPECTOMY WITH RADIOACTIVE SEED LOCALIZATION Left 02/12/2021   Procedure: LEFT BREAST LUMPECTOMY WITH RADIOACTIVE SEED LOCALIZATION;  Surgeon: Erroll Luna, MD;  Location: [facility];  Service: General;  Laterality: Left;   BREAST SURGERY     left 2006   CESAREAN SECTION     COLONOSCOPY     EYE SURGERY  2022   FRACTURE SURGERY  2017   ORIF ANKLE FRACTURE Left 08/24/2015   Procedure: OPEN  REDUCTION INTERNAL FIXATION (ORIF) LEFT ANKLE FRACTURE;  Surgeon: Mcarthur Rossetti, MD;  Location: [facility];  Service: Orthopedics;  Laterality: Left;  General with a popliteal block   POLYPECTOMY     HPP 2011    Current Medications: Current Meds  Medication Sig   Cholecalciferol (VITAMIN D3) 2000 units TABS Take 2,000 Units by mouth daily.    clidinium-chlordiazePOXIDE (LIBRAX) 5-2.5 MG capsule Take 1 capsule by mouth as needed.   Coenzyme Q10 (CO Q-10) 100 MG CAPS Take 200 mg by mouth daily.   Cranberry-Vitamin C-Probiotic (AZO CRANBERRY) 250-30 MG TABS Take 1 tablet by mouth 2 (two) times daily.    fluticasone (FLONASE) 50 MCG/ACT nasal spray Place 1 spray into both nostrils daily as needed for allergies or rhinitis.     gabapentin (NEURONTIN) 300 MG capsule Take 2 capsules (600 mg total) by mouth at bedtime.   Glucosamine HCl (GLUCOSAMINE PO) Take by mouth 2 (two) times daily. 1500/1200 mg   Multiple Vitamin (MULTIVITAMIN) tablet Take 1 tablet by mouth every evening.    Omega-3 Fatty Acids (FISH OIL) 1200 MG CAPS Take 1,200 mg by mouth daily.    pantoprazole (PROTONIX) 40 MG tablet Take 1 tablet (40 mg total) by mouth daily.   rosuvastatin (CRESTOR) 10 MG tablet Take 1 tablet (10 mg total) by mouth daily.   TURMERIC PO Take 1 capsule by mouth 2 (two) times a day. 1000 mg     Allergies:   Lisinopril, Amlodipine, Flagyl [metronidazole], Nsaids, and Tetracycline hcl   Social History   Socioeconomic History   Marital status: Married    Spouse name: Not on file   Number of children: 2   Years of education: AS   Highest education level: Not on file  Occupational History   Occupation: Programmer, multimedia: [employer]   Occupation: Retired  Tobacco Use   Smoking status: Never   Smokeless tobacco: Never  Scientific laboratory technician Use: Never used  Substance and Sexual Activity   Alcohol use: Yes    Comment: rare occ drink wine   Drug use: No   Sexual activity: Yes  Other Topics Concern   Not on file  Social History Narrative   Lives at home w/ her husband   Right-handed   Occasionally drinks tea   Social Determinants of Health   Financial Resource Strain: Low Risk    Difficulty of Paying Living Expenses: Not very hard  Food Insecurity: No Food Insecurity   Worried About Charity fundraiser in the Last Year: Never true   [place] in the Last Year: Never true  Transportation Needs: No Transportation Needs   Lack of Transportation (Medical): No   Lack of Transportation (Non-Medical): No  Physical Activity: Not on file  Stress: Not on file  Social Connections: Not on file     Family History:  family history includes Alzheimer's disease in her maternal aunt; Breast cancer in her  maternal aunt and maternal aunt; Breast cancer (age of onset: 44) in her sister; Breast cancer (age of onset: 23) in her cousin; COPD in her father; Cancer in her maternal aunt, maternal aunt, and sister; Congestive Heart Failure in her maternal grandmother; Emphysema in her father; Esophageal cancer in her cousin; Heart attack in her mother; Heart disease in her father, maternal grandmother, and paternal grandmother; Obesity in her brother; Ovarian cancer in her cousin; Squamous cell carcinoma in her maternal aunt; Varicose Veins in her mother. There  is no history of Neuropathy, Thyroid disease, Colon cancer, Colon polyps, Rectal cancer, or Stomach cancer.  ROS:   Please see the history of present illness.    All other systems reviewed and are negative.  EKGs/Labs/Other Studies Reviewed:    The following studies were reviewed today:   EKG:   03/18/21: Normal sinus rhythm, rate 78, low voltage, poor R wave progression  Recent Labs: 01/23/2021: ALT 19; BUN 16; Creatinine 0.96; Hemoglobin 13.6; Platelet Count 206; Potassium 3.9; Sodium 141  Recent Lipid Panel    Component Value Date/Time   CHOL 119 01/13/2020 0812   TRIG 117 01/13/2020 0812   HDL 49 01/13/2020 0812   CHOLHDL 2.4 01/13/2020 0812   LDLCALC 49 01/13/2020 0812    Physical Exam:    VS:  BP 134/86 (BP Location: Left Arm, Patient Position: Sitting, Cuff Size: Normal)   Pulse 78   Ht _0  (1.6 m)   Wt 193 lb (87.5 kg)   BMI 34.19 kg/m     Wt Readings from Last 3 Encounters:  03/18/21 193 lb (87.5 kg)  02/12/21 194 lb 14.2 oz (88.4 kg)  01/23/21 193 lb 12.8 oz (87.9 kg)     GEN: in no acute distress HEENT: Normal NECK: No JVD; No carotid bruits LYMPHATICS: No lymphadenopathy CARDIAC: RRR, no murmurs, rubs, gallops RESPIRATORY:  Clear to auscultation without rales, wheezing or rhonchi  ABDOMEN: Soft, non-tender, non-distended MUSCULOSKELETAL:  No edema; No deformity  SKIN: Warm and dry NEUROLOGIC:  Alert and  oriented x 3 PSYCHIATRIC:  Normal affect   ASSESSMENT:    1. Chest pain of uncertain etiology   2. DOE (dyspnea on exertion)   3. Essential hypertension   4. Hyperlipidemia, unspecified hyperlipidemia type   5. Pulmonary nodules     PLAN:    Chest pain/dyspnea on exertion: Chest pain is atypical in description.  Lexiscan Myoview on 10/21/2019 showed normal perfusion, EF 68%. Echocardiogram on 11/11/2019 showed LVEF 65 to 70%, moderate LVH, grade 1 diastolic dysfunction, normal RV function, mild AI. Calcium score on 11/11/2019 was 57 (54th percentile).  Symptoms improving, no further cardiac work-up recommended. -Has not been exercising, will refer to PREP program at [facility]  Hypertension: Previously on lisinopril and amlodipine, but did not tolerate.  Currently off antihypertensives, and BP appears reasonably controlled.  Continue to monitor  Hyperlipidemia: LDL 48 on 11/30/2020 continue rosuvastatin 10 mg daily.  We will recheck lipid panel  Pulmonary nodules: CT calcium score 11/2019 showed small pulmonary nodules. Likely benign but given her breast cancer history, will plan repeat CT chest in 1 year  RTC in 1 year   Medication Adjustments/Labs and Tests Ordered: Current medicines are reviewed at length with the patient today.  Concerns regarding medicines are outlined above.  No orders of the defined types were placed in this encounter.  No orders of the defined types were placed in this encounter.   Patient Instructions  Medication Instructions:  Your physician recommends that you continue on your current medications as directed. Please refer to the Current Medication list given to you today.  *If you need a refill on your cardiac medications before your next appointment, please call your pharmacy*  Testing/Procedures: CT chest without contrast at [place]: At [facility], you and your health needs are our priority.  As part of our continuing mission to  provide you with exceptional heart care, we have created designated Provider Care Teams.  These Care Teams include your primary Cardiologist (physician)  and Advanced Practice Providers (APPs -  Physician Assistants and Nurse Practitioners) who all work together to provide you with the care you need, when you need it.  We recommend signing up for the patient portal called "MyChart".  Sign up information is provided on this After Visit Summary.  MyChart is used to connect with patients for Virtual Visits (Telemedicine).  Patients are able to view lab/test results, encounter notes, upcoming appointments, etc.  Non-urgent messages can be sent to your provider as well.   To learn more about what you can do with MyChart, go to [email or web address].    Your next appointment:   12 month(s)  The format for your next appointment:   In Person  Provider:   Dr. Gardiner Rhyme  Other Instructions You have been referred to: [facility] PREP program    Signed, Donato Heinz, MD  03/18/2021 9:38 AM    [place]

## 2021-03-18 ENCOUNTER — Other Ambulatory Visit: Payer: Self-pay

## 2021-03-18 ENCOUNTER — Ambulatory Visit (INDEPENDENT_AMBULATORY_CARE_PROVIDER_SITE_OTHER): Payer: Medicare Other | Admitting: Cardiology

## 2021-03-18 ENCOUNTER — Encounter: Payer: Self-pay | Admitting: Cardiology

## 2021-03-18 VITALS — BP 134/86 | HR 78 | Ht 63.0 in | Wt 193.0 lb

## 2021-03-18 DIAGNOSIS — R0609 Other forms of dyspnea: Secondary | ICD-10-CM

## 2021-03-18 DIAGNOSIS — R079 Chest pain, unspecified: Secondary | ICD-10-CM

## 2021-03-18 DIAGNOSIS — R918 Other nonspecific abnormal finding of lung field: Secondary | ICD-10-CM | POA: Diagnosis not present

## 2021-03-18 DIAGNOSIS — E785 Hyperlipidemia, unspecified: Secondary | ICD-10-CM | POA: Diagnosis not present

## 2021-03-18 DIAGNOSIS — I10 Essential (primary) hypertension: Secondary | ICD-10-CM

## 2021-03-18 NOTE — Patient Instructions (Signed)
Medication Instructions:  Your physician recommends that you continue on your current medications as directed. Please refer to the Current Medication list given to you today.  *If you need a refill on your cardiac medications before your next appointment, please call your pharmacy*  Testing/Procedures: CT chest without contrast at [place]: At [facility], you and your health needs are our priority.  As part of our continuing mission to provide you with exceptional heart care, we have created designated Provider Care Teams.  These Care Teams include your primary Cardiologist (physician) and Advanced Practice Providers (APPs -  Physician Assistants and Nurse Practitioners) who all work together to provide you with the care you need, when you need it.  We recommend signing up for the patient portal called "MyChart".  Sign up information is provided on this After Visit Summary.  MyChart is used to connect with patients for Virtual Visits (Telemedicine).  Patients are able to view lab/test results, encounter notes, upcoming appointments, etc.  Non-urgent messages can be sent to your provider as well.   To learn more about what you can do with MyChart, go to [email or web address].    Your next appointment:   12 month(s)  The format for your next appointment:   In Person  Provider:   Dr. Gardiner Rhyme  Other Instructions You have been referred to: [facility] PREP program

## 2021-03-19 ENCOUNTER — Ambulatory Visit: Payer: Medicare Other | Attending: Radiation Oncology | Admitting: Radiation Oncology

## 2021-03-19 ENCOUNTER — Encounter: Payer: Self-pay | Admitting: Radiation Oncology

## 2021-03-19 ENCOUNTER — Ambulatory Visit
Admission: RE | Admit: 2021-03-19 | Discharge: 2021-03-19 | Disposition: A | Discharge status: Home / Self Care | Payer: Medicare Other | Source: Ambulatory Visit | Attending: Radiation Oncology | Admitting: Radiation Oncology

## 2021-03-19 VITALS — BP 157/79 | HR 80 | Temp 97.8°F | Resp 20 | Ht 64.0 in | Wt 194.4 lb

## 2021-03-19 DIAGNOSIS — C50412 Malignant neoplasm of upper-outer quadrant of left female breast: Secondary | ICD-10-CM

## 2021-03-19 DIAGNOSIS — Z170 Estrogen receptor positive status [ER+]: Secondary | ICD-10-CM | POA: Diagnosis not present

## 2021-03-19 DIAGNOSIS — D0512 Intraductal carcinoma in situ of left breast: Secondary | ICD-10-CM | POA: Diagnosis not present

## 2021-03-19 NOTE — Progress Notes (Signed)
Patient reports occasional, sharp LT sided breast pain 1/10. No other symptoms reported at this time.  Meaningful use complete.  Postmenopausal- No chances of pregnancy.  BP (!) 157/79 (BP Location: Right Arm, Patient Position: Sitting, Cuff Size: Normal)   Pulse 80   Temp 97.8 F (36.6 C)   Resp 20   Ht 5\' 4"  (1.626 m)   Wt 194 lb 6.4 oz (88.2 kg)   SpO2 99%   BMI 33.37 kg/m

## 2021-03-19 NOTE — Progress Notes (Signed)
Radiation Oncology         [phone] ________________________________  Name: Meghan [name]        MRN: [id number]  Date of Service: 03/19/2021 DOB: 06-21-1945  CC:Marda Stalker, PA-C  Magrinat, Virgie Dad, MD     REFERRING PHYSICIAN: Magrinat, Virgie Dad, MD   DIAGNOSIS: The encounter diagnosis was Malignant neoplasm of upper-outer quadrant of left female breast, unspecified estrogen receptor status ([place]).   HISTORY OF PRESENT ILLNESS: Meghan [name] is a 75 y.o. female originally seen in the multidisciplinary breast clinic for a new diagnosis of left breast cancer. 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 Recently, the patient was noted to have screening detected calcifications in the left breast and diagnostic mammogram showed a 5 mm group of indeterminate calcifications in the upper outer quadrant of the left breast. 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   Since her last visit, the patient has undergone left lumpectomy on 02/12/2021, final pathology showed a focus of intermediate to high-grade DCIS that was negative for invasive carcinoma.  Additional margins superiorly, medially, inferiorly and laterally, and posteriorly were all negative for disease.  The focus of her DCIS in total measured 2 mm.  She is seen today to discuss adjuvant radiotherapy.     PREVIOUS RADIATION THERAPY: Yes   10/30/04-11/06/04:  The patient's left breast was treated with high dose rad iridium-192 brachytherapy via the Mammosite Balloon Catheter. Treatment was given twice a day administering 340 cGy per fraction prescribed to a distance of 9 mm from the balloon surface for a total dose of 34 Gy in 10 bid fractions with Dr. Sondra Come.   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%   Cataract    REMOVED 05-2020 AND 06-2020 BILAT   Colon polyps    Hyperplastic    Diverticulosis of colon (without mention of hemorrhage)    Esophageal stricture    GERD (gastroesophageal reflux disease)    History of radiation therapy    History of urinary tract infection    Hypertension    CONTROLLED- OFF MEDS -   Lower back pain    Neuromuscular disorder (HCC)    peripheral nueropathy   Numbness    feet bilat at nighttime   Osteopenia    Personal history of radiation therapy 2006   Restless leg syndrome 03/27/2014   Restless legs syndrome (RLS)    Toe pain, right 09/2014   for 2 wks   Tremor, 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   Head and neck tremor   Urinary urgency        PAST SURGICAL HISTORY: Past Surgical History:  Procedure Laterality Date   BREAST LUMPECTOMY Left 2006   BREAST LUMPECTOMY WITH RADIOACTIVE SEED LOCALIZATION Left 02/12/2021   Procedure: LEFT BREAST LUMPECTOMY WITH RADIOACTIVE SEED LOCALIZATION;  Surgeon: Erroll Luna, MD;  Location: [facility];  Service: General;  Laterality: Left;   BREAST SURGERY     left 2006   CESAREAN SECTION     COLONOSCOPY     EYE SURGERY  2022   FRACTURE SURGERY  2017   ORIF ANKLE FRACTURE Left 08/24/2015   Procedure: OPEN REDUCTION INTERNAL FIXATION (ORIF)  LEFT ANKLE FRACTURE;  Surgeon: Mcarthur Rossetti, MD;  Location: [facility];  Service: Orthopedics;  Laterality: Left;  General with a popliteal block   POLYPECTOMY     HPP 2011     FAMILY HISTORY:  Family History  Problem Relation Age of Onset   Heart attack Mother    Varicose Veins Mother    COPD Father    Heart disease Father    Emphysema Father        smoker   Breast cancer Sister 29       mastectomy; metastasis to liver and lung   Cancer Sister    Obesity Brother    Breast cancer Maternal Aunt        dx. late 34s   Cancer  Maternal Aunt    Alzheimer's disease Maternal Aunt    Breast cancer Maternal Aunt        dx. 70s   Squamous cell carcinoma Maternal Aunt        recently removed; dx. 95   Cancer Maternal Aunt    Heart disease Maternal Grandmother    Congestive Heart Failure Maternal Grandmother    Heart disease Paternal Grandmother    Breast cancer Cousin 62       genetic testing in approx 2015   Ovarian cancer Cousin        dx. 43s   Esophageal cancer Cousin        not a smoker   Neuropathy Neg Hx    Thyroid disease Neg Hx    Colon cancer Neg Hx    Colon polyps Neg Hx    Rectal cancer Neg Hx    Stomach cancer Neg Hx      SOCIAL HISTORY:  reports that she has never smoked. She has never used smokeless tobacco. She reports current alcohol use. She reports that she does not use drugs. The patient is married and lives in [place]. 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    ALLERGIES: Lisinopril, Amlodipine, Flagyl [metronidazole], Nsaids, and Tetracycline hcl   MEDICATIONS:  Current Outpatient Medications  Medication Sig Dispense Refill   Cholecalciferol (VITAMIN D3) 2000 units TABS Take 2,000 Units by mouth daily.      clidinium-chlordiazePOXIDE (LIBRAX) 5-2.5 MG capsule Take 1 capsule by mouth as needed. 30 capsule 3   Coenzyme Q10 (CO Q-10) 100 MG CAPS Take 200 mg by mouth daily.     Cranberry-Vitamin C-Probiotic (AZO CRANBERRY) 250-30 MG TABS Take 1 tablet by mouth 2 (two) times daily.      fluticasone (FLONASE) 50 MCG/ACT nasal spray Place 1 spray into both nostrils daily as needed for allergies or rhinitis.      gabapentin (NEURONTIN) 300 MG capsule Take 2 capsules (600 mg total) by mouth at bedtime. 180 capsule 4   Glucosamine HCl (GLUCOSAMINE PO) Take by mouth 2 (two) times daily. 1500/1200 mg     Multiple Vitamin (MULTIVITAMIN) tablet Take 1 tablet by mouth every evening.      Omega-3 Fatty Acids (FISH OIL) 1200 MG CAPS Take 1,200 mg by mouth  daily.      pantoprazole (PROTONIX) 40 MG tablet Take 1 tablet (40 mg total) by mouth daily. 90 tablet 3   rosuvastatin (CRESTOR) 10 MG tablet Take 1 tablet (10 mg total) by mouth daily. 90 tablet 1   TURMERIC PO Take 1 capsule by mouth 2 (two) times a day. 1000 mg     No current facility-administered medications for  this encounter.     REVIEW OF SYSTEMS: On review of systems, the patient reports that she is doing well overall. She has had some fullness in her breast as well as some mild sharp pain in her breast since surgery. No other complaints are verbalized.      PHYSICAL EXAM:  Wt Readings from Last 3 Encounters:  03/18/21 193 lb (87.5 kg)  02/12/21 194 lb 14.2 oz (88.4 kg)  01/23/21 193 lb 12.8 oz (87.9 kg)   Temp Readings from Last 3 Encounters:  02/12/21 97.7 F (36.5 C) (Oral)  01/23/21 97.8 F (36.6 C) (Temporal)  11/14/20 (!) 97.5 F (36.4 C)   BP Readings from Last 3 Encounters:  03/18/21 134/86  02/12/21 (!) 154/89  01/23/21 (!) 158/68   Pulse Readings from Last 3 Encounters:  03/18/21 78  02/12/21 93  01/23/21 76    In general this is a well appearing caucasian female in no acute distress. She's alert and oriented x4 and appropriate throughout the examination. Cardiopulmonary assessment is negative for acute distress and she exhibits normal effort. Her left breast reveals a well healed surgical incision site without erythema, separation or drainage. Above the incision however appears to be a seroma that is fluctuant and about 3-4 cm in greatest dimension.    ECOG = 1  0 - Asymptomatic (Fully active, able to carry on all predisease activities without restriction)  1 - Symptomatic but completely ambulatory (Restricted in physically strenuous activity but ambulatory and able to carry out work of a light or sedentary nature. For example, light housework, office work)  2 - Symptomatic, <50% in bed during the day (Ambulatory and capable of all self care but  unable to carry out any work activities. Up and about more than 50% of waking hours)  3 - Symptomatic, >50% in bed, but not bedbound (Capable of only limited self-care, confined to bed or chair 50% or more of waking hours)  4 - Bedbound (Completely disabled. Cannot carry on any self-care. Totally confined to bed or chair)  5 - Death   Eustace Pen MM, Creech RH, Tormey DC, et al. [phone]). "Toxicity and response criteria of the [facility]". [place] Oncol. 5 (6): 649-55    LABORATORY DATA:  Lab Results  Component Value Date   WBC 5.2 01/23/2021   HGB 13.6 01/23/2021   HCT 41.3 01/23/2021   MCV 91.8 01/23/2021   PLT 206 01/23/2021   Lab Results  Component Value Date   NA 141 01/23/2021   K 3.9 01/23/2021   CL 108 01/23/2021   CO2 25 01/23/2021   Lab Results  Component Value Date   ALT 19 01/23/2021   AST 24 01/23/2021   ALKPHOS 60 01/23/2021   BILITOT 0.6 01/23/2021      RADIOGRAPHY: No results found.     IMPRESSION/PLAN: 1. Intermediate to High grade ER/PR positive DCIS of the left breast with a history of early stage IDC and DCIS that was ER/PR positive, treated with lumpectomy, adjuvant radiotherapy and antiestrogen therapy. Dr. Lisbeth Renshaw discusses the final pathology findings and reviews the nature of early stage breast disease.  We discussed the options of considering  re-irradiation, but at this time given her clear margins, radiotherapy is considered optional. The patient has considered her options especially considering the long term risks due to her prior therapy, and is in agreement that she would like to forgo radiation. We would be happy to see her as needed moving forward if  desired. 2. Postoperative Seroma. I discussed the rationale to wear a compressive sports style bra as much as she can tolerate during the day and even at night, and that I'd like to refer her back to PT for evaluation. I'll message Dr. Brantley Stage as well, but hopefully this will  keep her from needing aspiration. She is in agreement with this plan.  In a visit lasting 45 minutes, greater than 50% of the time was spent face to face discussing the patient's condition, in preparation for the discussion, and coordinating the patient's care.   The above documentation reflects my direct findings during this shared patient visit. Please see the separate note by Dr. Lisbeth Renshaw on this date for the remainder of the patient's plan of care.    Carola Rhine, [facility]    **Disclaimer: This note was dictated with voice recognition software. Similar sounding words can inadvertently be transcribed and this note may contain transcription errors which may not have been corrected upon publication of note.**

## 2021-03-21 ENCOUNTER — Encounter: Payer: Self-pay | Admitting: *Deleted

## 2021-03-21 DIAGNOSIS — D0512 Intraductal carcinoma in situ of left breast: Secondary | ICD-10-CM

## 2021-03-22 ENCOUNTER — Telehealth: Payer: Self-pay

## 2021-03-22 NOTE — Telephone Encounter (Signed)
Called to discuss PREP program, prefers [facility]; offered next December class every M/W 11:30-12:45 starting 12/12; will contact later this month to set up assessment visit

## 2021-03-26 ENCOUNTER — Encounter: Payer: Self-pay | Admitting: Rehabilitation

## 2021-03-26 ENCOUNTER — Other Ambulatory Visit: Payer: Self-pay

## 2021-03-26 ENCOUNTER — Ambulatory Visit: Payer: Medicare Other | Attending: Radiation Oncology | Admitting: Rehabilitation

## 2021-03-26 DIAGNOSIS — C50412 Malignant neoplasm of upper-outer quadrant of left female breast: Secondary | ICD-10-CM | POA: Diagnosis not present

## 2021-03-26 DIAGNOSIS — Z483 Aftercare following surgery for neoplasm: Secondary | ICD-10-CM | POA: Diagnosis not present

## 2021-03-26 NOTE — Therapy (Signed)
[facility] @ [place], [place], [address] Phone: [phone]   Fax:  [phone]  Physical Therapy Evaluation  Patient Details  Name: Meghan [name] MRN: [id number] Date of Birth: 1945-11-12 Referring Provider (PT): Shona Simpson, [place]   Encounter Date: 03/26/2021   PT End of Session - 03/26/21 0948     Visit Number 1    Number of Visits 1    PT Start Time 0900    PT Stop Time 0935    PT Time Calculation (min) 35 min    Activity Tolerance Patient tolerated treatment well    Behavior During Therapy [facility] for tasks assessed/performed             Past Medical History:  Diagnosis Date   Allergy    Arthritis    Breast cancer ([place]) 2006   IDC+DCIS of Left breast; ER/PR+, Her2-; 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%   Cataract    REMOVED 05-2020 AND 06-2020 BILAT   Colon polyps    Hyperplastic    Diverticulosis of colon (without mention of hemorrhage)    Esophageal stricture    GERD (gastroesophageal reflux disease)    History of radiation therapy    History of urinary tract infection    Hypertension    CONTROLLED- OFF MEDS -   Lower back pain    Neuromuscular disorder ([place])    peripheral nueropathy   Numbness    feet bilat at nighttime   Osteopenia    Personal history of radiation therapy 2006   Restless leg syndrome 03/27/2014   Restless legs syndrome (RLS)    Toe pain, right 09/2014   for 2 wks   Tremor, 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   Head and neck tremor   Urinary urgency     Past Surgical History:  Procedure Laterality Date   BREAST LUMPECTOMY Left 2006   BREAST LUMPECTOMY WITH RADIOACTIVE SEED LOCALIZATION Left 02/12/2021   Procedure: LEFT BREAST LUMPECTOMY WITH RADIOACTIVE SEED LOCALIZATION;  Surgeon: Erroll Luna, MD;  Location: [facility];  Service: General;  Laterality: Left;   BREAST SURGERY     left 2006   CESAREAN SECTION     COLONOSCOPY     EYE SURGERY  2022   FRACTURE SURGERY  2017   ORIF  ANKLE FRACTURE Left 08/24/2015   Procedure: OPEN REDUCTION INTERNAL FIXATION (ORIF) LEFT ANKLE FRACTURE;  Surgeon: Mcarthur Rossetti, MD;  Location: [facility];  Service: Orthopedics;  Laterality: Left;  General with a popliteal block   POLYPECTOMY     HPP 2011    There were no vitals filed for this visit.    Subjective Assessment - 03/26/21 0900     Subjective I have noticed some breast swelling across the top and the side.  She said it could be a seroma.    Pertinent History Lt lumpectomy in 2006 with SLNB and then a second lumpectomy on the Left 02/12/21 with no LN removed.  History of radiation in 2006.    Patient Stated Goals can I do anything for swelling    Currently in Pain? No/denies                [facility] PT Assessment - 03/26/21 0001       Assessment   Medical Diagnosis Lt breast cancer    Referring Provider (PT) Shona Simpson, PA-C    Onset Date/Surgical Date 02/12/21    Hand Dominance Right    Next MD Visit Next week  Prior Therapy no      Precautions   Precaution Comments lymphedema risk left      Balance Screen   Has the patient fallen in the past 6 months No    Has the patient had a decrease in activity level because of a fear of falling?  No    Is the patient reluctant to leave their home because of a fear of falling?  No      Home Environment   Living Environment Private residence    Living Arrangements Spouse/significant other      Prior Function   Level of Independence Independent    Vocation Part time employment    Vocation Requirements 3-4 days per week [employer].  Retired Therapist, sports    Leisure nothing      Cognition   Overall Cognitive Status Within Functional Limits for tasks assessed      Observation/Other Assessments   Observations no general breast edema noted      ROM / Strength   AROM / PROM / Strength AROM      AROM   Overall AROM Comments All full and WNL    AROM Assessment Site Shoulder    Right/Left Shoulder Right;Left       Palpation   Palpation comment assessed breast in supine with pt permission: scar tissue vs seroma edge superior to lumpectomy incision                        Objective measurements completed on examination: See above findings.       [facility] Adult PT Treatment/Exercise - 03/26/21 0001       Manual Therapy   Manual Therapy Edema management    Edema Management made pt chip pack and peach for lined rectangle for use in sports bra which seems to be tight enough but also gave her second to nature prescription and information if needed/wanted.  Taught pt short MLD sequence to work on the left breast; Lt axillary nodes, sternal nodes, clavicular nodes and then globally working the seroma out towards those nodes 1-2x per day.                     PT Education - 03/26/21 0948     Education Details POC, use of bra and foam, self MLD    Person(s) Educated Patient    Methods Explanation;Demonstration;Tactile cues;Verbal cues;Handout    Comprehension Verbalized understanding;Returned demonstration;Verbal cues required;Tactile cues required                 PT Long Term Goals - 03/26/21 0951       PT LONG TERM GOAL #1   Title pt will be ind with self care for possible breast seroms    Time 1    Period Days    Status Achieved      PT LONG TERM GOAL #2   Title NA      PT LONG TERM GOAL #3   Title NA      PT LONG TERM GOAL #4   Title NA      PT LONG TERM GOAL #5   Title NA                    Plan - 03/26/21 0949     Clinical Impression Statement Pt presents with mild small seroma/scar tissue superior to her lumpectomy incision.  Pt was educated on self care for this using bra, foam, and  MLD and knows to let Bryson Ha know if it does not improve with these conservative interventions for possible aspiration although it does not feel very full today.  no futhervisits at this time.    Personal Factors and Comorbidities Age;Comorbidity 1     Comorbidities SLNB    Stability/Clinical Decision Making Stable/Uncomplicated    Clinical Decision Making Low    Rehab Potential Excellent    PT Frequency One time visit    PT Treatment/Interventions ADLs/Self Care Home Management;Patient/family education    PT Next Visit Plan as needed    Consulted and Agree with Plan of Care Patient             Patient will benefit from skilled therapeutic intervention in order to improve the following deficits and impairments:  Increased fascial restricitons, Increased edema  Visit Diagnosis: Aftercare following surgery for neoplasm     Problem List Patient Active Problem List   Diagnosis Date Noted   Ductal carcinoma in situ (DCIS) of left breast 01/21/2021   Tremor, 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   Solitary pulmonary nodule 10/12/2015   Closed trimalleolar fracture of left ankle 08/24/2015   Ankle fracture, left 08/24/2015   Trimalleolar fracture of left ankle 08/24/2015   Family history of breast cancer in female 12/28/2014   Family history of ovarian cancer 12/28/2014   Genetic testing 12/07/2014   Restless leg syndrome 03/27/2014   Disturbance of skin sensation 10/20/2013   Malignant neoplasm of female breast ([place]) 04/10/2008   ANXIETY 04/10/2008   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

## 2021-03-29 ENCOUNTER — Other Ambulatory Visit: Payer: Self-pay | Admitting: Neurology

## 2021-04-03 ENCOUNTER — Inpatient Hospital Stay: Payer: Medicare Other | Attending: Oncology | Admitting: Oncology

## 2021-04-03 ENCOUNTER — Telehealth: Payer: Self-pay

## 2021-04-03 ENCOUNTER — Other Ambulatory Visit: Payer: Self-pay

## 2021-04-03 VITALS — BP 158/71 | HR 84 | Temp 97.7°F | Resp 18 | Ht 64.0 in | Wt 196.2 lb

## 2021-04-03 DIAGNOSIS — D0512 Intraductal carcinoma in situ of left breast: Secondary | ICD-10-CM | POA: Insufficient documentation

## 2021-04-03 DIAGNOSIS — M8580 Other specified disorders of bone density and structure, unspecified site: Secondary | ICD-10-CM | POA: Diagnosis not present

## 2021-04-03 DIAGNOSIS — C50412 Malignant neoplasm of upper-outer quadrant of left female breast: Secondary | ICD-10-CM

## 2021-04-03 DIAGNOSIS — Z79899 Other long term (current) drug therapy: Secondary | ICD-10-CM | POA: Diagnosis not present

## 2021-04-03 DIAGNOSIS — Z79811 Long term (current) use of aromatase inhibitors: Secondary | ICD-10-CM | POA: Insufficient documentation

## 2021-04-03 DIAGNOSIS — Z170 Estrogen receptor positive status [ER+]: Secondary | ICD-10-CM | POA: Diagnosis not present

## 2021-04-03 MED ORDER — ANASTROZOLE 1 MG PO TABS: 1.0000 mg | ORAL_TABLET | Freq: Every day | ORAL | 4 refills | Status: DC

## 2021-04-03 NOTE — Telephone Encounter (Signed)
Called to set up PREP assessment visit for next week, left voicemail

## 2021-04-03 NOTE — Progress Notes (Signed)
[facility]  [phone] [phone]    ID: NELI FOFANA DOB: 1945/10/03  MR#: [id number]  PJK#:[id number]  Patient Care Team: Marda Stalker, PA-C as PCP - General (Family Medicine) Mauro Kaufmann, RN as Oncology Nurse Navigator Rockwell Germany, RN as Oncology Nurse Navigator Erroll Luna, MD as Consulting Physician (General Surgery) Hiliana Eilts, Virgie Dad, MD as Consulting Physician (Oncology) Kyung Rudd, MD as Consulting Physician (Radiation Oncology) Kathrynn Ducking, MD as Consulting Physician (Neurology) Mcarthur Rossetti, MD as Consulting Physician (Orthopedic Surgery) Irene Shipper, MD as Consulting Physician (Gastroenterology) Kristeen Miss, MD as Consulting Physician (Neurosurgery) Donato Heinz, MD as Consulting Physician (Cardiology) Maisie Fus, MD as Attending Physician (Obstetrics and Gynecology) Luberta Mutter, MD as Consulting Physician (Ophthalmology) Chauncey Cruel, MD OTHER MD:  CHIEF COMPLAINT: noninvasive breast cancer, estrogen receptor positive  CURRENT TREATMENT:    INTERVAL HISTORY: Kellan returns today for follow up of her noninvasive breast cancer. She was evaluated in the multidisciplinary breast cancer clinic on 01/23/2021.   She proceeded to left lumpectomy on 02/12/2021 under Dr. Brantley Stage. Pathology from the procedure ([id number]) showed: focus of intermediate to high grade ductal carcinoma in situ, 0.2 cm, negative for invasive carcinoma; margins negative.  She was referred back to Dr. Lisbeth Renshaw and PA Bryson Ha on 03/19/2021. Per their discussion, the patient will forego radiation therapy.   REVIEW OF SYSTEMS: Beyonce did very well with the surgery.  She did developed a seroma in the superior aspect of the surgical, left breast.  She is massaging it and hopes that it will reduce.  She is not exercising regularly partly because she has a fracture in her left ankle which is old and was  aggravated again this past summer 2022.  She can walk though and she can use a bike.  Aside from these issues a detailed review of systems was stable.   COVID 19 VACCINATION STATUS: Pfizer x4 as of November 2022   HISTORY OF CURRENT ILLNESS: From the original intake note:  AEMILIA DEDRICK has a history of stage pT1b, 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 She is status post lumpectomy on 10/08/2004 (pathology report in Epic), adjuvant radiation therapy, and 5 years of tamoxifen.   She also underwent genetic around that time, and then repeat updated testing in 12/2014. Her results were negative.  More recently, she had routine screening mammography on 01/04/2021 showing a possible abnormality in the left breast. She underwent left diagnostic mammography with tomography at [facility] on 01/11/2021 showing: breast density category B; 5 mm indeterminate calcifications in upper-outer left breast.  Accordingly on 01/14/2021 she proceeded to biopsy of the left breast area in question. The pathology from this procedure ([id number]) showed: ductal carcinoma in situ, intermediate grade with calcifications. Prognostic indicators significant for: estrogen receptor, 100% positive and progesterone receptor, 30% positive, both with strong staining intensity.    Cancer Staging  Ductal carcinoma in situ (DCIS) of left breast Staging form: Breast, AJCC 8th Edition - Clinical stage from 01/23/2021: Stage 0 (cTis (DCIS), cN0, cM0, ER+, PR+, HER2: Not Assessed) - Signed by Chauncey Cruel, MD on 01/23/2021 Stage prefix: Initial diagnosis Nuclear grade: G2 Laterality: Left Staged by: Pathologist and managing physician Stage used in treatment planning: Yes National guidelines used in treatment planning: Yes Type of national guideline used in treatment planning: NCCN  The patient's subsequent history is as detailed below.   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%   Cataract    REMOVED 05-2020 AND 06-2020 BILAT   Colon polyps    Hyperplastic    Diverticulosis of colon (without mention of hemorrhage)    Esophageal stricture    GERD (gastroesophageal reflux disease)    History of radiation therapy    History of urinary tract infection    Hypertension    CONTROLLED- OFF MEDS -   Lower back pain    Neuromuscular disorder (HCC)    peripheral nueropathy   Numbness    feet bilat at nighttime   Osteopenia    Personal history of radiation therapy 2006   Restless leg syndrome 03/27/2014   Restless legs syndrome (RLS)    Toe pain, right 09/2014   for 2 wks   Tremor, 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   Head and neck tremor   Urinary urgency     PAST SURGICAL HISTORY: Past Surgical History:  Procedure Laterality Date   BREAST LUMPECTOMY Left 2006   BREAST LUMPECTOMY WITH RADIOACTIVE SEED LOCALIZATION Left 02/12/2021   Procedure: LEFT BREAST LUMPECTOMY WITH RADIOACTIVE SEED LOCALIZATION;  Surgeon: Erroll Luna, MD;  Location: [facility];  Service: General;  Laterality: Left;   BREAST SURGERY     left 2006   CESAREAN SECTION     COLONOSCOPY     EYE SURGERY  2022   FRACTURE SURGERY  2017   ORIF ANKLE FRACTURE Left 08/24/2015   Procedure: OPEN REDUCTION INTERNAL FIXATION (ORIF) LEFT ANKLE FRACTURE;  Surgeon: Mcarthur Rossetti, MD;  Location: [facility];  Service: Orthopedics;  Laterality: Left;  General with a popliteal block   POLYPECTOMY     HPP 2011    FAMILY HISTORY: Family History  Problem Relation Age of Onset   Heart attack Mother    Varicose Veins Mother    COPD Father    Heart disease Father    Emphysema Father        smoker   Breast cancer Sister 33       mastectomy; metastasis to liver and lung   Cancer Sister    Obesity Brother    Breast cancer Maternal Aunt        dx. late 10s   Cancer Maternal Aunt     Alzheimer's disease Maternal Aunt    Breast cancer Maternal Aunt        dx. 70s   Squamous cell carcinoma Maternal Aunt        recently removed; dx. 95   Cancer Maternal Aunt    Heart disease Maternal Grandmother    Congestive Heart Failure Maternal Grandmother    Heart disease Paternal Grandmother    Breast cancer Cousin 62       genetic testing in approx 2015   Ovarian cancer Cousin        dx. 62s   Esophageal cancer Cousin        not a smoker   Neuropathy Neg Hx    Thyroid disease Neg Hx    Colon cancer Neg Hx    Colon polyps Neg Hx    Rectal cancer Neg Hx    Stomach cancer Neg Hx    Her father died at age 42 from COPD. Her mother died at age 12 from complications following a fall. Synai has one brother and had one sister. Her sister had a history of breast cancer at age 95 that metastasized  to her liver and lung. In addition to her sister, she reports breast cancer in two maternal aunts (in 70-80's) and a cousin (at age 41). One aunt also has a history of squamous cell skin cancer (at age 27). Kawanda also reports ovarian cancer in another cousin (in her 66's) and esophageal cancer in another cousin in their 84's.   GYNECOLOGIC HISTORY:  No LMP recorded. Patient is postmenopausal. Menarche: 75 years old Age at first live birth: 75 years old [place] P 2 (twins) LMP 1989 Contraceptive: used pills for 10-15 years HRT never used  Hysterectomy? no BSO? 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 She works part-time at [employer] 3-4 times a week. Husband Fredirick Lathe is retired from [employer]. She lives at home with [place]. She has twin sons Nathaneil Canary and Aaron Edelman, who are 75 years old. Nathaneil Canary is a Agricultural consultant in [place], and Aaron Edelman is a Engineer, structural in [place], [place]. Rosenda has 6 grandchildren. She attends [employer].    ADVANCED DIRECTIVES: In the absence of any documentation to the contrary, the patient's spouse is their HCPOA.    HEALTH  MAINTENANCE: Social History   Tobacco Use   Smoking status: Never   Smokeless tobacco: Never  Vaping Use   Vaping Use: Never used  Substance Use Topics   Alcohol use: Yes    Comment: rare occ drink wine   Drug use: No     Colonoscopy: 11/2020 (Dr. Henrene Pastor), recall not recommended  PAP: 11/2019  Bone density: 11/2019   Allergies  Allergen Reactions   Lisinopril Swelling    SOB & swelling    Amlodipine Swelling    Swelling face   Flagyl [Metronidazole] Nausea Only   Nsaids Other (See Comments)    CKD - per PCP   Tetracycline Hcl Other (See Comments)    Current Outpatient Medications  Medication Sig Dispense Refill   Cholecalciferol (VITAMIN D3) 2000 units TABS Take 2,000 Units by mouth daily.      clidinium-chlordiazePOXIDE (LIBRAX) 5-2.5 MG capsule Take 1 capsule by mouth as needed. 30 capsule 3   Coenzyme Q10 (CO Q-10) 100 MG CAPS Take 200 mg by mouth daily.     Cranberry-Vitamin C-Probiotic (AZO CRANBERRY) 250-30 MG TABS Take 1 tablet by mouth 2 (two) times daily.      fluticasone (FLONASE) 50 MCG/ACT nasal spray Place 1 spray into both nostrils daily as needed for allergies or rhinitis.      gabapentin (NEURONTIN) 300 MG capsule TAKE 2 CAPSULES AT BEDTIME 180 capsule 3   Glucosamine HCl (GLUCOSAMINE PO) Take by mouth 2 (two) times daily. 1500/1200 mg     Multiple Vitamin (MULTIVITAMIN) tablet Take 1 tablet by mouth every evening.      Omega-3 Fatty Acids (FISH OIL) 1200 MG CAPS Take 1,200 mg by mouth daily.      pantoprazole (PROTONIX) 40 MG tablet Take 1 tablet (40 mg total) by mouth daily. 90 tablet 3   rosuvastatin (CRESTOR) 10 MG tablet Take 1 tablet (10 mg total) by mouth daily. 90 tablet 1   TURMERIC PO Take 1 capsule by mouth 2 (two) times a day. 1000 mg     No current facility-administered medications for this visit.    OBJECTIVE: White woman in no acute distress  Vitals:   04/03/21 1506  BP: (!) 158/71  Pulse: 84  Resp: 18  Temp: 97.7 F (36.5 C)  SpO2:  98%     Body  mass index is 33.68 kg/m.   Wt Readings from Last 3 Encounters:  04/03/21 196 lb 3.2 oz (89 kg)  03/19/21 194 lb 6.4 oz (88.2 kg)  03/18/21 193 lb (87.5 kg)      ECOG FS:1 - Symptomatic but completely ambulatory  Sclerae unicteric, EOMs intact Wearing a mask No cervical or supraclavicular adenopathy Lungs no rales or rhonchi Heart regular rate and rhythm Abd soft, nontender, positive bowel sounds MSK no focal spinal tenderness, no upper extremity lymphedema Neuro: nonfocal, well oriented, appropriate affect Breasts: The right breast is benign.  The left breast is status postlumpectomy.  The incisions are healing well without erythema or dehiscence.  Superior to the incision there is a 4 x 2 cm rubber density seroma, with no erythema or tenderness.  Both axillae are benign.   LAB RESULTS:  CMP     Component Value Date/Time   NA 141 01/23/2021 0816   NA 140 11/09/2019 0945   K 3.9 01/23/2021 0816   CL 108 01/23/2021 0816   CO2 25 01/23/2021 0816   GLUCOSE 124 (H) 01/23/2021 0816   BUN 16 01/23/2021 0816   BUN 14 11/09/2019 0945   CREATININE 0.96 01/23/2021 0816   CALCIUM 10.2 01/23/2021 0816   PROT 7.1 01/23/2021 0816   PROT 6.7 12/21/2020 0824   ALBUMIN 3.6 01/23/2021 0816   AST 24 01/23/2021 0816   ALT 19 01/23/2021 0816   ALKPHOS 60 01/23/2021 0816   BILITOT 0.6 01/23/2021 0816   GFRNONAA >60 01/23/2021 0816   GFRAA 69 11/09/2019 0945    No results found for: TOTALPROTELP, ALBUMINELP, A1GS, A2GS, BETS, BETA2SER, GAMS, MSPIKE, SPEI  Lab Results  Component Value Date   WBC 5.2 01/23/2021   NEUTROABS 2.4 01/23/2021   HGB 13.6 01/23/2021   HCT 41.3 01/23/2021   MCV 91.8 01/23/2021   PLT 206 01/23/2021    Lab Results  Component Value Date   LABCA2 28 09/05/2009    No components found for: DGLOVF643  No results for input(s): INR in the last 168 hours.  Lab Results  Component Value Date   LABCA2 28 09/05/2009    No results found for:  PIR518  No results found for: ACZ660  No results found for: YTK160  No results found for: CA2729  No components found for: HGQUANT  No results found for: CEA1 / No results found for: CEA1   No results found for: AFPTUMOR  No results found for: CHROMOGRNA  No results found for: KPAFRELGTCHN, LAMBDASER, KAPLAMBRATIO (kappa/lambda light chains)  No results found for: HGBA, HGBA2QUANT, HGBFQUANT, HGBSQUAN (Hemoglobinopathy evaluation)   Lab Results  Component Value Date   LDH 165 09/05/2009    No results found for: IRON, TIBC, IRONPCTSAT (Iron and TIBC)  No results found for: FERRITIN  Urinalysis    Component Value Date/Time   BILIRUBINUR negative 07/02/2012 1528   PROTEINUR negative 07/02/2012 1528   UROBILINOGEN 0.2 07/02/2012 1528   NITRITE negative 07/02/2012 1528   LEUKOCYTESUR Negative 07/02/2012 1528    STUDIES: No results found.   ELIGIBLE FOR AVAILABLE RESEARCH PROTOCOL: No  ASSESSMENT: 75 y.o. [place] woman  (1) s/p left lumpectomy 10/08/2004 for a pT1b pN0, stage IA invasive ductal carcinoma, grade 2, estrogen and progesterone receptor positive, HER2 not amplified, with an MIB-1 of 17%  (A) status post adjuvant MammoSite radiation  (B) status post tamoxifen for 5 years  (2) genetics testing 2006, updated 2018: No deleterious mutations  (3) status post left breast biopsy 01/14/2021 for  ductal carcinoma in situ, grade 2, estrogen and progesterone receptor positive.  (4) status post left lumpectomy 02/12/2021 showing a 0.2 cm focus of ductal carcinoma in situ, intermediate to high-grade, with negative margins.  (5) opted against adjuvant radiation   (6) anastrozole started 04/04/2021   PLAN: Nyela did fine with her surgery other than the seroma.  She understands this could be permanent although frequently it gets reabsorbed.  I would say if she continues to massage it and it does not improve much in the next couple of weeks she should go back  to Dr. Ninfa Linden and ask his opinion on management.  We discussed the difference between tamoxifen and anastrozole in detail. She understands that anastrozole and the aromatase inhibitors in general work by blocking estrogen production. Accordingly vaginal dryness, decrease in bone density, and of course hot flashes can result. The aromatase inhibitors can also negatively affect the cholesterol profile, although that is a minor effect. One out of 5 women on aromatase inhibitors we will feel "old and achy". This arthralgia/myalgia syndrome, which resembles fibromyalgia clinically, does resolve with stopping the medications. Accordingly this is not a reason to not try an aromatase inhibitor but it is a frequent reason to stop it (in other words 20% of women will not be able to tolerate these medications).  Tamoxifen on the other hand does not block estrogen production. It does not "take away a woman's estrogen". It blocks the estrogen receptor in breast cells. Like anastrozole, it can also cause hot flashes. As opposed to anastrozole, tamoxifen has many estrogen-like effects. It is technically an estrogen receptor modulator. This means that in some tissues tamoxifen works like estrogen-- for example it helps strengthen the bones. It tends to improve the cholesterol profile. It can cause thickening of the endometrial lining, and even endometrial polyps or rarely cancer of the uterus.(The risk of uterine cancer due to tamoxifen is one additional cancer per thousand women year). It can cause vaginal wetness or stickiness. It can cause blood clots through this estrogen-like effect--the risk of blood clots with tamoxifen is exactly the same as with birth control pills or hormone replacement.  Neither of these agents causes mood changes or weight gain, despite the popular belief that they can have these side effects. We have data from studies comparing either of these drugs with placebo, and in those cases the control  group had the same amount of weight gain and depression as the group that took the drug.  We are going to give anastrozole a try.  If she has any problems she will let us know.  Otherwise she will see [place] again in February just to review tolerance.  She will need a bone density when she sees her gynecologist in August.  She will have her mammogram at that time as well.    Total encounter time 25 minutes.Sarajane Jews C. Chesley Veasey, MD 04/03/2021 3:11 PM Medical Oncology and Hematology [facility] [place], [place] [address] Tel. [phone]    Fax. [phone]   This document serves as a record of services personally performed by Lurline Del, MD. It was created on his behalf by Wilburn Mylar, a trained medical scribe. The creation of this record is based on the scribe's personal observations and the provider's statements to them.   I, Lurline Del MD, have reviewed the above documentation for accuracy and completeness, and I agree with the above.   *Total Encounter Time as defined by the Centers  for Medicare and Medicaid Services includes, in addition to the face-to-face time of a patient visit (documented in the note above) non-face-to-face time: obtaining and reviewing outside history, ordering and reviewing medications, tests or procedures, care coordination (communications with other health care professionals or caregivers) and documentation in the medical record.

## 2021-04-04 ENCOUNTER — Telehealth: Payer: Self-pay

## 2021-04-04 ENCOUNTER — Telehealth: Payer: Self-pay | Admitting: Oncology

## 2021-04-04 NOTE — Telephone Encounter (Signed)
Scheduled appointment per 11/30 los. Patient aware.  

## 2021-04-04 NOTE — Telephone Encounter (Signed)
PREP Assessment visit scheduled for Monday 12/5 at 3pm

## 2021-04-05 ENCOUNTER — Other Ambulatory Visit: Payer: Medicare Other

## 2021-04-05 ENCOUNTER — Ambulatory Visit
Admission: RE | Admit: 2021-04-05 | Discharge: 2021-04-05 | Disposition: A | Discharge status: Home / Self Care | Payer: Medicare Other | Source: Ambulatory Visit | Attending: Cardiology | Admitting: Cardiology

## 2021-04-05 DIAGNOSIS — R918 Other nonspecific abnormal finding of lung field: Secondary | ICD-10-CM

## 2021-04-05 DIAGNOSIS — R911 Solitary pulmonary nodule: Secondary | ICD-10-CM | POA: Diagnosis not present

## 2021-04-05 DIAGNOSIS — J9811 Atelectasis: Secondary | ICD-10-CM | POA: Diagnosis not present

## 2021-04-05 DIAGNOSIS — I700 Atherosclerosis of aorta: Secondary | ICD-10-CM | POA: Diagnosis not present

## 2021-04-08 ENCOUNTER — Encounter: Payer: Self-pay | Admitting: Oncology

## 2021-04-08 NOTE — Progress Notes (Signed)
[facility] PREP Evaluation  Patient Details  Name: Meghan [name] MRN: [id number] Date of Birth: 1945-09-16 Age: 75 y.o. PCP: Marda Stalker, PA-C  Vitals:   04/08/21 1503  BP: (!) 160/64  Pulse: 88  SpO2: 98%  Weight: 194 lb 9.6 oz (88.3 kg)     [facility] Eval - 04/08/21 1500       [facility] "PREP" Location   [facility] "PREP" Location [facility]      Referral    Referring Provider Gardiner Rhyme    Reason for referral Inactivity;Obesitity/Overweight    Program Start Date 04/15/21      Measurement   Waist Circumference 44.5 inches    Hip Circumference 48 inches    Body fat 46.3 percent      Information for Trainer   Goals --   Maintain weight (during December) lose 10 lbs by end of program; establish exercise routine   Current Exercise --   "not much"   Orthopedic Concerns --   spondylothesis; s/p ORIF ankle fracture; arthritis   Pertinent Medical History --   Breast CA, S/P lumpectomy X2, most recent 10/22; HTN   Current Barriers work      Timed Up and Go (TUGS)   Timed Up and Go Low risk <9 seconds      Mobility and Daily Activities   I find it easy to walk up or down two or more flights of stairs. 2    I have no trouble taking out the trash. 4    I do housework such as vacuuming and dusting on my own without difficulty. 4    I can easily lift a gallon of milk (8lbs). 4    I can easily walk a mile. 2    I have no trouble reaching into high cupboards or reaching down to pick up something from the floor. 2    I do not have trouble doing out-door work such as Armed forces logistics/support/administrative officer, raking leaves, or gardening. 1      Mobility and Daily Activities   I feel younger than my age. 2    I feel independent. 4    I feel energetic. 2    I live an active life.  1    I feel strong. 1    I feel healthy. 1    I feel active as other people my age. 4      How fit and strong are you.   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%   Cataract    REMOVED 05-2020 AND 06-2020 BILAT   Colon polyps    Hyperplastic    Diverticulosis of colon (without mention of hemorrhage)    Esophageal stricture    GERD (gastroesophageal reflux disease)    History of radiation therapy    History of urinary tract infection    Hypertension    CONTROLLED- OFF MEDS -   Lower back pain    Neuromuscular disorder (HCC)    peripheral nueropathy   Numbness    feet bilat at nighttime   Osteopenia    Personal history of radiation therapy 2006   Restless leg syndrome 03/27/2014   Restless legs syndrome (RLS)    Toe pain, right 09/2014   for 2 wks   Tremor,  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   Head and neck tremor   Urinary urgency    Past Surgical History:  Procedure Laterality Date   BREAST LUMPECTOMY Left 2006   BREAST LUMPECTOMY WITH RADIOACTIVE SEED LOCALIZATION Left 02/12/2021   Procedure: LEFT BREAST LUMPECTOMY WITH RADIOACTIVE SEED LOCALIZATION;  Surgeon: Erroll Luna, MD;  Location: [facility];  Service: General;  Laterality: Left;   BREAST SURGERY     left 2006   CESAREAN SECTION     COLONOSCOPY     EYE SURGERY  2022   FRACTURE SURGERY  2017   ORIF ANKLE FRACTURE Left 08/24/2015   Procedure: OPEN REDUCTION INTERNAL FIXATION (ORIF) LEFT ANKLE FRACTURE;  Surgeon: Mcarthur Rossetti, MD;  Location: [facility];  Service: Orthopedics;  Laterality: Left;  General with a popliteal block   POLYPECTOMY     HPP 2011   Social History   Tobacco Use  Smoking Status Never  Smokeless Tobacco Never  To begin PREP classes at [facility] 12/12, every M/W 11:30-12:45  Laquon Emel B Trinia Georgi 04/08/2021, 3:08 PM

## 2021-04-09 ENCOUNTER — Other Ambulatory Visit: Payer: Self-pay

## 2021-04-09 MED ORDER — ANASTROZOLE 1 MG PO TABS: 1.0000 mg | ORAL_TABLET | Freq: Every day | ORAL | 4 refills | Status: DC

## 2021-04-09 NOTE — Progress Notes (Signed)
Sent Rx for anastrozole to local [facility] because [facility] not have in stock.

## 2021-04-15 NOTE — Progress Notes (Signed)
[facility] PREP Weekly Session  Patient Details  Name: Meghan [name] MRN: [id number] Date of Birth: [birth year] Age: 75 y.o. PCP: Marda Stalker, PA-C  There were no vitals filed for this visit.   [facility] Weekly seesion - 04/15/21 1300       [facility] "PREP" Location   [facility] "PREP" Location [facility]      Weekly Session   Topic Discussed Goal setting and welcome to the program   tour of facility   Classes attended to date [place] 04/15/2021, 1:16 PM

## 2021-04-17 ENCOUNTER — Encounter: Payer: Self-pay | Admitting: Cardiology

## 2021-04-22 NOTE — Progress Notes (Signed)
[facility] PREP Weekly Session  Patient Details  Name: Meghan [name] MRN: [id number] Date of Birth: 11-02-1945 Age: 75 y.o. PCP: Marda Stalker, PA-C  Vitals:   04/22/21 1305  Weight: 188 lb (85.3 kg)     [facility] Weekly seesion - 04/22/21 1300       [facility] "PREP" Location   [facility] "PREP" Location [facility]      Weekly Session   Topic Discussed Importance of resistance training;Other ways to be active   150 minutes cardio/wk; strength training 2-3 times/wk for 20-40 minutes; sitting no longer than 30 minutes   Minutes exercised this week 30 minutes    Classes attended to date [place] 04/22/2021, 1:05 PM

## 2021-04-30 ENCOUNTER — Encounter: Payer: Self-pay | Admitting: Oncology

## 2021-05-02 ENCOUNTER — Encounter: Payer: Self-pay | Admitting: Oncology

## 2021-05-06 NOTE — Progress Notes (Signed)
[facility] PREP Weekly Session  Patient Details  Name: Meghan [name] MRN: [id number] Date of Birth: 08-23-1945 Age: 76 y.o. PCP: Marda Stalker, PA-C  Vitals:   05/06/21 1457  Weight: 189 lb (85.7 kg)     [facility] Weekly seesion - 05/06/21 1400       [facility] "PREP" Location   [facility] "PREP" Location [facility]      Weekly Session   Topic Discussed Healthy eating tips    Minutes exercised this week 360 minutes    Classes attended to date [place] 05/06/2021, 2:59 PM

## 2021-05-07 ENCOUNTER — Encounter: Payer: Self-pay | Admitting: Hematology and Oncology

## 2021-05-08 ENCOUNTER — Telehealth: Payer: Self-pay | Admitting: *Deleted

## 2021-05-08 NOTE — Telephone Encounter (Signed)
This RN spoke with pt per her previous My Chart message regarding " feeling swimmy headed " post starting anastrozole.  She states symptoms have improved since holding the medication but not fully.  Per phone discussion- she states severity with interference with her ADL's with symptoms.  Per discussion - she will stay off the anastrozole and appt made with LCC/NP to discuss further and possible other medications.

## 2021-05-13 NOTE — Progress Notes (Signed)
[facility] PREP Weekly Session  Patient Details  Name: Meghan [name] MRN: [id number] Date of Birth: 04-Dec-1945 Age: 76 y.o. PCP: Marda Stalker, PA-C  Vitals:   05/13/21 1259  Weight: 186 lb (84.4 kg)     [facility] Weekly seesion - 05/13/21 1200       [facility] "PREP" Location   [facility] "PREP" Location [facility]      Weekly Session   Topic Discussed Health habits   sugar demo   Minutes exercised this week 190 minutes    Classes attended to date [place] 05/13/2021, 12:59 PM

## 2021-05-14 ENCOUNTER — Other Ambulatory Visit: Payer: Self-pay | Admitting: Cardiology

## 2021-05-14 NOTE — Telephone Encounter (Signed)
This is Dr. Schumann's pt 

## 2021-05-17 ENCOUNTER — Telehealth: Payer: Self-pay | Admitting: *Deleted

## 2021-05-18 ENCOUNTER — Telehealth: Payer: Self-pay | Admitting: Adult Health

## 2021-05-18 NOTE — Telephone Encounter (Signed)
Received phone call form patient about her upcoming 1/23 appt.  We will change to SCP visit.  She verbalized understanding. Scheduling message sent.   Wilber Bihari, NP

## 2021-05-18 NOTE — Telephone Encounter (Signed)
Called and [facility] about changing her upcoming appointment from 1/23 to 1/24 and in addition to discussing her medication effects also reviewing her breast cancer survivorship care plan.  Gave patient my call back # [phone].    Wilber Bihari, NP

## 2021-05-20 ENCOUNTER — Telehealth: Payer: Self-pay | Admitting: *Deleted

## 2021-05-20 NOTE — Progress Notes (Signed)
[facility] PREP Weekly Session  Patient Details  Name: Meghan [name] MRN: [id number] Date of Birth: 1945-08-28 Age: 76 y.o. PCP: Marda Stalker, PA-C  Vitals:   05/20/21 1511  Weight: 184 lb (83.5 kg)     [facility] Weekly seesion - 05/20/21 1500       [facility] "PREP" Location   [facility] "PREP" Location [facility]      Weekly Session   Topic Discussed Restaurant Eating   Salt demo; limit sodium intake to 1500-2300 mg. daily   Minutes exercised this week 256 minutes    Classes attended to date [place] 05/20/2021, 3:12 PM

## 2021-05-24 ENCOUNTER — Other Ambulatory Visit: Payer: Self-pay | Admitting: Cardiology

## 2021-05-25 ENCOUNTER — Encounter: Payer: Self-pay | Admitting: Adult Health

## 2021-05-25 DIAGNOSIS — Z860100 Personal history of colon polyps, unspecified: Secondary | ICD-10-CM | POA: Insufficient documentation

## 2021-05-25 DIAGNOSIS — N1831 Chronic kidney disease, stage 3a: Secondary | ICD-10-CM | POA: Insufficient documentation

## 2021-05-25 DIAGNOSIS — E042 Nontoxic multinodular goiter: Secondary | ICD-10-CM | POA: Insufficient documentation

## 2021-05-25 DIAGNOSIS — R7301 Impaired fasting glucose: Secondary | ICD-10-CM | POA: Insufficient documentation

## 2021-05-25 DIAGNOSIS — M8580 Other specified disorders of bone density and structure, unspecified site: Secondary | ICD-10-CM | POA: Insufficient documentation

## 2021-05-25 DIAGNOSIS — Z6835 Body mass index (BMI) 35.0-35.9, adult: Secondary | ICD-10-CM | POA: Insufficient documentation

## 2021-05-27 ENCOUNTER — Encounter: Payer: Self-pay | Admitting: Adult Health

## 2021-05-27 ENCOUNTER — Other Ambulatory Visit: Payer: Self-pay

## 2021-05-27 ENCOUNTER — Inpatient Hospital Stay: Payer: Medicare Other | Attending: Oncology | Admitting: Adult Health

## 2021-05-27 ENCOUNTER — Ambulatory Visit: Payer: Medicare Other | Admitting: Adult Health

## 2021-05-27 VITALS — BP 156/73 | HR 78 | Temp 97.6°F | Resp 16 | Ht 64.0 in | Wt 184.4 lb

## 2021-05-27 DIAGNOSIS — D0512 Intraductal carcinoma in situ of left breast: Secondary | ICD-10-CM | POA: Diagnosis not present

## 2021-05-27 DIAGNOSIS — Z170 Estrogen receptor positive status [ER+]: Secondary | ICD-10-CM | POA: Insufficient documentation

## 2021-05-27 DIAGNOSIS — Z923 Personal history of irradiation: Secondary | ICD-10-CM | POA: Diagnosis not present

## 2021-05-27 DIAGNOSIS — Z79899 Other long term (current) drug therapy: Secondary | ICD-10-CM | POA: Diagnosis not present

## 2021-05-27 DIAGNOSIS — I10 Essential (primary) hypertension: Secondary | ICD-10-CM | POA: Diagnosis not present

## 2021-05-27 DIAGNOSIS — M8580 Other specified disorders of bone density and structure, unspecified site: Secondary | ICD-10-CM | POA: Insufficient documentation

## 2021-05-27 DIAGNOSIS — Z79810 Long term (current) use of selective estrogen receptor modulators (SERMs): Secondary | ICD-10-CM | POA: Diagnosis not present

## 2021-05-27 NOTE — Progress Notes (Signed)
[facility] PREP Weekly Session  Patient Details  Name: Meghan [name] MRN: [id number] Date of Birth: 04/30/1946 Age: 76 y.o. PCP: Marda Stalker, PA-C  Vitals:   05/27/21 1310  Weight: 183 lb 3.2 oz (83.1 kg)     [facility] Weekly seesion - 05/27/21 1300       [facility] "PREP" Location   [facility] "PREP" Location [facility]      Weekly Session   Topic Discussed Stress management and problem solving   fingertip/breath work Advertising account planner; fruit bowl guided meditation   Minutes exercised this week 205 minutes    Classes attended to date [place] 05/27/2021, 1:11 PM

## 2021-05-27 NOTE — Progress Notes (Signed)
SURVIVORSHIP VISIT:   BRIEF ONCOLOGIC HISTORY:  Oncology History  Ductal carcinoma in situ (DCIS) of left breast  10/08/2004 Surgery   s/p left lumpectomy 10/08/2004 for a pT1b pN0, stage IA invasive ductal carcinoma, grade 2, estrogen and progesterone receptor positive, HER2 not amplified, with an MIB-1 of 17%             (A) status post adjuvant MammoSite radiation             (B) status post tamoxifen for 5 years   12/22/2014 Genetic Testing   Genes tested: ATM, BARD1, BRCA1, BRCA2, BRIP1, CDH1, CHEK2, FANCC, MLH1, MSH2, MSH6, NBN, PALB2, PMS2, PTEN, RAD51C, RAD51D, TP53, and XRCC2.  .  Genetic testing was normal, and did not reveal a deleterious mutation in these genes.  Additionally, no variants of uncertain significance (VUSs) were found   01/14/2021 Initial Biopsy   Diagnosis Breast, left, needle core biopsy, upper outer, X clip - DUCTAL CARCINOMA IN SITU, INTERMEDIATE GRADE WITH CALCIFICATIONS. SEE NOTE Diagnosis Note DCIS measures 0.4 cm in greatest linear dimension  PROGNOSTIC INDICATORS Results: Estrogen Receptor: 100%, POSITIVE, STRONG STAINING INTENSITY Progesterone Receptor: 30%, POSITIVE, STRONG STAINING INTENSITY   01/23/2021 Cancer Staging   Staging form: Breast, AJCC 8th Edition - Clinical stage from 01/23/2021: Stage 0 (cTis (DCIS), cN0, cM0, ER+, PR+, HER2: Not Assessed) - Signed by Chauncey Cruel, MD on 01/23/2021 Stage prefix: Initial diagnosis Nuclear grade: G2 Laterality: Left Staged by: Pathologist and managing physician Stage used in treatment planning: Yes National guidelines used in treatment planning: Yes Type of national guideline used in treatment planning: NCCN    02/12/2021 Cancer Staging   Staging form: Breast, AJCC 8th Edition - Pathologic stage from 02/12/2021: Stage 0 (pTis (DCIS), pN0, cM0, ER+, PR+) - Signed by Gardenia Phlegm, NP on 05/25/2021 Stage prefix: Initial diagnosis    02/12/2021 Definitive Surgery   FINAL MICROSCOPIC  DIAGNOSIS:   A. BREAST, LEFT, LUMPECTOMY:  - Focus of intermediate to high-grade ductal carcinoma in situ  - Negative for invasive carcinoma  - Resection margins are negative for DCIS  - Biopsy site changes  - See oncology table   B. BREAST, LEFT ADDITIONAL SUPERIOR MARGIN, EXCISION:  - Benign breast parenchyma with no specific histopathologic changes.  - Negative for carcinoma   C. BREAST, LEFT ADDITIONAL MEDIAL MARGIN, EXCISION:  - Benign breast parenchyma with no specific histopathologic changes.  - Negative for carcinoma   D. BREAST, LEFT ADDITIONAL INFERIOR MARGIN, EXCISION:  - Benign breast parenchyma with no specific histopathologic changes.  - Negative for carcinoma   E. BREAST, LEFT ADDITIONAL LATERAL MARGIN, EXCISION:  - Benign breast parenchyma with no specific histopathologic changes.  - Negative for carcinoma   F. BREAST, LEFT ADDITIONAL POSTERIOR MARGIN, EXCISION:  - Benign breast parenchyma with no specific histopathologic changes.  - Negative for carcinoma    COMMENT:  A.  Focus of DCIS measures 0.2 cm in greatest dimension.    04/2021 - 04/2021 Anti-estrogen oral therapy   anastrozole daily-unable to tolerate, caused dizziness     INTERVAL HISTORY:  Ms. Cragun to review her survivorship care plan detailing her treatment course for breast cancer, as well as monitoring long-term side effects of that treatment, education regarding health maintenance, screening, and overall wellness and health promotion.     Overall, Ms. Westerman reports feeling quite well   REVIEW OF SYSTEMS:  Review of Systems  Constitutional:  Negative for appetite change, chills, fatigue, fever and unexpected weight change.  HENT:   Negative for hearing loss, lump/mass and trouble swallowing.   Eyes:  Negative for eye problems and icterus.  Respiratory:  Negative for chest tightness, cough and shortness of breath.   Cardiovascular:  Negative for chest pain, leg swelling and  palpitations.  Gastrointestinal:  Negative for abdominal distention, abdominal pain, constipation, diarrhea, nausea and vomiting.  Endocrine: Negative for hot flashes.  Genitourinary:  Negative for difficulty urinating.   Musculoskeletal:  Negative for arthralgias.  Skin:  Negative for itching and rash.  Neurological:  Negative for dizziness, extremity weakness, headaches and numbness.  Hematological:  Negative for adenopathy. Does not bruise/bleed easily.  Psychiatric/Behavioral:  Negative for depression. The patient is not nervous/anxious.   Breast: Denies any new nodularity, masses, tenderness, nipple changes, or nipple discharge.      ONCOLOGY TREATMENT TEAM:  1. Surgeon:  Dr. Brantley Stage at [facility] 2. Medical Oncologist: Dr. Lindi Adie 3. Radiation Oncologist: Dr. Lisbeth Renshaw    PAST MEDICAL/SURGICAL HISTORY:  Past Medical History:  Diagnosis Date   Allergy    Arthritis    Breast cancer ([place]) 2006   IDC+DCIS of Left breast; ER/PR+, Her2-; Ki67=17%   Cataract    REMOVED 05-2020 AND 06-2020 BILAT   Colon polyps    Hyperplastic    Diverticulosis of colon (without mention of hemorrhage)    Esophageal stricture    GERD (gastroesophageal reflux disease)    History of radiation therapy    History of urinary tract infection    Hypertension    CONTROLLED- OFF MEDS -   Lower back pain    Neuromuscular disorder (HCC)    peripheral nueropathy   Numbness    feet bilat at nighttime   Osteopenia    Personal history of radiation therapy 2006   Restless leg syndrome 03/27/2014   Tremor, 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   Head and neck tremor   Trimalleolar fracture of left ankle 08/24/2015   Urinary urgency    Past Surgical History:  Procedure Laterality Date   BREAST LUMPECTOMY Left 2006   BREAST LUMPECTOMY WITH RADIOACTIVE SEED LOCALIZATION Left 02/12/2021   Procedure: LEFT BREAST LUMPECTOMY WITH RADIOACTIVE SEED LOCALIZATION;  Surgeon: Erroll Luna, MD;  Location: [place];  Service: General;  Laterality: Left;   BREAST SURGERY     left 2006   CESAREAN SECTION     COLONOSCOPY     EYE SURGERY  2022   FRACTURE SURGERY  2017   ORIF ANKLE FRACTURE Left 08/24/2015   Procedure: OPEN REDUCTION INTERNAL FIXATION (ORIF) LEFT ANKLE FRACTURE;  Surgeon: Mcarthur Rossetti, MD;  Location: [facility];  Service: Orthopedics;  Laterality: Left;  General with a popliteal block   POLYPECTOMY     HPP 2011     ALLERGIES:  Allergies  Allergen Reactions   Lisinopril Swelling    SOB & swelling    Amlodipine Swelling    Swelling face   Flagyl [Metronidazole] Nausea Only   Nsaids Other (See Comments)    CKD - per PCP Other reaction(s): Other (See Comments) CKD per PCP   Tetracycline Hcl Other (See Comments)     CURRENT MEDICATIONS:  Outpatient Encounter Medications as of 05/27/2021  Medication Sig   Cholecalciferol (VITAMIN D3) 2000 units TABS Take 2,000 Units by mouth daily.    Coenzyme Q10 (CO Q-10) 100 MG CAPS Take 200 mg by mouth daily.   Cranberry-Vitamin C-Probiotic (AZO CRANBERRY) 250-30 MG TABS Take 1 tablet by mouth 2 (two) times daily.    gabapentin (  NEURONTIN) 300 MG capsule TAKE 2 CAPSULES AT BEDTIME   Glucosamine HCl (GLUCOSAMINE PO) Take by mouth 2 (two) times daily. 1500/1200 mg   Multiple Vitamin (MULTIVITAMIN) tablet Take 1 tablet by mouth every evening.    Omega-3 Fatty Acids (FISH OIL) 1200 MG CAPS Take 1,200 mg by mouth daily.    pantoprazole (PROTONIX) 40 MG tablet Take 1 tablet (40 mg total) by mouth daily.   rosuvastatin (CRESTOR) 10 MG tablet TAKE 1 TABLET DAILY (NEED TO SCHEDULE OFFICE APPOINTMENT FOR FUTURE REFILLS)   TURMERIC PO Take 1 capsule by mouth 2 (two) times a day. 1000 mg   clidinium-chlordiazePOXIDE (LIBRAX) 5-2.5 MG capsule Take 1 capsule by mouth as needed. (Patient not taking: Reported on 05/27/2021)   fluticasone (FLONASE) 50 MCG/ACT nasal spray Place 1 spray into both nostrils daily as needed for allergies or  rhinitis.  (Patient not taking: Reported on 05/27/2021)   [DISCONTINUED] anastrozole (ARIMIDEX) 1 MG tablet Take 1 tablet (1 mg total) by mouth daily.   No facility-administered encounter medications on file as of 05/27/2021.     ONCOLOGIC FAMILY HISTORY:  Family History  Problem Relation Age of Onset   Heart attack Mother    Varicose Veins Mother    COPD Father    Heart disease Father    Emphysema Father        smoker   Breast cancer Sister 4       mastectomy; metastasis to liver and lung   Cancer Sister    Obesity Brother    Breast cancer Maternal Aunt        dx. late 87s   Cancer Maternal Aunt    Alzheimer's disease Maternal Aunt    Breast cancer Maternal Aunt        dx. 70s   Squamous cell carcinoma Maternal Aunt        recently removed; dx. 95   Cancer Maternal Aunt    Heart disease Maternal Grandmother    Congestive Heart Failure Maternal Grandmother    Heart disease Paternal Grandmother    Breast cancer Cousin 62       genetic testing in approx 2015   Ovarian cancer Cousin        dx. 44s   Esophageal cancer Cousin        not a smoker   Neuropathy Neg Hx    Thyroid disease Neg Hx    Colon cancer Neg Hx    Colon polyps Neg Hx    Rectal cancer Neg Hx    Stomach cancer Neg Hx      GENETIC COUNSELING/TESTING: In 2016, Negative  SOCIAL HISTORY:  Social History   Socioeconomic History   Marital status: Married    Spouse name: Not on file   Number of children: 2   Years of education: AS   Highest education level: Not on file  Occupational History   Occupation: Programmer, multimedia: [employer]   Occupation: Retired  Tobacco Use   Smoking status: Never   Smokeless tobacco: Never  Scientific laboratory technician Use: Never used  Substance and Sexual Activity   Alcohol use: Yes    Comment: rare occ drink wine   Drug use: No   Sexual activity: Yes  Other Topics Concern   Not on file  Social History Narrative   Lives at home w/ her husband   Right-handed    Occasionally drinks tea   Social Determinants of Health   Financial Resource Strain: Low  Risk    Difficulty of Paying Living Expenses: Not very hard  Food Insecurity: No Food Insecurity   Worried About Running Out of Food in the Last Year: Never true   Ran Out of Food in the Last Year: Never true  Transportation Needs: No Transportation Needs   Lack of Transportation (Medical): No   Lack of Transportation (Non-Medical): No  Physical Activity: Sufficiently Active   Days of Exercise per Week: 3 days   Minutes of Exercise per Session: 90 min  Stress: Not on file  Social Connections: Socially Integrated   Frequency of Communication with Friends and Family: More than three times a week   Frequency of Social Gatherings with Friends and Family: Three times a week   Attends Religious Services: More than 4 times per year   Active Member of Clubs or Organizations: Yes   Attends Archivist Meetings: 1 to 4 times per year   Marital Status: Married  Human resources officer Violence: Not At Risk   Fear of Current or Ex-Partner: No   Emotionally Abused: No   Physically Abused: No   Sexually Abused: No     OBSERVATIONS/OBJECTIVE:  BP (!) 156/73 (BP Location: Right Arm, Patient Position: Sitting)    Pulse 78    Temp 97.6 F (36.4 C) (Temporal)    Resp 16    Ht 5' 4"  (1.626 m)    Wt 184 lb 6 oz (83.6 kg)    SpO2 100%    BMI 31.65 kg/m  GENERAL: Patient is a well appearing female in no acute distress HEENT:  Sclerae anicteric.  Oropharynx clear and moist. No ulcerations or evidence of oropharyngeal candidiasis. Neck is supple.  NODES:  No cervical, supraclavicular, or axillary lymphadenopathy palpated.  BREAST EXAM:  left breast s/p lumpectomy, no sign of local recurrence, right breast is benign LUNGS:  Clear to auscultation bilaterally.  No wheezes or rhonchi. HEART:  Regular rate and rhythm. No murmur appreciated. ABDOMEN:  Soft, nontender.  Positive, normoactive bowel sounds. No  organomegaly palpated. MSK:  No focal spinal tenderness to palpation. Full range of motion bilaterally in the upper extremities. EXTREMITIES:  No peripheral edema.   SKIN:  Clear with no obvious rashes or skin changes. No nail dyscrasia. NEURO:  Nonfocal. Well oriented.  Appropriate affect.   LABORATORY DATA:  None for this visit.  DIAGNOSTIC IMAGING:  None for this visit.      ASSESSMENT AND PLAN:  Ms.. Acre is a pleasant 76 y.o. female with Stage 0 left breast DCIS, ER+/PR+, diagnosed in 01/2021, treated with lumpectomy, and anti-estrogen therapy with Anastrozole beginning in 04/2021.  She presents to the Survivorship Clinic for our initial meeting and routine follow-up post-completion of treatment for breast cancer.    1. Stage 0 left breast cancer:  Ms. Leisinger is continuing to recover from definitive treatment for breast cancer. She will follow-up with her medical oncologist, Dr. Lindi Adie in 11/2021 with history and physical exam per surveillance protocol.  She has opted to stop taking the anastrozole.  We discussed tamoxifen, however this caused significant hot flashes and her, for now she will go antiestrogen therapy.  Her mammogram is due 01/2022; orders placed today.   Today, a comprehensive survivorship care plan and treatment summary was reviewed with the patient today detailing her breast cancer diagnosis, treatment course, potential late/long-term effects of treatment, appropriate follow-up care with recommendations for the future, and patient education resources.  A copy of this summary, along with a letter will be  sent to the patients primary care provider via mail/fax/In Basket message after todays visit.    2. Bone health:  Given Ms. Bond's age/history of breast cancer and her current treatment regimen including anti-estrogen therapy with Anastrozole, she is at risk for bone demineralization.  She will continue bone density testing with Dr. Verlon Au office.  She was given  education on specific activities to promote bone health.  3. Cancer screening:  Due to Ms. Balistreri's history and her age, she should receive screening for skin cancers, colon cancer.  The information and recommendations are listed on the patient's comprehensive care plan/treatment summary and were reviewed in detail with the patient.    4. Health maintenance and wellness promotion: Ms. Audi was encouraged to consume 5-7 servings of fruits and vegetables per day. We reviewed the "Nutrition Rainbow" handout.  She was also encouraged to engage in moderate to vigorous exercise for 30 minutes per day most days of the week. We discussed the [employer] [facility] fitness program, which is designed for cancer survivors to help them become more physically fit after cancer treatments.  She was instructed to limit her alcohol consumption and continue to abstain from tobacco use.     5. Support services/counseling: It is not uncommon for this period of the patient's cancer care trajectory to be one of many emotions and stressors. She was given information regarding our available services and encouraged to contact me with any questions or for help enrolling in any of our support group/programs.    Follow up instructions:    -Return to cancer center in 11/2021 for f/u with Dr. Lindi Adie  -Mammogram due in 12/2021 -Follow up with surgery 1 year -She is welcome to return back to the Survivorship Clinic at any time; no additional follow-up needed at this time.  -Consider referral back to survivorship as a long-term survivor for continued surveillance  The patient was provided an opportunity to ask questions and all were answered. The patient agreed with the plan and demonstrated an understanding of the instructions.   Total encounter time: 45 minutes in face-to-face visit time, chart review, lab review, care coordination, and documentation of the encounter.  Wilber Bihari, NP 05/27/21 2:14 PM Medical Oncology and  Hematology [facility] [place], [place] [address] Tel. [phone]    Fax. [phone]  *Total Encounter Time as defined by the Centers for Medicare and Medicaid Services includes, in addition to the face-to-face time of a patient visit (documented in the note above) non-face-to-face time: obtaining and reviewing outside history, ordering and reviewing medications, tests or procedures, care coordination (communications with other health care professionals or caregivers) and documentation in the medical record.

## 2021-05-28 ENCOUNTER — Telehealth: Payer: Self-pay | Admitting: Adult Health

## 2021-05-28 NOTE — Telephone Encounter (Signed)
Scheduled appointment per 1/23 los. Left message. Patient will be mailed an updated calendar.

## 2021-06-03 NOTE — Progress Notes (Signed)
[facility] PREP Weekly Session  Patient Details  Name: INDICA MARCOTT MRN: [id number] Date of Birth: 03-15-1946 Age: 76 y.o. PCP: Marda Stalker, PA-C  Vitals:   06/03/21 1520  Weight: 182 lb (82.6 kg)     [facility] Weekly seesion - 06/03/21 1500       [facility] "PREP" Location   [facility] "PREP" Location [facility]      Weekly Session   Topic Discussed Expectations and non-scale victories   Reminder to do 6 week review, revisit, restate health and fitness goals set at start of program.   Minutes exercised this week 320 minutes    Classes attended to date [place] 06/03/2021, 3:22 PM

## 2021-06-21 ENCOUNTER — Ambulatory Visit: Payer: Medicare Other | Admitting: Hematology and Oncology

## 2021-06-27 ENCOUNTER — Encounter (HOSPITAL_COMMUNITY): Payer: Self-pay

## 2021-06-28 DIAGNOSIS — D0512 Intraductal carcinoma in situ of left breast: Secondary | ICD-10-CM | POA: Diagnosis not present

## 2021-07-01 NOTE — Progress Notes (Signed)
[facility] PREP Weekly Session  Patient Details  Name: Meghan [name] MRN: [id number] Date of Birth: [birth year] Age: 76 y.o. PCP: Marda Stalker, PA-C  Vitals:   07/01/21 1534  Weight: 178 lb 6.4 oz (80.9 kg)     [facility] Weekly seesion - 07/01/21 1500       [facility] "PREP" Location   [facility] "PREP" Location [facility]      Weekly Session   Topic Discussed Hitting roadblocks   Reviewed goals and activity plan for next 90 days after PRPE classes end; to bring to final assessment visit next week.   Classes attended to date [place] 07/01/2021, 3:35 PM

## 2021-07-08 NOTE — Progress Notes (Signed)
[facility] PREP Weekly Session ? ?Patient Details  ?Name: SUGAR VANZANDT ?MRN: [id number] ?Date of Birth: [birth year] ?Age: 76 y.o. ?PCP: Marda Stalker, PA-C ? ?Vitals:  ? 07/08/21 1256  ?Weight: 175 lb (79.4 kg)  ? ? ? [facility] Weekly seesion - 07/08/21 1200   ? ?  ? [facility] "PREP" Location  ? [facility]PREP[facility]   ?  ? Weekly Session  ? Topic Discussed Other   Fit testing completed, final assessment visit scheduled for Wednesday; to bring goals and acitivity plan and PREP survey to final visit.  ? Minutes exercised this week 50 minutes   ? Classes attended to date 44   ? ?  ?  ? ?  ? ? ?Zachery Dakins Kiyana Vazguez ?07/08/2021, 12:56 PM ? ? ?

## 2021-07-10 NOTE — Progress Notes (Signed)
[facility] PREP Evaluation ? ?Patient Details  ?Name: Meghan [name] ?MRN: [id number] ?Date of Birth: April 17, 1946 ?Age: 76 y.o. ?PCP: Marda Stalker, PA-C ? ?Vitals:  ? 07/10/21 1121  ?BP: (!) 118/54  ?Pulse: 81  ?SpO2: 98%  ?Weight: 176 lb 3.2 oz (79.9 kg)  ? ? ? [facility] Eval - 07/10/21 1100   ? ?  ? [facility] "PREP" Location  ? [facility]   ?  ? Referral   ? Program Start Date 07/10/21   Program end date  ?  ? Measurement  ? Waist Circumference 42.5 inches   ? Hip Circumference 43.5 inches   ? Body fat 44 percent   ?  ? Mobility and Daily Activities  ? I find it easy to walk up or down two or more flights of stairs. 4   ? I have no trouble taking out the trash. 4   ? I do housework such as vacuuming and dusting on my own without difficulty. 4   ? I can easily lift a gallon of milk (8lbs). 4   ? I can easily walk a mile. 3   ? I have no trouble reaching into high cupboards or reaching down to pick up something from the floor. 4   ? I do not have trouble doing out-door work such as Armed forces logistics/support/administrative officer, raking leaves, or gardening. 3   ?  ? Mobility and Daily Activities  ? I feel younger than my age. 3   ? I feel independent. 4   ? I feel energetic. 3   ? I live an active life.  2   ? I feel strong. 3   ? I feel healthy. 3   ? I feel active as other people my age. 3   ?  ? How fit and strong are you.  ? Fit and Strong Total Score 47   ? ?  ?  ? ?  ? ?Past Medical History:  ?Diagnosis Date  ? Allergy   ? Arthritis   ? Breast cancer ([place]) 2006  ? IDC+DCIS of Left breast; ER/PR+, Her2-; Ki67=17%  ? Cataract   ? REMOVED 05-2020 AND 06-2020 BILAT  ? Colon polyps   ? Hyperplastic   ? Diverticulosis of colon (without mention of hemorrhage)   ? Esophageal stricture   ? GERD (gastroesophageal reflux disease)   ? History of radiation therapy   ? History of urinary tract infection   ? Hypertension   ? CONTROLLED- OFF MEDS -  ? Lower back pain   ? Neuromuscular disorder ([place])   ? peripheral nueropathy  ? Numbness   ?  feet bilat at nighttime  ? Osteopenia   ? Personal history of radiation therapy 2006  ? Restless leg syndrome 03/27/2014  ? Tremor, 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  ? Head and neck tremor  ? Trimalleolar fracture of left ankle 08/24/2015  ? Urinary urgency   ? ?Past Surgical History:  ?Procedure Laterality Date  ? BREAST LUMPECTOMY Left 2006  ? BREAST LUMPECTOMY WITH RADIOACTIVE SEED LOCALIZATION Left 02/12/2021  ? Procedure: LEFT BREAST LUMPECTOMY WITH RADIOACTIVE SEED LOCALIZATION;  Surgeon: Erroll Luna, MD;  Location: [place];  Service: General;  Laterality: Left;  ? BREAST SURGERY    ? left 2006  ? CESAREAN SECTION    ? COLONOSCOPY    ? EYE SURGERY  2022  ? FRACTURE SURGERY  2017  ? ORIF ANKLE FRACTURE Left 08/24/2015  ? Procedure: OPEN REDUCTION INTERNAL  FIXATION (ORIF) LEFT ANKLE FRACTURE;  Surgeon: Mcarthur Rossetti, MD;  [facility];  Service: Orthopedics;  Laterality: Left;  General with a popliteal block  ? POLYPECTOMY    ? HPP 2011  ? ?Social History  ? ?Tobacco Use  ?Smoking Status Never  ?Smokeless Tobacco Never  ?Prep Class final visit: ?BP 04/15/21: 160/64 ?07/10/2021: 118/54 ?Wt loss: 18.4 lbs ?How fit and strong survey: ?04/15/21: 34 ?07/10/2021: 47 ?Education sessions completed: 9 ?Workouts completed:8 ? ?[place] ?07/10/2021, 11:23 AM ? ? ?

## 2021-08-02 DIAGNOSIS — H02831 Dermatochalasis of right upper eyelid: Secondary | ICD-10-CM | POA: Diagnosis not present

## 2021-08-02 DIAGNOSIS — Z961 Presence of intraocular lens: Secondary | ICD-10-CM | POA: Diagnosis not present

## 2021-08-02 DIAGNOSIS — H02834 Dermatochalasis of left upper eyelid: Secondary | ICD-10-CM | POA: Diagnosis not present

## 2021-08-26 ENCOUNTER — Encounter: Payer: Self-pay | Admitting: Neurology

## 2021-08-26 NOTE — Telephone Encounter (Signed)
This message has been transferred and attached to the patient's chart. See note in Epic for further information. ?

## 2021-09-10 ENCOUNTER — Encounter: Payer: Self-pay | Admitting: Cardiology

## 2021-09-11 MED ORDER — ROSUVASTATIN CALCIUM 10 MG PO TABS: 10.0000 mg | ORAL_TABLET | Freq: Every day | ORAL | 2 refills | Status: DC

## 2021-10-23 ENCOUNTER — Other Ambulatory Visit: Payer: Self-pay | Admitting: Internal Medicine

## 2021-10-28 NOTE — Progress Notes (Signed)
Patient Care Team: Marda Stalker, PA-C as PCP - General (Family Medicine) Erroll Luna, MD as Consulting Physician (General Surgery) Kyung Rudd, MD as Consulting Physician (Radiation Oncology) Kathrynn Ducking, MD (Inactive) as Consulting Physician (Neurology) Mcarthur Rossetti, MD as Consulting Physician (Orthopedic Surgery) Irene Shipper, MD as Consulting Physician (Gastroenterology) Kristeen Miss, MD as Consulting Physician (Neurosurgery) Donato Heinz, MD as Consulting Physician (Cardiology) Maisie Fus, MD as Attending Physician (Obstetrics and Gynecology) Luberta Mutter, MD as Consulting Physician (Ophthalmology) Nicholas Lose, MD as Consulting Physician (Hematology and Oncology)  DIAGNOSIS:  Encounter Diagnosis  Name Primary?   Ductal carcinoma in situ (DCIS) of left breast     SUMMARY OF ONCOLOGIC HISTORY: Oncology History  Ductal carcinoma in situ (DCIS) of left breast  10/08/2004 Surgery   s/p left lumpectomy 10/08/2004 for a pT1b pN0, stage IA invasive ductal carcinoma, grade 2, estrogen and progesterone receptor positive, HER2 not amplified, with an MIB-1 of 17%             (A) status post adjuvant MammoSite radiation             (B) status post tamoxifen for 5 years   12/22/2014 Genetic Testing   Genes tested: ATM, BARD1, BRCA1, BRCA2, BRIP1, CDH1, CHEK2, FANCC, MLH1, MSH2, MSH6, NBN, PALB2, PMS2, PTEN, RAD51C, RAD51D, TP53, and XRCC2.  .  Genetic testing was normal, and did not reveal a deleterious mutation in these genes.  Additionally, no variants of uncertain significance (VUSs) were found   01/14/2021 Initial Biopsy   Diagnosis Breast, left, needle core biopsy, upper outer, X clip - DUCTAL CARCINOMA IN SITU, INTERMEDIATE GRADE WITH CALCIFICATIONS. SEE NOTE Diagnosis Note DCIS measures 0.4 cm in greatest linear dimension  PROGNOSTIC INDICATORS Results: Estrogen Receptor: 100%, POSITIVE, STRONG STAINING INTENSITY Progesterone  Receptor: 30%, POSITIVE, STRONG STAINING INTENSITY   01/23/2021 Cancer Staging   Staging form: Breast, AJCC 8th Edition - Clinical stage from 01/23/2021: Stage 0 (cTis (DCIS), cN0, cM0, ER+, PR+, HER2: Not Assessed) - Signed by Chauncey Cruel, MD on 01/23/2021 Stage prefix: Initial diagnosis Nuclear grade: G2 Laterality: Left Staged by: Pathologist and managing physician Stage used in treatment planning: Yes National guidelines used in treatment planning: Yes Type of national guideline used in treatment planning: NCCN   02/12/2021 Cancer Staging   Staging form: Breast, AJCC 8th Edition - Pathologic stage from 02/12/2021: Stage 0 (pTis (DCIS), pN0, cM0, ER+, PR+) - Signed by Gardenia Phlegm, NP on 05/25/2021 Stage prefix: Initial diagnosis   02/12/2021 Definitive Surgery   FINAL MICROSCOPIC DIAGNOSIS:   A. BREAST, LEFT, LUMPECTOMY:  - Focus of intermediate to high-grade ductal carcinoma in situ  - Negative for invasive carcinoma  - Resection margins are negative for DCIS  - Biopsy site changes  - See oncology table   B. BREAST, LEFT ADDITIONAL SUPERIOR MARGIN, EXCISION:  - Benign breast parenchyma with no specific histopathologic changes.  - Negative for carcinoma   C. BREAST, LEFT ADDITIONAL MEDIAL MARGIN, EXCISION:  - Benign breast parenchyma with no specific histopathologic changes.  - Negative for carcinoma   D. BREAST, LEFT ADDITIONAL INFERIOR MARGIN, EXCISION:  - Benign breast parenchyma with no specific histopathologic changes.  - Negative for carcinoma   E. BREAST, LEFT ADDITIONAL LATERAL MARGIN, EXCISION:  - Benign breast parenchyma with no specific histopathologic changes.  - Negative for carcinoma   F. BREAST, LEFT ADDITIONAL POSTERIOR MARGIN, EXCISION:  - Benign breast parenchyma with no specific histopathologic changes.  - Negative for  carcinoma    COMMENT:  A.  Focus of DCIS measures 0.2 cm in greatest dimension.    04/2021 - 04/2021  Anti-estrogen oral therapy   anastrozole daily-unable to tolerate, caused dizziness     CHIEF COMPLIANT: Follow-up on left breast Dcis   INTERVAL HISTORY: Meghan [name] is a 76 y.o with the above mention left breast Dcis. She presents to the clinic today for a follow-up. She stopped taking the anastrozole she could not tolerate it. She had a lot of side effects from taking it. Night sweats. She still have some tenderness at the surgical site. She have been trying to got to the [employer] to exercise. Sometimes she is unable to make it.   ALLERGIES:  is allergic to lisinopril, amlodipine, flagyl [metronidazole], nsaids, and tetracycline hcl.  MEDICATIONS:  Current Outpatient Medications  Medication Sig Dispense Refill   Cholecalciferol (VITAMIN D3) 2000 units TABS Take 2,000 Units by mouth daily.      clidinium-chlordiazePOXIDE (LIBRAX) 5-2.5 MG capsule Take 1 capsule by mouth as needed. (Patient not taking: Reported on 05/27/2021) 30 capsule 3   Coenzyme Q10 (CO Q-10) 100 MG CAPS Take 200 mg by mouth daily.     Cranberry-Vitamin C-Probiotic (AZO CRANBERRY) 250-30 MG TABS Take 1 tablet by mouth 2 (two) times daily.      fluticasone (FLONASE) 50 MCG/ACT nasal spray Place 1 spray into both nostrils daily as needed for allergies or rhinitis.  (Patient not taking: Reported on 05/27/2021)     gabapentin (NEURONTIN) 300 MG capsule TAKE 2 CAPSULES AT BEDTIME 180 capsule 3   Glucosamine HCl (GLUCOSAMINE PO) Take by mouth 2 (two) times daily. 1500/1200 mg     Multiple Vitamin (MULTIVITAMIN) tablet Take 1 tablet by mouth every evening.      Omega-3 Fatty Acids (FISH OIL) 1200 MG CAPS Take 1,200 mg by mouth daily.      pantoprazole (PROTONIX) 40 MG tablet Take 1 tablet (40 mg total) by mouth daily. Office visit for further refills 90 tablet 0   rosuvastatin (CRESTOR) 10 MG tablet Take 1 tablet (10 mg total) by mouth daily. 90 tablet 2   TURMERIC PO Take 1 capsule by mouth 2 (two) times a day. 1000 mg      No current facility-administered medications for this visit.    PHYSICAL EXAMINATION: ECOG PERFORMANCE STATUS: 1 - Symptomatic but completely ambulatory  Vitals:   11/11/21 1037  BP: (!) 150/71  Pulse: 74  Resp: 17  Temp: 98.2 F (36.8 C)  SpO2: 97%   Filed Weights   11/11/21 1037  Weight: 172 lb 1.6 oz (78.1 kg)    BREAST: Left breast nodularity related to prior MammoSite treatment and it is also tender..   (exam performed in the presence of a chaperone)  LABORATORY DATA:  I have reviewed the data as listed    Latest Ref Rng & Units 01/23/2021    8:16 AM 12/21/2020    8:24 AM 11/09/2019    9:45 AM  CMP  Glucose 70 - 99 mg/dL 124   121   BUN 8 - 23 mg/dL 16   14   Creatinine 0.44 - 1.00 mg/dL 0.96   0.94   Sodium 135 - 145 mmol/L 141   140   Potassium 3.5 - 5.1 mmol/L 3.9   4.3   Chloride 98 - 111 mmol/L 108   105   CO2 22 - 32 mmol/L 25   19   Calcium 8.9 - 10.3 mg/dL 10.2  10.2   Total Protein 6.5 - 8.1 g/dL 7.1  6.7    Total Bilirubin 0.3 - 1.2 mg/dL 0.6     Alkaline Phos 38 - 126 U/L 60     AST 15 - 41 U/L 24     ALT 0 - 44 U/L 19       Lab Results  Component Value Date   WBC 5.2 01/23/2021   HGB 13.6 01/23/2021   HCT 41.3 01/23/2021   MCV 91.8 01/23/2021   PLT 206 01/23/2021   NEUTROABS 2.4 01/23/2021    ASSESSMENT & PLAN:  Ductal carcinoma in situ (DCIS) of left breast (1) s/p left lumpectomy 10/08/2004 for a pT1b pN0, stage IA invasive ductal carcinoma, grade 2, estrogen and progesterone receptor positive, HER2 not amplified, with an MIB-1 of 17%             (A) status post adjuvant MammoSite radiation             (B) status post tamoxifen for 5 years  (2) genetics testing 2006, updated 2018: No deleterious mutations  (3) status post left breast biopsy 01/14/2021 for ductal carcinoma in situ, grade 2, estrogen and progesterone receptor positive.  (4) status post left lumpectomy 02/12/2021 showing a 0.2 cm focus of ductal carcinoma in situ,  intermediate to high-grade, with negative margins.  (5) opted against adjuvant radiation   --------------------------------------------------------------------------------------------------------------------------------- Current treatment: anastrozole started 04/04/2021 discontinued because of toxicities  Breast cancer surveillance: 1.  Breast exam 11/11/2021: Benign 2. mammogram scheduled for 01/10/2022  Return to clinic in 1 year for follow-up    No orders of the defined types were placed in this encounter.  The patient has a good understanding of the overall plan. she agrees with it. she will call with any problems that may develop before the next visit here. Total time spent: 30 mins including face to face time and time spent for planning, charting and co-ordination of care   Harriette Ohara, MD 11/11/21    I Gardiner Coins am scribing for Dr. Lindi Adie  I have reviewed the above documentation for accuracy and completeness, and I agree with the above.

## 2021-11-11 ENCOUNTER — Inpatient Hospital Stay: Payer: Medicare Other | Attending: Hematology and Oncology | Admitting: Hematology and Oncology

## 2021-11-11 ENCOUNTER — Other Ambulatory Visit: Payer: Self-pay

## 2021-11-11 DIAGNOSIS — Z86000 Personal history of in-situ neoplasm of breast: Secondary | ICD-10-CM | POA: Insufficient documentation

## 2021-11-11 DIAGNOSIS — D0512 Intraductal carcinoma in situ of left breast: Secondary | ICD-10-CM | POA: Diagnosis not present

## 2021-11-11 NOTE — Assessment & Plan Note (Signed)
(  1) s/p left lumpectomy 10/08/2004 for a pT1b pN0, stage IA invasive ductal carcinoma, grade 2, estrogen and progesterone receptor positive, HER2 not amplified, with an MIB-1 of 17%             (A) status post adjuvant MammoSite radiation             (B) status post tamoxifen for 5 years (2) genetics testing 2006, updated 2018: No deleterious mutations (3) status post left breast biopsy 01/14/2021 for ductal carcinoma in situ, grade 2, estrogen and progesterone receptor positive. (4) status post left lumpectomy 02/12/2021 showing a 0.2 cm focus of ductal carcinoma in situ, intermediate to high-grade, with negative margins. (5) opted against adjuvant radiation  --------------------------------------------------------------------------------------------------------------------------------- Current treatment: anastrozole started 04/04/2021 discontinued because of toxicities  Breast cancer surveillance: 1.  Breast exam 11/11/2021: Benign 2. mammogram scheduled for 01/10/2022  Return to clinic in 1 year for follow-up

## 2021-11-25 ENCOUNTER — Other Ambulatory Visit: Payer: Self-pay | Admitting: Internal Medicine

## 2021-11-25 ENCOUNTER — Telehealth: Payer: Self-pay | Admitting: Internal Medicine

## 2021-11-25 MED ORDER — PANTOPRAZOLE SODIUM 40 MG PO TBEC: 40.0000 mg | DELAYED_RELEASE_TABLET | Freq: Every day | ORAL | 0 refills | Status: DC

## 2021-11-25 NOTE — Telephone Encounter (Signed)
Pantoprazole refilled. 

## 2021-11-29 ENCOUNTER — Other Ambulatory Visit: Payer: Self-pay

## 2021-11-29 ENCOUNTER — Inpatient Hospital Stay: Payer: Medicare Other | Admitting: General Practice

## 2021-11-29 NOTE — Progress Notes (Addendum)
Ad[facility] Directives Clinic  Met with Meghan [name] and her husband in [facility] to review AD forms and answer questions. Couple plans to take time to mull over details and to share wishes with family, and then to return to [facility] to finalize paperwork. Couple has chaplain's card in case additional questions arise beforehand.   [place], [place], [facility] Pager [phone] Voicemail [phone]

## 2021-11-29 NOTE — Progress Notes (Deleted)
Advance Directives Clinic   Met with Meghan [name] and her husband in [facility] to review AD forms and answer questions. Couple plans to take time to mull over details and to share wishes with family, and then to return to [facility] to finalize paperwork. Couple has chaplain's card in case additional questions arise beforehand.     [place], [place], [facility] Pager [phone] Voicemail [phone]

## 2021-12-04 ENCOUNTER — Other Ambulatory Visit: Payer: Self-pay | Admitting: Family Medicine

## 2021-12-04 DIAGNOSIS — E042 Nontoxic multinodular goiter: Secondary | ICD-10-CM | POA: Diagnosis not present

## 2021-12-04 DIAGNOSIS — N189 Chronic kidney disease, unspecified: Secondary | ICD-10-CM | POA: Diagnosis not present

## 2021-12-04 DIAGNOSIS — Z136 Encounter for screening for cardiovascular disorders: Secondary | ICD-10-CM | POA: Diagnosis not present

## 2021-12-04 DIAGNOSIS — H00015 Hordeolum externum left lower eyelid: Secondary | ICD-10-CM | POA: Diagnosis not present

## 2021-12-04 DIAGNOSIS — Z0000 Encounter for general adult medical examination without abnormal findings: Secondary | ICD-10-CM | POA: Diagnosis not present

## 2021-12-04 DIAGNOSIS — M8580 Other specified disorders of bone density and structure, unspecified site: Secondary | ICD-10-CM | POA: Diagnosis not present

## 2021-12-04 DIAGNOSIS — R7301 Impaired fasting glucose: Secondary | ICD-10-CM | POA: Diagnosis not present

## 2021-12-04 DIAGNOSIS — Z23 Encounter for immunization: Secondary | ICD-10-CM | POA: Diagnosis not present

## 2021-12-04 DIAGNOSIS — E669 Obesity, unspecified: Secondary | ICD-10-CM | POA: Diagnosis not present

## 2021-12-04 DIAGNOSIS — K219 Gastro-esophageal reflux disease without esophagitis: Secondary | ICD-10-CM | POA: Diagnosis not present

## 2021-12-04 DIAGNOSIS — C50912 Malignant neoplasm of unspecified site of left female breast: Secondary | ICD-10-CM | POA: Diagnosis not present

## 2021-12-04 DIAGNOSIS — E041 Nontoxic single thyroid nodule: Secondary | ICD-10-CM

## 2021-12-06 ENCOUNTER — Ambulatory Visit
Admission: RE | Admit: 2021-12-06 | Discharge: 2021-12-06 | Disposition: A | Discharge status: Home / Self Care | Payer: Medicare Other | Source: Ambulatory Visit | Attending: Family Medicine | Admitting: Family Medicine

## 2021-12-06 DIAGNOSIS — E042 Nontoxic multinodular goiter: Secondary | ICD-10-CM

## 2021-12-06 DIAGNOSIS — E041 Nontoxic single thyroid nodule: Secondary | ICD-10-CM | POA: Diagnosis not present

## 2021-12-23 NOTE — Progress Notes (Unsigned)
Patient: Meghan [name] Date of Birth: [birth year]  Reason for Visit: Follow up History from: Patient Primary Neurologist:    ASSESSMENT AND PLAN 76 y.o. year old female    HISTORY OF PRESENT ILLNESS: Today 12/23/21 Meghan [name] is here today for follow-up.  Has been seen for RLS, is on Mirapex and gabapentin. 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 HISTORY  12/21/20 Dr. Jannifer Franklin: Meghan [name] is a 76 year old right-handed white female with a history of restless leg syndrome dating back over a years.  The patient returns to the office today with some new issues.  She has reported over the last 2 or 3 months that she has noted some sensation of imbalance.  She notes that when she closes her eyes that she has difficulty maintaining posture.  She recently fell off of a stepladder, possibly related to this imbalance issue.  She notes that if she touches the wall or chair, this improves the sensation of imbalance.  She does report some numbness in the toes of the feet at night with some stinging sensations.  She does have low back pain without radiation down the lower extremities.  She will occasionally get injections in the back with some benefit.  She has been told that she has spondylolisthesis.  The patient denies any trouble controlling the bowels or the bladder.  She denies any numbness or sensory alteration in the hands.  She denies neck pain.  She has developed a mild tremor that is side to side involving the head and neck, no tremors of the arms are noted.  She denies any family history of diabetes.  REVIEW OF SYSTEMS: Out of a complete 14 system review of symptoms, the patient complains only of the following symptoms, and all other reviewed systems are negative.  See HPI  ALLERGIES: Allergies  Allergen Reactions   Lisinopril Swelling    SOB & swelling    Amlodipine Swelling    Swelling face   Flagyl [Metronidazole] Nausea Only   Nsaids Other (See Comments)     CKD - per PCP Other reaction(s): Other (See Comments) CKD per PCP   Tetracycline Hcl Other (See Comments)    HOME MEDICATIONS: Outpatient Medications Prior to Visit  Medication Sig Dispense Refill   Cholecalciferol (VITAMIN D3) 2000 units TABS Take 2,000 Units by mouth daily.      clidinium-chlordiazePOXIDE (LIBRAX) 5-2.5 MG capsule Take 1 capsule by mouth as needed. (Patient not taking: Reported on 05/27/2021) 30 capsule 3   Coenzyme Q10 (CO Q-10) 100 MG CAPS Take 200 mg by mouth daily.     Cranberry-Vitamin C-Probiotic (AZO CRANBERRY) 250-30 MG TABS Take 1 tablet by mouth 2 (two) times daily.      fluticasone (FLONASE) 50 MCG/ACT nasal spray Place 1 spray into both nostrils daily as needed for allergies or rhinitis.  (Patient not taking: Reported on 05/27/2021)     gabapentin (NEURONTIN) 300 MG capsule TAKE 2 CAPSULES AT BEDTIME 180 capsule 3   Glucosamine HCl (GLUCOSAMINE PO) Take by mouth 2 (two) times daily. 1500/1200 mg     Multiple Vitamin (MULTIVITAMIN) tablet Take 1 tablet by mouth every evening.      Omega-3 Fatty Acids (FISH OIL) 1200 MG CAPS Take 1,200 mg by mouth daily.      pantoprazole (PROTONIX) 40 MG tablet Take 1 tablet (40 mg total) by mouth daily. Office visit for further refills 90 tablet 0   rosuvastatin (CRESTOR) 10 MG tablet Take 1 tablet (10 mg total)  by mouth daily. 90 tablet 2   TURMERIC PO Take 1 capsule by mouth 2 (two) times a day. 1000 mg     No facility-administered medications prior to visit.    PAST MEDICAL HISTORY: Past Medical History:  Diagnosis Date   Allergy    Arthritis    Breast cancer ([place]) 2006   IDC+DCIS of Left breast; ER/PR+, Her2-; Ki67=17%   Cataract    REMOVED 05-2020 AND 06-2020 BILAT   Colon polyps    Hyperplastic    Diverticulosis of colon (without mention of hemorrhage)    Esophageal stricture    GERD (gastroesophageal reflux disease)    History of radiation therapy    History of urinary tract infection    Hypertension     CONTROLLED- OFF MEDS -   Lower back pain    Neuromuscular disorder (HCC)    peripheral nueropathy   Numbness    feet bilat at nighttime   Osteopenia    Personal history of radiation therapy 2006   Restless leg syndrome 03/27/2014   Tremor, 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   Head and neck tremor   Trimalleolar fracture of left ankle 08/24/2015   Urinary urgency     PAST SURGICAL HISTORY: Past Surgical History:  Procedure Laterality Date   BREAST LUMPECTOMY Left 2006   BREAST LUMPECTOMY WITH RADIOACTIVE SEED LOCALIZATION Left 02/12/2021   Procedure: LEFT BREAST LUMPECTOMY WITH RADIOACTIVE SEED LOCALIZATION;  Surgeon: Erroll Luna, MD;  Location: [facility];  Service: General;  Laterality: Left;   BREAST SURGERY     left 2006   CESAREAN SECTION     COLONOSCOPY     EYE SURGERY  2022   FRACTURE SURGERY  2017   ORIF ANKLE FRACTURE Left 08/24/2015   Procedure: OPEN REDUCTION INTERNAL FIXATION (ORIF) LEFT ANKLE FRACTURE;  Surgeon: Mcarthur Rossetti, MD;  Location: [facility];  Service: Orthopedics;  Laterality: Left;  General with a popliteal block   POLYPECTOMY     HPP 2011    FAMILY HISTORY: Family History  Problem Relation Age of Onset   Heart attack Mother    Varicose Veins Mother    COPD Father    Heart disease Father    Emphysema Father        smoker   Breast cancer Sister 63       mastectomy; metastasis to liver and lung   Cancer Sister    Obesity Brother    Breast cancer Maternal Aunt        dx. late 65s   Cancer Maternal Aunt    Alzheimer's disease Maternal Aunt    Breast cancer Maternal Aunt        dx. 70s   Squamous cell carcinoma Maternal Aunt        recently removed; dx. 95   Cancer Maternal Aunt    Heart disease Maternal Grandmother    Congestive Heart Failure Maternal Grandmother    Heart disease Paternal Grandmother    Breast cancer Cousin 62       genetic testing in approx 2015   Ovarian cancer Cousin        dx. 12s   Esophageal cancer  Cousin        not a smoker   Neuropathy Neg Hx    Thyroid disease Neg Hx    Colon cancer Neg Hx    Colon polyps Neg Hx    Rectal cancer Neg Hx    Stomach cancer Neg Hx     SOCIAL HISTORY:  Social History   Socioeconomic History   Marital status: Married    Spouse name: Not on file   Number of children: 2   Years of education: AS   Highest education level: Not on file  Occupational History   Occupation: Programmer, multimedia: [employer]   Occupation: Retired  Tobacco Use   Smoking status: Never   Smokeless tobacco: Never  Scientific laboratory technician Use: Never used  Substance and Sexual Activity   Alcohol use: Yes    Comment: rare occ drink wine   Drug use: No   Sexual activity: Yes  Other Topics Concern   Not on file  Social History Narrative   Lives at home w/ her husband   Right-handed   Occasionally drinks tea   Social Determinants of Health   Financial Resource Strain: Low Risk  (05/27/2021)   Overall Financial Resource Strain (CARDIA)    Difficulty of Paying Living Expenses: Not very hard  Food Insecurity: No Food Insecurity (05/27/2021)   Hunger Vital Sign    Worried About Running Out of Food in the Last Year: Never true    [place] in the Last Year: Never true  Transportation Needs: No Transportation Needs (05/27/2021)   PRAPARE - Hydrologist (Medical): No    Lack of Transportation (Non-Medical): No  Physical Activity: Sufficiently Active (05/27/2021)   Exercise Vital Sign    Days of Exercise per Week: 3 days    Minutes of Exercise per Session: 90 min  Stress: Not on file  Social Connections: Socially Integrated (05/27/2021)   Social Connection and Isolation Panel [NHANES]    Frequency of Communication with Friends and Family: More than three times a week    Frequency of Social Gatherings with Friends and Family: Three times a week    Attends Religious Services: More than 4 times per year    Active Member of Clubs or  Organizations: Yes    Attends Archivist Meetings: 1 to 4 times per year    Marital Status: Married  Human resources officer Violence: Not At Risk (05/27/2021)   Humiliation, Afraid, Rape, and Kick questionnaire    Fear of Current or Ex-Partner: No    Emotionally Abused: No    Physically Abused: No    Sexually Abused: No    PHYSICAL EXAM  There were no vitals filed for this visit. There is no height or weight on file to calculate BMI.  Generalized: Well developed, in no acute distress  Neurological examination  Mentation: Alert oriented to time, place, history taking. Follows all commands speech and language fluent Cranial nerve II-XII: Pupils were equal round reactive to light. Extraocular movements were full, visual field were full on confrontational test. Facial sensation and strength were normal. Uvula tongue midline. Head turning and shoulder shrug  were normal and symmetric. Motor: The motor testing reveals 5 over 5 strength of all 4 extremities. Good symmetric motor tone is noted throughout.  Sensory: Sensory testing is intact to soft touch on all 4 extremities. No evidence of extinction is noted.  Coordination: Cerebellar testing reveals good finger-nose-finger and heel-to-shin bilaterally.  Gait and station: Gait is normal. Tandem gait is normal. Romberg is negative. No drift is seen.  Reflexes: Deep tendon reflexes are symmetric and normal bilaterally.   DIAGNOSTIC DATA (LABS, IMAGING, TESTING) - I reviewed patient records, labs, notes, testing and imaging myself where available.  Lab Results  Component Value Date  WBC 5.2 01/23/2021   HGB 13.6 01/23/2021   HCT 41.3 01/23/2021   MCV 91.8 01/23/2021   PLT 206 01/23/2021      Component Value Date/Time   NA 141 01/23/2021 0816   NA 140 11/09/2019 0945   K 3.9 01/23/2021 0816   CL 108 01/23/2021 0816   CO2 25 01/23/2021 0816   GLUCOSE 124 (H) 01/23/2021 0816   BUN 16 01/23/2021 0816   BUN 14 11/09/2019 0945    CREATININE 0.96 01/23/2021 0816   CALCIUM 10.2 01/23/2021 0816   PROT 7.1 01/23/2021 0816   PROT 6.7 12/21/2020 0824   ALBUMIN 3.6 01/23/2021 0816   AST 24 01/23/2021 0816   ALT 19 01/23/2021 0816   ALKPHOS 60 01/23/2021 0816   BILITOT 0.6 01/23/2021 0816   GFRNONAA >60 01/23/2021 0816   GFRAA 69 11/09/2019 0945   Lab Results  Component Value Date   CHOL 119 01/13/2020   HDL 49 01/13/2020   LDLCALC 49 01/13/2020   TRIG 117 01/13/2020   CHOLHDL 2.4 01/13/2020   No results found for: "HGBA1C" Lab Results  Component Value Date   [id number] 256 12/21/2020   No results found for: "TSH"  Butler Denmark, AGNP-C, DNP 12/23/2021, 9:42 PM [facility] [address], [place], [place] [address] [phone]

## 2021-12-24 ENCOUNTER — Encounter: Payer: Self-pay | Admitting: Neurology

## 2021-12-24 ENCOUNTER — Ambulatory Visit (INDEPENDENT_AMBULATORY_CARE_PROVIDER_SITE_OTHER): Payer: Medicare Other | Admitting: Neurology

## 2021-12-24 VITALS — BP 147/88 | HR 70 | Ht 64.0 in | Wt 169.0 lb

## 2021-12-24 DIAGNOSIS — G2581 Restless legs syndrome: Secondary | ICD-10-CM | POA: Diagnosis not present

## 2021-12-24 DIAGNOSIS — G629 Polyneuropathy, unspecified: Secondary | ICD-10-CM | POA: Insufficient documentation

## 2021-12-24 MED ORDER — PREGABALIN 50 MG PO CAPS: ORAL_CAPSULE | ORAL | 3 refills | Status: DC

## 2021-12-24 NOTE — Patient Instructions (Signed)
Stop the gabapentin, try Lyrica for RLS, start taking 1 tablet at bedtime, can take a 2nd if needed

## 2021-12-25 ENCOUNTER — Encounter: Payer: Self-pay | Admitting: Neurology

## 2021-12-25 ENCOUNTER — Telehealth: Payer: Self-pay | Admitting: Neurology

## 2021-12-25 LAB — CBC WITH DIFFERENTIAL/PLATELET
Basophils Absolute: 0 10*3/uL (ref 0.0–0.2)
Basos: 1 %
EOS (ABSOLUTE): 0.2 10*3/uL (ref 0.0–0.4)
Eos: 4 %
Hematocrit: 41.8 % (ref 34.0–46.6)
Hemoglobin: 14 g/dL (ref 11.1–15.9)
Immature Grans (Abs): 0 10*3/uL (ref 0.0–0.1)
Immature Granulocytes: 1 %
Lymphocytes Absolute: 1.6 10*3/uL (ref 0.7–3.1)
Lymphs: 37 %
MCH: 31.5 pg (ref 26.6–33.0)
MCHC: 33.5 g/dL (ref 31.5–35.7)
MCV: 94 fL (ref 79–97)
Monocytes Absolute: 0.5 10*3/uL (ref 0.1–0.9)
Monocytes: 12 %
Neutrophils Absolute: 2 10*3/uL (ref 1.4–7.0)
Neutrophils: 45 %
Platelets: 197 10*3/uL (ref 150–450)
RBC: 4.44 x10E6/uL (ref 3.77–5.28)
RDW: 12.3 % (ref 11.7–15.4)
WBC: 4.3 10*3/uL (ref 3.4–10.8)

## 2021-12-25 LAB — IRON,TIBC AND FERRITIN PANEL
Ferritin: 43 ng/mL (ref 15–150)
Iron Saturation: 27 % (ref 15–55)
Iron: 77 ug/dL (ref 27–139)
Total Iron Binding Capacity: 290 ug/dL (ref 250–450)
UIBC: 213 ug/dL (ref 118–369)

## 2021-12-25 NOTE — Telephone Encounter (Signed)
I called the patient, she was getting ready to go to work and could not talk.  I will send her a MyChart message.  Ferritin level is in the low normal range at 43.  Since RLS symptoms are bothersome, and ferritin levels less than 75, she may do a trial of oral iron to see if any benefit.

## 2022-01-08 ENCOUNTER — Encounter: Payer: Self-pay | Admitting: Neurology

## 2022-01-10 ENCOUNTER — Ambulatory Visit
Admission: RE | Admit: 2022-01-10 | Discharge: 2022-01-10 | Disposition: A | Discharge status: Home / Self Care | Payer: Medicare Other | Source: Ambulatory Visit | Attending: Adult Health | Admitting: Adult Health

## 2022-01-10 DIAGNOSIS — R928 Other abnormal and inconclusive findings on diagnostic imaging of breast: Secondary | ICD-10-CM | POA: Diagnosis not present

## 2022-01-10 DIAGNOSIS — D0512 Intraductal carcinoma in situ of left breast: Secondary | ICD-10-CM

## 2022-01-10 DIAGNOSIS — Z853 Personal history of malignant neoplasm of breast: Secondary | ICD-10-CM | POA: Diagnosis not present

## 2022-01-16 DIAGNOSIS — N958 Other specified menopausal and perimenopausal disorders: Secondary | ICD-10-CM | POA: Diagnosis not present

## 2022-01-16 DIAGNOSIS — K219 Gastro-esophageal reflux disease without esophagitis: Secondary | ICD-10-CM | POA: Diagnosis not present

## 2022-01-16 DIAGNOSIS — M8580 Other specified disorders of bone density and structure, unspecified site: Secondary | ICD-10-CM | POA: Diagnosis not present

## 2022-01-16 DIAGNOSIS — Z01419 Encounter for gynecological examination (general) (routine) without abnormal findings: Secondary | ICD-10-CM | POA: Diagnosis not present

## 2022-01-16 DIAGNOSIS — Z6830 Body mass index (BMI) 30.0-30.9, adult: Secondary | ICD-10-CM | POA: Diagnosis not present

## 2022-01-16 DIAGNOSIS — Z1151 Encounter for screening for human papillomavirus (HPV): Secondary | ICD-10-CM | POA: Diagnosis not present

## 2022-01-16 DIAGNOSIS — Z124 Encounter for screening for malignant neoplasm of cervix: Secondary | ICD-10-CM | POA: Diagnosis not present

## 2022-01-23 ENCOUNTER — Encounter: Payer: Self-pay | Admitting: Neurology

## 2022-01-23 MED ORDER — PREGABALIN 50 MG PO CAPS: ORAL_CAPSULE | ORAL | 1 refills | Status: DC

## 2022-01-23 NOTE — Telephone Encounter (Signed)
Faxed to local [facility] to cancel remaining refills.  Received fax confirmation [phone].

## 2022-01-23 NOTE — Telephone Encounter (Signed)
If you chose to do her local pharmacy needs to cancel last refills.

## 2022-01-27 ENCOUNTER — Ambulatory Visit (INDEPENDENT_AMBULATORY_CARE_PROVIDER_SITE_OTHER): Payer: Medicare Other | Admitting: Internal Medicine

## 2022-01-27 ENCOUNTER — Encounter: Payer: Self-pay | Admitting: Internal Medicine

## 2022-01-27 VITALS — BP 124/78 | HR 77 | Ht 64.0 in | Wt 170.0 lb

## 2022-01-27 DIAGNOSIS — K219 Gastro-esophageal reflux disease without esophagitis: Secondary | ICD-10-CM | POA: Diagnosis not present

## 2022-01-27 DIAGNOSIS — K589 Irritable bowel syndrome without diarrhea: Secondary | ICD-10-CM | POA: Diagnosis not present

## 2022-01-27 MED ORDER — CILIDINIUM-CHLORDIAZEPOXIDE 2.5-5 MG PO CAPS: 1.0000 | ORAL_CAPSULE | ORAL | 3 refills | Status: DC | PRN

## 2022-01-27 MED ORDER — PANTOPRAZOLE SODIUM 40 MG PO TBEC: 40.0000 mg | DELAYED_RELEASE_TABLET | Freq: Every day | ORAL | 3 refills | Status: DC

## 2022-01-27 NOTE — Patient Instructions (Signed)
_______________________________________________________  If you are age 76 or older, your body mass index should be between 23-30. Your Body mass index is 29.18 kg/m. If this is out of the aforementioned range listed, please consider follow up with your Primary Care Provider.  If you are age 36 or younger, your body mass index should be between 19-25. Your Body mass index is 29.18 kg/m. If this is out of the aformentioned range listed, please consider follow up with your Primary Care Provider.   ________________________________________________________  The [facility] providers would like to encourage you to use [facility] to communicate with providers for non-urgent requests or questions.  Due to long hold times on the telephone, sending your provider a message by [facility] may be a faster and more efficient way to get a response.  Please allow 48 business hours for a response.  Please remember that this is for non-urgent requests.  _______________________________________________________  We have sent the following medications to your pharmacy for you to pick up at your convenience:  Pantoprazole, Librax  Please follow up in 2 years

## 2022-01-27 NOTE — Progress Notes (Signed)
HISTORY OF PRESENT ILLNESS:  Meghan [name] is a 76 y.o. female, retired cardiology nurse, who presents today regarding management of chronic GERD and occasional IBS.  I last saw the patient in the office November 09, 2019.  See that dictation.  She last underwent colonoscopy November 14, 2020.  She was found to have diverticular disease.  Otherwise normal exam.  No routine follow-up recommended.  She continues on pantoprazole 40 mg daily for her GERD.  Except for rare, unexplained, nocturnal reflux managed with Tums, she is doing well.  No dysphagia.  No appreciable medication side effects.  She did have successful surgery for breast cancer.  No lower GI complaints.  She does occasionally need Librax for IBS type symptoms.  She requests a refill.  Blood work from August 2023 shows normal CBC with hemoglobin 14.0.  REVIEW OF SYSTEMS:  All non-GI ROS negative unless otherwise stated in the HPI.  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%   Cataract    REMOVED 05-2020 AND 06-2020 BILAT   Colon polyps    Hyperplastic    Diverticulosis of colon (without mention of hemorrhage)    Esophageal stricture    GERD (gastroesophageal reflux disease)    History of radiation therapy    History of urinary tract infection    Hypertension    CONTROLLED- OFF MEDS -   Lower back pain    Neuromuscular disorder (HCC)    peripheral nueropathy   Numbness    feet bilat at nighttime   Osteopenia    Personal history of radiation therapy 2006   Restless leg syndrome 03/27/2014   Tremor, 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   Head and neck tremor   Trimalleolar fracture of left ankle 08/24/2015   Urinary urgency     Past Surgical History:  Procedure Laterality Date   BREAST LUMPECTOMY Left 2006   BREAST LUMPECTOMY WITH RADIOACTIVE SEED LOCALIZATION Left 02/12/2021   Procedure: LEFT BREAST LUMPECTOMY WITH RADIOACTIVE SEED  LOCALIZATION;  Surgeon: Erroll Luna, MD;  Location: [facility];  Service: General;  Laterality: Left;   BREAST SURGERY     left 2006   CESAREAN SECTION     COLONOSCOPY     EYE SURGERY  2022   FRACTURE SURGERY  2017   ORIF ANKLE FRACTURE Left 08/24/2015   Procedure: OPEN REDUCTION INTERNAL FIXATION (ORIF) LEFT ANKLE FRACTURE;  Surgeon: Mcarthur Rossetti, MD;  Location: [facility];  Service: Orthopedics;  Laterality: Left;  General with a popliteal block   POLYPECTOMY     HPP 2011    Social History [name]  reports that she has never smoked. She has never used smokeless tobacco. She reports current alcohol use. She reports that she does not use drugs.  family history includes Alzheimer's disease in her maternal aunt; Breast cancer in her maternal aunt and maternal aunt; Breast cancer (age of onset: 69) in her sister; Breast cancer (age of onset: 23) in her cousin; COPD in her father; Cancer in her maternal aunt, maternal aunt, and sister; Congestive Heart Failure in her maternal grandmother; Emphysema in her father; Esophageal cancer in her cousin; Heart attack in her mother; Heart disease in her father, maternal grandmother, and paternal grandmother; Obesity in her brother; Ovarian cancer in her cousin; Squamous cell carcinoma in her maternal aunt; Varicose Veins in her mother.  Allergies  Allergen Reactions  Lisinopril Swelling    SOB & swelling    Amlodipine Swelling    Swelling face   Anastrozole Hives and Other (See Comments)   Flagyl [Metronidazole] Nausea Only   Nsaids Other (See Comments)    CKD - per PCP Other reaction(s): Other (See Comments) CKD per PCP   Tetracycline Hcl Other (See Comments)       PHYSICAL EXAMINATION: Vital signs: BP 124/78   Pulse 77   Ht 5' 4" (1.626 m)   Wt 170 lb (77.1 kg)   BMI 29.18 kg/m   Constitutional: generally well-appearing, no acute distress Psychiatric: alert and oriented x3, cooperative Eyes:  extraocular movements intact, anicteric, conjunctiva pink Mouth: oral pharynx moist, no lesions Neck: supple no lymphadenopathy Cardiovascular: heart regular rate and rhythm, no murmur Lungs: clear to auscultation bilaterally Abdomen: soft, nontender, nondistended, no obvious ascites, no peritoneal signs, normal bowel sounds, no organomegaly Rectal: Omitted Extremities: no clubbing, cyanosis, or lower extremity edema bilaterally Skin: no lesions on visible extremities Neuro: No focal deficits.  Cranial nerves intact  ASSESSMENT:  1.  GERD.  Symptoms controlled with once daily PPI 2.  Colonoscopy 2011 and 2022 negative for neoplasia.  Aged out of screening 3.  IBS.  Infrequent and managed with on-demand Librax 4.  General medical problems.  Stable   PLAN:  1.  Reflux precautions. 2.  Refill pantoprazole 40 mg daily.  Medication risks reviewed 3.  Refill Librax.  Occasional risks reviewed 4.  Routine GI follow-up 2 years.  Contact the office in the interim for questions or problems

## 2022-03-22 ENCOUNTER — Other Ambulatory Visit: Payer: Self-pay | Admitting: Internal Medicine

## 2022-04-13 NOTE — Progress Notes (Unsigned)
Cardiology Office Note:    Date:  04/15/2022   ID:  Meghan [name], DOB [birth year], MRN [id number]  PCP:  Marda Stalker, PA-C  Cardiologist:  None  Electrophysiologist:  None   Referring MD: Marda Stalker, PA-C   Chief Complaint  Patient presents with   Follow-up     History of Present Illness:    Meghan [name] is a 76 y.o. female with a hx of breast cancer, esophageal stricture who presents for follow-up. She was referred by Dr. Rex Kras for evaluation of chest pain, initially seen on 10/13/2019. She reports that she has been having intermittent chest pain.  Reports that it occurs every day, can last up to 30 minutes.  Can occur with exertion, but she states she has not been exercising due to back pain.  States that over the last 2 weeks she has tried to walk on the treadmill 1-2 times, but does walk for less than 10 minutes.  Felt short of breath when she tried to do this.  States that she feels her shortness of breath has worsened over the last 6 weeks.  Walking up hills causes her to be short of breath.  Reports BP has been elevated up to 150s at home.  No smoking history.  Family history includes father had MI in 18s.  Lexiscan Myoview on 10/21/2019 showed normal perfusion, EF 68%. Echocardiogram on 11/11/2019 showed LVEF 65 to 70%, moderate LVH, grade 1 diastolic dysfunction, normal RV function, mild AI. Calcium score on 11/11/2019 was 57 (54th percentile); also notable for small pulmonary nodules.  Since last clinic visit, she reports she is doing well.  Has lost 30 pounds since last year.  Denies any chest pain, dyspnea, lightheadedness, syncope, lower extremity edema, or palpitations.  Did exercise program at [facility], was going twice per week but not recently.    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%   Cataract    REMOVED 05-2020 AND 06-2020 BILAT   Colon polyps    Hyperplastic    Diverticulosis of colon (without mention of hemorrhage)    Esophageal stricture    GERD (gastroesophageal reflux disease)    History of radiation therapy    History of urinary tract infection    Hypertension    CONTROLLED- OFF MEDS -   Lower back pain    Neuromuscular disorder (HCC)    peripheral nueropathy   Numbness    feet bilat at nighttime   Osteopenia    Personal history of radiation therapy 2006   Restless leg syndrome 03/27/2014   Tremor, 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   Head and neck tremor   Trimalleolar fracture of left ankle 08/24/2015   Urinary urgency     Past Surgical History:  Procedure Laterality Date   BREAST LUMPECTOMY Left 2006   BREAST LUMPECTOMY WITH RADIOACTIVE SEED LOCALIZATION Left 02/12/2021   Procedure: LEFT BREAST LUMPECTOMY WITH RADIOACTIVE SEED LOCALIZATION;  Surgeon: Erroll Luna, MD;  Location: [facility];  Service: General;  Laterality: Left;   BREAST SURGERY     left 2006   CESAREAN SECTION     COLONOSCOPY     EYE SURGERY  2022   FRACTURE SURGERY  2017  ORIF ANKLE FRACTURE Left 08/24/2015   Procedure: OPEN REDUCTION INTERNAL FIXATION (ORIF) LEFT ANKLE FRACTURE;  Surgeon: Mcarthur Rossetti, MD;  Location: [facility];  Service: Orthopedics;  Laterality: Left;  General with a popliteal block   POLYPECTOMY     HPP 2011    Current Medications: Current Meds  Medication Sig   b complex vitamins capsule Take 1 capsule by mouth daily.   Cholecalciferol (VITAMIN D3) 2000 units TABS Take 2,000 Units by mouth daily.    clidinium-chlordiazePOXIDE (LIBRAX) 5-2.5 MG capsule Take 1 capsule by mouth as needed.   Coenzyme Q10 (CO Q-10) 100 MG CAPS Take 200 mg by mouth daily.   Cranberry-Vitamin C-Probiotic (AZO CRANBERRY) 250-30 MG TABS Take 1 tablet by mouth 2 (two) times daily.    fluticasone  (FLONASE) 50 MCG/ACT nasal spray Place 1 spray into both nostrils daily as needed for allergies or rhinitis.   Glucosamine HCl (GLUCOSAMINE PO) Take by mouth 2 (two) times daily. 1500/1200 mg   MAGNESIUM PO Take 240 mg by mouth daily.   Multiple Vitamin (MULTIVITAMIN) tablet Take 1 tablet by mouth every evening.    NON FORMULARY Citrical plus d 2 a day   Omega-3 Fatty Acids (FISH OIL) 1200 MG CAPS Take 1,200 mg by mouth daily.    pantoprazole (PROTONIX) 40 MG tablet Take 1 tablet (40 mg total) by mouth daily.   pregabalin (LYRICA) 50 MG capsule Take up to 2 tablets at bedtime for restless leg symptoms   rosuvastatin (CRESTOR) 10 MG tablet Take 1 tablet (10 mg total) by mouth daily.   TURMERIC PO Take 1 capsule by mouth 2 (two) times a day. 1000 mg     Allergies:   Lisinopril, Amlodipine, Anastrozole, Flagyl [metronidazole], Nsaids, and Tetracycline hcl   Social History   Socioeconomic History   Marital status: Married    Spouse name: Not on file   Number of children: 2   Years of education: AS   Highest education level: Not on file  Occupational History   Occupation: Programmer, multimedia: [employer]   Occupation: Retired  Tobacco Use   Smoking status: Never   Smokeless tobacco: Never  Scientific laboratory technician Use: Never used  Substance and Sexual Activity   Alcohol use: Yes    Comment: rare occ drink wine   Drug use: No   Sexual activity: Yes  Other Topics Concern   Not on file  Social History Narrative   Lives at home w/ her husband   Right-handed   Occasionally drinks tea   Social Determinants of Health   Financial Resource Strain: Low Risk  (05/27/2021)   Overall Financial Resource Strain (CARDIA)    Difficulty of Paying Living Expenses: Not very hard  Food Insecurity: No Food Insecurity (05/27/2021)   Hunger Vital Sign    Worried About Running Out of Food in the Last Year: Never true    [place] in the Last Year: Never true  Transportation Needs: No  Transportation Needs (05/27/2021)   PRAPARE - Hydrologist (Medical): No    Lack of Transportation (Non-Medical): No  Physical Activity: Sufficiently Active (05/27/2021)   Exercise Vital Sign    Days of Exercise per Week: 3 days    Minutes of Exercise per Session: 90 min  Stress: Not on file  Social Connections: Socially Integrated (05/27/2021)   Social Connection and Isolation Panel [NHANES]    Frequency of Communication with Friends and  Family: More than three times a week    Frequency of Social Gatherings with Friends and Family: Three times a week    Attends Religious Services: More than 4 times per year    Active Member of Clubs or Organizations: Yes    Attends Archivist Meetings: 1 to 4 times per year    Marital Status: Married     Family History:  family history includes Alzheimer's disease in her maternal aunt; Breast cancer in her maternal aunt and maternal aunt; Breast cancer (age of onset: 62) in her sister; Breast cancer (age of onset: 30) in her cousin; COPD in her father; Cancer in her maternal aunt, maternal aunt, and sister; Congestive Heart Failure in her maternal grandmother; Emphysema in her father; Esophageal cancer in her cousin; Heart attack in her mother; Heart disease in her father, maternal grandmother, and paternal grandmother; Obesity in her brother; Ovarian cancer in her cousin; Squamous cell carcinoma in her maternal aunt; Varicose Veins in her mother. There is no history of Neuropathy, Thyroid disease, Colon cancer, Colon polyps, Rectal cancer, or Stomach cancer.  ROS:   Please see the history of present illness.    All other systems reviewed and are negative.  EKGs/Labs/Other Studies Reviewed:    The following studies were reviewed today:   EKG:   03/18/21: Normal sinus rhythm, rate 78, low voltage, poor R wave progression 04/15/22: Normal sinus rhythm, rate 77, poor R wave progression  Recent Labs: 12/24/2021:  Hemoglobin 14.0; Platelets 197  Recent Lipid Panel    Component Value Date/Time   CHOL 119 01/13/2020 0812   TRIG 117 01/13/2020 0812   HDL 49 01/13/2020 0812   CHOLHDL 2.4 01/13/2020 0812   LDLCALC 49 01/13/2020 0812    Physical Exam:    VS:  BP 122/66   Pulse 77   Ht _0  (1.6 m)   Wt 169 lb 9.6 oz (76.9 kg)   SpO2 98%   BMI 30.04 kg/m     Wt Readings from Last 3 Encounters:  04/15/22 169 lb 9.6 oz (76.9 kg)  01/27/22 170 lb (77.1 kg)  12/24/21 169 lb (76.7 kg)     GEN: in no acute distress HEENT: Normal NECK: No JVD; No carotid bruits CARDIAC: RRR, no murmurs, rubs, gallops RESPIRATORY:  Clear to auscultation without rales, wheezing or rhonchi  ABDOMEN: Soft, non-tender, non-distended MUSCULOSKELETAL:  No edema; No deformity  SKIN: Warm and dry NEUROLOGIC:  Alert and oriented x 3 PSYCHIATRIC:  Normal affect   ASSESSMENT:    1. Chest pain of uncertain etiology   2. Essential hypertension   3. Hyperlipidemia, unspecified hyperlipidemia type   4. Pulmonary nodules      PLAN:    Chest pain/dyspnea on exertion: Chest pain is atypical in description.  Lexiscan Myoview on 10/21/2019 showed normal perfusion, EF 68%. Echocardiogram on 11/11/2019 showed LVEF 65 to 70%, moderate LVH, grade 1 diastolic dysfunction, normal RV function, mild AI. Calcium score on 11/11/2019 was 57 (54th percentile).  Symptoms have improved, no further cardiac work-up recommended.  Hypertension: Previously on lisinopril and amlodipine, but did not tolerate.  Currently off antihypertensives, and BP appears reasonably controlled.  Continue to monitor  Hyperlipidemia: LDL 61 on 12/04/21, continue rosuvastatin 10 mg daily.    Pulmonary nodules: CT calcium score 11/2019 showed small pulmonary nodules. Likely benign but given her breast cancer history, repeat chest CT was done on 04/05/2021 which showed unchanged small pulmonary nodules  RTC in 1 year  Medication Adjustments/Labs and Tests  Ordered: Current medicines are reviewed at length with the patient today.  Concerns regarding medicines are outlined above.  No orders of the defined types were placed in this encounter.   No orders of the defined types were placed in this encounter.    Patient Instructions  Medication Instructions:  Your physician recommends that you continue on your current medications as directed. Please refer to the Current Medication list given to you today.  *If you need a refill on your cardiac medications before your next appointment, please call your pharmacy*  Follow-Up: At [facility], you and your health needs are our priority.  As part of our continuing mission to provide you with exceptional heart care, we have created designated Provider Care Teams.  These Care Teams include your primary Cardiologist (physician) and Advanced Practice Providers (APPs -  Physician Assistants and Nurse Practitioners) who all work together to provide you with the care you need, when you need it.  We recommend signing up for the patient portal called "MyChart".  Sign up information is provided on this After Visit Summary.  MyChart is used to connect with patients for Virtual Visits (Telemedicine).  Patients are able to view lab/test results, encounter notes, upcoming appointments, etc.  Non-urgent messages can be sent to your provider as well.   To learn more about what you can do with MyChart, go to [email or web address].    Your next appointment:   12 month(s)  The format for your next appointment:   In Person  Provider:   Dr. Gardiner Rhyme          Signed, Donato Heinz, MD  04/15/2022 5:28 PM    [place]

## 2022-04-15 ENCOUNTER — Ambulatory Visit: Payer: Medicare Other | Attending: Cardiology | Admitting: Cardiology

## 2022-04-15 ENCOUNTER — Encounter: Payer: Self-pay | Admitting: Cardiology

## 2022-04-15 VITALS — BP 122/66 | HR 77 | Ht 63.0 in | Wt 169.6 lb

## 2022-04-15 DIAGNOSIS — R918 Other nonspecific abnormal finding of lung field: Secondary | ICD-10-CM | POA: Insufficient documentation

## 2022-04-15 DIAGNOSIS — E785 Hyperlipidemia, unspecified: Secondary | ICD-10-CM | POA: Diagnosis not present

## 2022-04-15 DIAGNOSIS — R079 Chest pain, unspecified: Secondary | ICD-10-CM | POA: Insufficient documentation

## 2022-04-15 DIAGNOSIS — I10 Essential (primary) hypertension: Secondary | ICD-10-CM | POA: Diagnosis not present

## 2022-04-15 MED ORDER — ROSUVASTATIN CALCIUM 10 MG PO TABS: 10.0000 mg | ORAL_TABLET | Freq: Every day | ORAL | 2 refills | Status: DC

## 2022-04-15 NOTE — Patient Instructions (Signed)
Medication Instructions:  Your physician recommends that you continue on your current medications as directed. Please refer to the Current Medication list given to you today.  *If you need a refill on your cardiac medications before your next appointment, please call your pharmacy*  Follow-Up: At [facility], you and your health needs are our priority.  As part of our continuing mission to provide you with exceptional heart care, we have created designated Provider Care Teams.  These Care Teams include your primary Cardiologist (physician) and Advanced Practice Providers (APPs -  Physician Assistants and Nurse Practitioners) who all work together to provide you with the care you need, when you need it.  We recommend signing up for the patient portal called "MyChart".  Sign up information is provided on this After Visit Summary.  MyChart is used to connect with patients for Virtual Visits (Telemedicine).  Patients are able to view lab/test results, encounter notes, upcoming appointments, etc.  Non-urgent messages can be sent to your provider as well.   To learn more about what you can do with MyChart, go to [email or web address].    Your next appointment:   12 month(s)  The format for your next appointment:   In Person  Provider:   Dr. Schumann       

## 2022-06-30 DIAGNOSIS — D0512 Intraductal carcinoma in situ of left breast: Secondary | ICD-10-CM | POA: Diagnosis not present

## 2022-07-01 NOTE — Progress Notes (Unsigned)
Patient: Meghan [name] Date of Birth: 03-08-1946  Reason for Visit: Follow up for RLS History from: Patient Primary Neurologist: [facility]   ASSESSMENT AND PLAN 77 y.o. year old female   67.  Restless leg syndrome 2.  Polyneuropathy  -Meghan [name] is a lovely patient, has had good benefit with Lyrica, will continue 50 mg, 2 tablets at bedtime for RLS symptoms, if she continues to have to take a third she will let me know and I will update the prescription (Filled 04/27/22 # 90 days, due in mid March, she will reach out) -Seeing PCP next month, would recommend repeat iron panel -Ferritin level was 43 August 2023 (if < 75, may exacerbate RLS sx, try oral iron) -Tried and failed: Mirapex, gabapentin -Follow-up in 6 months or sooner if needed  No orders of the defined types were placed in this encounter.  HISTORY OF PRESENT ILLNESS: Today 07/02/22 Takes Lyrica 50 mg, 2 capsules at bedtime, 1-2 nights a week RLS symptoms are still bothersome, may take Extra Strength Tylenol or has occasionally taken extra Lyrica. Likely RLS  symptoms related to her mind racing, stress at work. Sometimes in the morning feels off balance first thing. Likes Lyrica better than gabapentin. She did take some Iron but it gave her diarrhea, it did help her RLS.  Started multivitamin with some iron.  Update 12/24/21 SS: [name] is here today for follow-up.  Has been seen for RLS, is on Mirapex and gabapentin. 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 Husband has poor memory. She works part time [employer]. She stopped the Mirapex last October due to feeling groggy in the AM, off balance. RLS symptoms have worsened, most nights has to take Tylenol to get to sleep. Still unsteady at times, often related to chronic ankle injury to left. No falls. RLS symptoms bother her 5/7 nights a week. Doesn't bother her during the day. Recurrent breast cancer last October had lumpectomy. Not on any chemo  agents, mammogram next month.  On B12 supplement.  Intermittent head and neck tremor exacerbated by anxiety.  Also on magnesium.  HISTORY  12/21/20 Dr. Jannifer Franklin: Meghan [name] is a 77 year old right-handed white female with a history of restless leg syndrome dating back over a years.  The patient returns to the office today with some new issues.  She has reported over the last 2 or 3 months that she has noted some sensation of imbalance.  She notes that when she closes her eyes that she has difficulty maintaining posture.  She recently fell off of a stepladder, possibly related to this imbalance issue.  She notes that if she touches the wall or chair, this improves the sensation of imbalance.  She does report some numbness in the toes of the feet at night with some stinging sensations.  She does have low back pain without radiation down the lower extremities.  She will occasionally get injections in the back with some benefit.  She has been told that she has spondylolisthesis.  The patient denies any trouble controlling the bowels or the bladder.  She denies any numbness or sensory alteration in the hands.  She denies neck pain.  She has developed a mild tremor that is side to side involving the head and neck, no tremors of the arms are noted.  She denies any family history of diabetes.  REVIEW OF SYSTEMS: Out of a complete 14 system review of symptoms, the patient complains only of the following symptoms, and all other  reviewed systems are negative.  See HPI  ALLERGIES: Allergies  Allergen Reactions   Lisinopril Swelling    SOB & swelling    Amlodipine Swelling    Swelling face   Anastrozole Hives and Other (See Comments)   Flagyl [Metronidazole] Nausea Only   Nsaids Other (See Comments)    CKD - per PCP Other reaction(s): Other (See Comments) CKD per PCP   Tetracycline Hcl Other (See Comments)    HOME MEDICATIONS: Outpatient Medications Prior to Visit  Medication Sig Dispense Refill   b  complex vitamins capsule Take 1 capsule by mouth daily.     Cholecalciferol (VITAMIN D3) 2000 units TABS Take 2,000 Units by mouth daily.      clidinium-chlordiazePOXIDE (LIBRAX) 5-2.5 MG capsule Take 1 capsule by mouth as needed. 90 capsule 3   Coenzyme Q10 (CO Q-10) 100 MG CAPS Take 200 mg by mouth daily.     Cranberry-Vitamin C-Probiotic (AZO CRANBERRY) 250-30 MG TABS Take 1 tablet by mouth 2 (two) times daily.      fluticasone (FLONASE) 50 MCG/ACT nasal spray Place 1 spray into both nostrils daily as needed for allergies or rhinitis.     Glucosamine HCl (GLUCOSAMINE PO) Take by mouth 2 (two) times daily. 1500/1200 mg     MAGNESIUM PO Take 240 mg by mouth daily.     Multiple Vitamin (MULTIVITAMIN) tablet Take 1 tablet by mouth every evening.      NON FORMULARY Citrical plus d 2 a day     Omega-3 Fatty Acids (FISH OIL) 1200 MG CAPS Take 1,200 mg by mouth daily.      pantoprazole (PROTONIX) 40 MG tablet Take 1 tablet (40 mg total) by mouth daily. 90 tablet 3   pregabalin (LYRICA) 50 MG capsule Take up to 2 tablets at bedtime for restless leg symptoms 180 capsule 1   rosuvastatin (CRESTOR) 10 MG tablet Take 1 tablet (10 mg total) by mouth daily. 90 tablet 2   TURMERIC PO Take 1 capsule by mouth 2 (two) times a day. 1000 mg     No facility-administered medications prior to visit.    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%   Cataract    REMOVED 05-2020 AND 06-2020 BILAT   Colon polyps    Hyperplastic    Diverticulosis of colon (without mention of hemorrhage)    Esophageal stricture    GERD (gastroesophageal reflux disease)    History of radiation therapy    History of urinary tract infection    Hypertension    CONTROLLED- OFF MEDS -   Lower back pain    Neuromuscular disorder ([place])    peripheral nueropathy   Numbness    feet bilat at nighttime   Osteopenia     Personal history of radiation therapy 2006   Restless leg syndrome 03/27/2014   Tremor, 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;  Service: General;  Laterality: Left;   BREAST SURGERY     left 2006  CESAREAN SECTION     COLONOSCOPY     EYE SURGERY  2022   FRACTURE SURGERY  2017   ORIF ANKLE FRACTURE Left 08/24/2015   Procedure: OPEN REDUCTION INTERNAL FIXATION (ORIF) LEFT ANKLE FRACTURE;  Surgeon: Mcarthur Rossetti, MD;  Location: [facility];  Service: Orthopedics;  Laterality: Left;  General with a popliteal block   POLYPECTOMY     HPP 2011    FAMILY HISTORY: Family History  Problem Relation Age of Onset   Heart attack Mother    Varicose Veins Mother    COPD Father    Heart disease Father    Emphysema Father        smoker   Breast cancer Sister 6       mastectomy; metastasis to liver and lung   Cancer Sister    Obesity Brother    Breast cancer Maternal Aunt        dx. late 65s   Cancer Maternal Aunt    Alzheimer's disease Maternal Aunt    Breast cancer Maternal Aunt        dx. 70s   Squamous cell carcinoma Maternal Aunt        recently removed; dx. 95   Cancer Maternal Aunt    Heart disease Maternal Grandmother    Congestive Heart Failure Maternal Grandmother    Heart disease Paternal Grandmother    Breast cancer Cousin 62       genetic testing in approx 2015   Ovarian cancer Cousin        dx. 71s   Esophageal cancer Cousin        not a smoker   Neuropathy Neg Hx    Thyroid disease Neg Hx    Colon cancer Neg Hx    Colon polyps Neg Hx    Rectal cancer Neg Hx    Stomach  cancer Neg Hx     SOCIAL HISTORY: Social History   Socioeconomic History   Marital status: Married    Spouse name: Not on file   Number of children: 2   Years of education: AS   Highest education level: Not on file  Occupational History   Occupation: Programmer, multimedia: [employer]   Occupation: Retired  Tobacco Use   Smoking status: Never   Smokeless tobacco: Never  Scientific laboratory technician Use: Never used  Substance and Sexual Activity   Alcohol use: Yes    Comment: rare occ drink wine   Drug use: No   Sexual activity: Yes  Other Topics Concern   Not on file  Social History Narrative   Lives at home w/ her husband   Right-handed   Occasionally drinks tea   Social Determinants of Health   Financial Resource Strain: Low Risk  (05/27/2021)   Overall Financial Resource Strain (CARDIA)    Difficulty of Paying Living Expenses: Not very hard  Food Insecurity: No Food Insecurity (05/27/2021)   Hunger Vital Sign    Worried About Running Out of Food in the Last Year: Never true    [place] in the Last Year: Never true  Transportation Needs: No Transportation Needs (05/27/2021)   PRAPARE - Hydrologist (Medical): No    Lack of Transportation (Non-Medical): No  Physical Activity: Sufficiently Active (05/27/2021)   Exercise Vital Sign    Days of Exercise per Week: 3 days    Minutes of Exercise per Session: 90 min  Stress: Not on  file  Social Connections: Socially Integrated (05/27/2021)   Social Connection and Isolation Panel [NHANES]    Frequency of Communication with Friends and Family: More than three times a week    Frequency of Social Gatherings with Friends and Family: Three times a week    Attends Religious Services: More than 4 times per year    Active Member of Clubs or Organizations: Yes    Attends Archivist Meetings: 1 to 4 times per year    Marital Status: Married  Human resources officer Violence: Not At Risk (05/27/2021)    Humiliation, Afraid, Rape, and Kick questionnaire    Fear of Current or Ex-Partner: No    Emotionally Abused: No    Physically Abused: No    Sexually Abused: No   PHYSICAL EXAM  Vitals:   07/02/22 0933  BP: 132/68  Pulse: 71  Weight: 169 lb (76.7 kg)  Height: '5\' 4"'$  (1.626 m)   Body mass index is 29.01 kg/m.  Generalized: Well developed, in no acute distress  Neurological examination  Mentation: Alert oriented to time, place, history taking. Follows all commands speech and language fluent Cranial nerve II-XII: Pupils were equal round reactive to light. Extraocular movements were full, visual field were full on confrontational test. Facial sensation and strength were normal. Head turning and shoulder shrug  were normal and symmetric.  Intermittent side to side head tremor mild noted. Motor: The motor testing reveals 5 over 5 strength of all 4 extremities. Good symmetric motor tone is noted throughout.  Sensory: Sensory testing is intact to soft touch on all 4 extremities. No evidence of extinction is noted.  Coordination: Cerebellar testing reveals good finger-nose-finger and heel-to-shin bilaterally.  Gait and station: Gait is normal.  Reflexes: Deep tendon reflexes are symmetric but are decreased  DIAGNOSTIC DATA (LABS, IMAGING, TESTING) - I reviewed patient records, labs, notes, testing and imaging myself where available.  Lab Results  Component Value Date   WBC 4.3 12/24/2021   HGB 14.0 12/24/2021   HCT 41.8 12/24/2021   MCV 94 12/24/2021   PLT 197 12/24/2021      Component Value Date/Time   NA 141 01/23/2021 0816   NA 140 11/09/2019 0945   K 3.9 01/23/2021 0816   CL 108 01/23/2021 0816   CO2 25 01/23/2021 0816   GLUCOSE 124 (H) 01/23/2021 0816   BUN 16 01/23/2021 0816   BUN 14 11/09/2019 0945   CREATININE 0.96 01/23/2021 0816   CALCIUM 10.2 01/23/2021 0816   PROT 7.1 01/23/2021 0816   PROT 6.7 12/21/2020 0824   ALBUMIN 3.6 01/23/2021 0816   AST 24  01/23/2021 0816   ALT 19 01/23/2021 0816   ALKPHOS 60 01/23/2021 0816   BILITOT 0.6 01/23/2021 0816   GFRNONAA >60 01/23/2021 0816   GFRAA 69 11/09/2019 0945   Lab Results  Component Value Date   CHOL 119 01/13/2020   HDL 49 01/13/2020   LDLCALC 49 01/13/2020   TRIG 117 01/13/2020   CHOLHDL 2.4 01/13/2020   No results found for: "HGBA1C" Lab Results  Component Value Date   [id number] 12/21/2020   No results found for: "TSH"  Butler Denmark, AGNP-C, DNP 07/02/2022, 9:47 AM [facility] [address] [place], [place] [address] [phone]

## 2022-07-02 ENCOUNTER — Encounter: Payer: Self-pay | Admitting: Neurology

## 2022-07-02 ENCOUNTER — Ambulatory Visit (INDEPENDENT_AMBULATORY_CARE_PROVIDER_SITE_OTHER): Payer: Medicare Other | Admitting: Neurology

## 2022-07-02 VITALS — BP 132/68 | HR 71 | Ht 64.0 in | Wt 169.0 lb

## 2022-07-02 DIAGNOSIS — G629 Polyneuropathy, unspecified: Secondary | ICD-10-CM

## 2022-07-02 DIAGNOSIS — G2581 Restless legs syndrome: Secondary | ICD-10-CM | POA: Diagnosis not present

## 2022-07-18 ENCOUNTER — Encounter: Payer: Self-pay | Admitting: Neurology

## 2022-07-21 MED ORDER — PREGABALIN 50 MG PO CAPS: ORAL_CAPSULE | ORAL | 1 refills | Status: DC

## 2022-10-03 ENCOUNTER — Telehealth: Payer: Self-pay | Admitting: Hematology and Oncology

## 2022-10-07 ENCOUNTER — Other Ambulatory Visit: Payer: Self-pay | Admitting: Cardiology

## 2022-10-29 DIAGNOSIS — Z961 Presence of intraocular lens: Secondary | ICD-10-CM | POA: Diagnosis not present

## 2022-11-07 ENCOUNTER — Other Ambulatory Visit: Payer: Self-pay | Admitting: Obstetrics and Gynecology

## 2022-11-07 DIAGNOSIS — Z853 Personal history of malignant neoplasm of breast: Secondary | ICD-10-CM

## 2022-11-10 ENCOUNTER — Telehealth: Payer: Self-pay

## 2022-11-10 NOTE — Telephone Encounter (Signed)
Pt's appt r/s from Dr Pamelia Hoit to see Lillard Anes, NP for her yearly f/u 11/14/22 at 1115. Pt is in agreement with this change.

## 2022-11-14 ENCOUNTER — Encounter: Payer: Self-pay | Admitting: Adult Health

## 2022-11-14 ENCOUNTER — Inpatient Hospital Stay: Payer: Medicare Other | Attending: Hematology and Oncology | Admitting: Adult Health

## 2022-11-14 ENCOUNTER — Other Ambulatory Visit: Payer: Self-pay

## 2022-11-14 ENCOUNTER — Ambulatory Visit: Payer: Medicare Other | Admitting: Hematology and Oncology

## 2022-11-14 VITALS — BP 130/76 | HR 77 | Temp 97.3°F | Resp 15 | Wt 177.8 lb

## 2022-11-14 DIAGNOSIS — Z803 Family history of malignant neoplasm of breast: Secondary | ICD-10-CM | POA: Insufficient documentation

## 2022-11-14 DIAGNOSIS — Z8041 Family history of malignant neoplasm of ovary: Secondary | ICD-10-CM | POA: Diagnosis not present

## 2022-11-14 DIAGNOSIS — D0512 Intraductal carcinoma in situ of left breast: Secondary | ICD-10-CM

## 2022-11-14 DIAGNOSIS — Z86000 Personal history of in-situ neoplasm of breast: Secondary | ICD-10-CM | POA: Insufficient documentation

## 2022-11-14 DIAGNOSIS — Z800 Family history of malignant neoplasm of digestive organs: Secondary | ICD-10-CM | POA: Insufficient documentation

## 2022-11-14 NOTE — Progress Notes (Signed)
[facility] Cancer Follow up:    Meghan Soho, PA-C [address] [place] [address]   DIAGNOSIS:  Cancer Staging  Ductal carcinoma in situ (DCIS) of left breast Staging form: Breast, AJCC 8th Edition - Clinical stage from 01/23/2021: Stage 0 (cTis (DCIS), cN0, cM0, ER+, PR+, HER2: Not Assessed) - Signed by Lowella Dell, MD on 01/23/2021 Stage prefix: Initial diagnosis Nuclear grade: G2 Laterality: Left Staged by: Pathologist and managing physician Stage used in treatment planning: Yes National guidelines used in treatment planning: Yes Type of national guideline used in treatment planning: NCCN - Pathologic stage from 02/12/2021: Stage 0 (pTis (DCIS), pN0, cM0, ER+, PR+) - Signed by Loa Socks, NP on 05/25/2021 Stage prefix: Initial diagnosis   SUMMARY OF ONCOLOGIC HISTORY: Oncology History  Ductal carcinoma in situ (DCIS) of left breast  10/08/2004 Surgery   s/p left lumpectomy 10/08/2004 for a pT1b pN0, stage IA invasive ductal carcinoma, grade 2, estrogen and progesterone receptor positive, HER2 not amplified, with an MIB-1 of 17%             (A) status post adjuvant MammoSite radiation             (B) status post tamoxifen for 5 years   12/22/2014 Genetic Testing   Genes tested: ATM, BARD1, BRCA1, BRCA2, BRIP1, CDH1, CHEK2, FANCC, MLH1, MSH2, MSH6, NBN, PALB2, PMS2, PTEN, RAD51C, RAD51D, TP53, and XRCC2.  .  Genetic testing was normal, and did not reveal a deleterious mutation in these genes.  Additionally, no variants of uncertain significance (VUSs) were found   01/14/2021 Initial Biopsy   Diagnosis Breast, left, needle core biopsy, upper outer, X clip - DUCTAL CARCINOMA IN SITU, INTERMEDIATE GRADE WITH CALCIFICATIONS. SEE NOTE Diagnosis Note DCIS measures 0.4 cm in greatest linear dimension  PROGNOSTIC INDICATORS Results: Estrogen Receptor: 100%, POSITIVE, STRONG STAINING INTENSITY Progesterone Receptor: 30%, POSITIVE, STRONG  STAINING INTENSITY   01/23/2021 Cancer Staging   Staging form: Breast, AJCC 8th Edition - Clinical stage from 01/23/2021: Stage 0 (cTis (DCIS), cN0, cM0, ER+, PR+, HER2: Not Assessed) - Signed by Lowella Dell, MD on 01/23/2021 Stage prefix: Initial diagnosis Nuclear grade: G2 Laterality: Left Staged by: Pathologist and managing physician Stage used in treatment planning: Yes National guidelines used in treatment planning: Yes Type of national guideline used in treatment planning: NCCN   02/12/2021 Cancer Staging   Staging form: Breast, AJCC 8th Edition - Pathologic stage from 02/12/2021: Stage 0 (pTis (DCIS), pN0, cM0, ER+, PR+) - Signed by Loa Socks, NP on 05/25/2021 Stage prefix: Initial diagnosis   02/12/2021 Definitive Surgery   FINAL MICROSCOPIC DIAGNOSIS:   A. BREAST, LEFT, LUMPECTOMY:  - Focus of intermediate to high-grade ductal carcinoma in situ  - Negative for invasive carcinoma  - Resection margins are negative for DCIS  - Biopsy site changes  - See oncology table   B. BREAST, LEFT ADDITIONAL SUPERIOR MARGIN, EXCISION:  - Benign breast parenchyma with no specific histopathologic changes.  - Negative for carcinoma   C. BREAST, LEFT ADDITIONAL MEDIAL MARGIN, EXCISION:  - Benign breast parenchyma with no specific histopathologic changes.  - Negative for carcinoma   D. BREAST, LEFT ADDITIONAL INFERIOR MARGIN, EXCISION:  - Benign breast parenchyma with no specific histopathologic changes.  - Negative for carcinoma   E. BREAST, LEFT ADDITIONAL LATERAL MARGIN, EXCISION:  - Benign breast parenchyma with no specific histopathologic changes.  - Negative for carcinoma   F. BREAST, LEFT ADDITIONAL POSTERIOR MARGIN, EXCISION:  -  Benign breast parenchyma with no specific histopathologic changes.  - Negative for carcinoma    COMMENT:  A.  Focus of DCIS measures 0.2 cm in greatest dimension.    04/2021 - 04/2021 Anti-estrogen oral therapy   anastrozole  daily-unable to tolerate, caused dizziness     CURRENT THERAPY: observation  INTERVAL HISTORY: Meghan [name] 77 y.o. female returns for follow-up of her history of breast cancer.  She is unable to tolerate any antiestrogen therapy and continues on observation alone.  Her most recent mammogram occurred on 01/10/2022 and demonstrated no mammographic evidence of malignancy and breast density category B.  She denies any health changes this past year.  She has no breast concerns today.  She continues to work part-time at [employer] as a [employer] 3 days a week.   Patient Active Problem List   Diagnosis Date Noted   Polyneuropathy 12/24/2021   Body mass index (BMI) 35.0-35.9, adult 05/25/2021   Chronic kidney disease, stage 3a (HCC) 05/25/2021   Impaired fasting glucose 05/25/2021   Personal history of colonic polyps 05/25/2021   Osteopenia 05/25/2021   Multinodular goiter 05/25/2021   Ductal carcinoma in situ (DCIS) of left breast 01/21/2021   Tremor, 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   Essential (primary) hypertension 05/11/2020   Spondylolisthesis at L4-L5 level 08/15/2019   Solitary pulmonary nodule 10/12/2015   Ankle fracture, left 08/24/2015   Family history of breast cancer in female 12/28/2014   Family history of ovarian cancer 12/28/2014   Genetic testing 12/07/2014   Restless leg syndrome 03/27/2014   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  MEDICAL HISTORY: 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%   Cataract    REMOVED 05-2020 AND 06-2020 BILAT   Colon polyps    Hyperplastic    Diverticulosis of colon (without mention of hemorrhage)    Esophageal stricture    GERD (gastroesophageal reflux disease)     History of radiation therapy    History of urinary tract infection    Hypertension    CONTROLLED- OFF MEDS -   Lower back pain    Neuromuscular disorder (HCC)    peripheral nueropathy   Numbness    feet bilat at nighttime   Osteopenia    Personal history of radiation therapy 2006   Restless leg syndrome 03/27/2014   Tremor, 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   Head and neck tremor   Trimalleolar fracture of left ankle 08/24/2015   Urinary urgency     SURGICAL HISTORY: Past Surgical History:  Procedure Laterality Date   BREAST LUMPECTOMY Left 2006   BREAST LUMPECTOMY WITH RADIOACTIVE SEED LOCALIZATION Left 02/12/2021   Procedure: LEFT BREAST LUMPECTOMY WITH RADIOACTIVE SEED LOCALIZATION;  Surgeon: Harriette Bouillon, MD;  Location: [facility];  Service: General;  Laterality: Left;   BREAST SURGERY     left 2006   CESAREAN SECTION     COLONOSCOPY     EYE SURGERY  2022   FRACTURE SURGERY  2017   ORIF ANKLE FRACTURE Left 08/24/2015   Procedure: OPEN REDUCTION INTERNAL FIXATION (ORIF) LEFT ANKLE FRACTURE;  Surgeon: Kathryne Hitch, MD;  Location: [facility];  Service: Orthopedics;  Laterality: Left;  General with a popliteal block   POLYPECTOMY  HPP 2011    SOCIAL HISTORY: Social History   Socioeconomic History   Marital status: Married    Spouse name: Not on file   Number of children: 2   Years of education: AS   Highest education level: Not on file  Occupational History   Occupation: Teacher, adult education: [employer]   Occupation: Retired  Tobacco Use   Smoking status: Never   Smokeless tobacco: Never  Vaping Use   Vaping status: Never Used  Substance and Sexual Activity   Alcohol use: Yes    Comment: rare occ drink wine   Drug use: No   Sexual activity: Yes  Other Topics Concern   Not on file  Social History Narrative   Lives at home w/ her husband   Right-handed   Occasionally drinks tea   Social Determinants of Health   Financial Resource  Strain: Low Risk  (05/27/2021)   Overall Financial Resource Strain (CARDIA)    Difficulty of Paying Living Expenses: Not very hard  Food Insecurity: No Food Insecurity (05/27/2021)   Hunger Vital Sign    Worried About Running Out of Food in the Last Year: Never true    Ran Out of Food in the Last Year: Never true  Transportation Needs: No Transportation Needs (05/27/2021)   PRAPARE - Administrator, Civil Service (Medical): No    Lack of Transportation (Non-Medical): No  Physical Activity: Sufficiently Active (05/27/2021)   Exercise Vital Sign    Days of Exercise per Week: 3 days    Minutes of Exercise per Session: 90 min  Stress: Not on file  Social Connections: Socially Integrated (05/27/2021)   Social Connection and Isolation Panel [NHANES]    Frequency of Communication with Friends and Family: More than three times a week    Frequency of Social Gatherings with Friends and Family: Three times a week    Attends Religious Services: More than 4 times per year    Active Member of Clubs or Organizations: Yes    Attends Banker Meetings: 1 to 4 times per year    Marital Status: Married  Catering manager Violence: Not At Risk (05/27/2021)   Humiliation, Afraid, Rape, and Kick questionnaire    Fear of Current or Ex-Partner: No    Emotionally Abused: No    Physically Abused: No    Sexually Abused: No    FAMILY HISTORY: Family History  Problem Relation Age of Onset   Heart attack Mother    Varicose Veins Mother    COPD Father    Heart disease Father    Emphysema Father        smoker   Breast cancer Sister 10       mastectomy; metastasis to liver and lung   Cancer Sister    Obesity Brother    Breast cancer Maternal Aunt        dx. late 42s   Cancer Maternal Aunt    Alzheimer's disease Maternal Aunt    Breast cancer Maternal Aunt        dx. 70s   Squamous cell carcinoma Maternal Aunt        recently removed; dx. 95   Cancer Maternal Aunt    Heart disease  Maternal Grandmother    Congestive Heart Failure Maternal Grandmother    Heart disease Paternal Grandmother    Breast cancer Cousin 95       genetic testing in approx 2015   Ovarian cancer Cousin  dx. 65s   Esophageal cancer Cousin        not a smoker   Neuropathy Neg Hx    Thyroid disease Neg Hx    Colon cancer Neg Hx    Colon polyps Neg Hx    Rectal cancer Neg Hx    Stomach cancer Neg Hx     Review of Systems  Constitutional:  Negative for appetite change, chills, fatigue, fever and unexpected weight change.  HENT:   Negative for hearing loss, lump/mass and trouble swallowing.   Eyes:  Negative for eye problems and icterus.  Respiratory:  Negative for chest tightness, cough and shortness of breath.   Cardiovascular:  Negative for chest pain, leg swelling and palpitations.  Gastrointestinal:  Negative for abdominal distention, abdominal pain, constipation, diarrhea, nausea and vomiting.  Endocrine: Negative for hot flashes.  Genitourinary:  Negative for difficulty urinating.   Musculoskeletal:  Negative for arthralgias.  Skin:  Negative for itching and rash.  Neurological:  Negative for dizziness, extremity weakness, headaches and numbness.  Hematological:  Negative for adenopathy. Does not bruise/bleed easily.  Psychiatric/Behavioral:  Negative for depression. The patient is not nervous/anxious.       PHYSICAL EXAMINATION   Onc Performance Status - 11/14/22 1113       ECOG Perf Status   ECOG Perf Status Fully active, able to carry on all pre-disease performance without restriction      KPS SCALE   KPS % SCORE Able to carry on normal activity, minor s/s of disease             Vitals:   11/14/22 1108  BP: 130/76  Pulse: 77  Resp: 15  Temp: (!) 97.3 F (36.3 C)  SpO2: 98%    Physical Exam Constitutional:      General: She is not in acute distress.    Appearance: Normal appearance. She is not toxic-appearing.  HENT:     Head: Normocephalic and  atraumatic.     Mouth/Throat:     Mouth: Mucous membranes are moist.     Pharynx: Oropharynx is clear. No oropharyngeal exudate or posterior oropharyngeal erythema.  Eyes:     General: No scleral icterus. Cardiovascular:     Rate and Rhythm: Normal rate and regular rhythm.     Pulses: Normal pulses.     Heart sounds: Normal heart sounds.  Pulmonary:     Effort: Pulmonary effort is normal.     Breath sounds: Normal breath sounds.  Chest:     Comments: Right breast is benign left breast status postlumpectomy and radiation no sign of local recurrence. Abdominal:     General: Abdomen is flat. Bowel sounds are normal. There is no distension.     Palpations: Abdomen is soft.     Tenderness: There is no abdominal tenderness.  Musculoskeletal:        General: No swelling.     Cervical back: Neck supple.  Lymphadenopathy:     Cervical: No cervical adenopathy.  Skin:    General: Skin is warm and dry.     Findings: No rash.  Neurological:     General: No focal deficit present.     Mental Status: She is alert.  Psychiatric:        Mood and Affect: Mood normal.        Behavior: Behavior normal.    ASSESSMENT and THERAPY PLAN:   Ductal carcinoma in situ (DCIS) of left breast (1) s/p left lumpectomy 10/08/2004 for a pT1b pN0, stage  IA invasive ductal carcinoma, grade 2, estrogen and progesterone receptor positive, HER2 not amplified, with an MIB-1 of 17%             (A) status post adjuvant MammoSite radiation             (B) status post tamoxifen for 5 years  (2) genetics testing 2006, updated 2018: No deleterious mutations  (3) status post left breast biopsy 01/14/2021 for ductal carcinoma in situ, grade 2, estrogen and progesterone receptor positive.  (4) status post left lumpectomy 02/12/2021 showing a 0.2 cm focus of ductal carcinoma in situ, intermediate to high-grade, with negative margins.  (5) opted against adjuvant radiation    --------------------------------------------------------------------------------------------------------------------------------- Current treatment: anastrozole started 04/04/2021 discontinued because of toxicities  Breast cancer surveillance: 1.  Breast exam 11/14/2022: Benign 2. mammogram scheduled for 01/16/2023  No signs of cancer recurrence.    I recommended healthy diet, exercise, and continued f/u with her PCP.  We will see her back in 1 year for follow-up.  She will see Dr. Luisa Hart in 6 months.   All questions were answered. The patient knows to call the clinic with any problems, questions or concerns. We can certainly see the patient much sooner if necessary.  Total encounter time:30 minutes*in face-to-face visit time, chart review, lab review, care coordination, order entry, and documentation of the encounter time.  Lillard Anes, NP 11/14/22 12:05 PM Medical Oncology and Hematology [facility] [address] [place], [place] [address] Tel. [phone]    Fax. [phone]  *Total Encounter Time as defined by the Centers for Medicare and Medicaid Services includes, in addition to the face-to-face time of a patient visit (documented in the note above) non-face-to-face time: obtaining and reviewing outside history, ordering and reviewing medications, tests or procedures, care coordination (communications with other health care professionals or caregivers) and documentation in the medical record.

## 2022-11-14 NOTE — Assessment & Plan Note (Signed)
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  (4) status post left lumpectomy 02/12/2021 showing a 0.2 cm focus of ductal carcinoma in situ, intermediate to high-grade, with negative margins.  (5) opted against adjuvant radiation   --------------------------------------------------------------------------------------------------------------------------------- Current treatment: anastrozole started 04/04/2021 discontinued because of toxicities  Breast cancer surveillance: 1.  Breast exam 11/14/2022: Benign 2. mammogram scheduled for 01/16/2023  No signs of cancer recurrence.    I recommended healthy diet, exercise, and continued f/u with her PCP.  We will see her back in 1 year for follow-up.  She will see Dr. Luisa Hart in 6 months.

## 2022-11-17 ENCOUNTER — Telehealth: Payer: Self-pay | Admitting: Adult Health

## 2022-11-17 NOTE — Telephone Encounter (Signed)
Scheduled appointment per 7/12 los. Left voicemail.

## 2022-12-19 ENCOUNTER — Other Ambulatory Visit: Payer: Self-pay | Admitting: Family Medicine

## 2022-12-19 DIAGNOSIS — K219 Gastro-esophageal reflux disease without esophagitis: Secondary | ICD-10-CM | POA: Diagnosis not present

## 2022-12-19 DIAGNOSIS — R7301 Impaired fasting glucose: Secondary | ICD-10-CM | POA: Diagnosis not present

## 2022-12-19 DIAGNOSIS — Z6832 Body mass index (BMI) 32.0-32.9, adult: Secondary | ICD-10-CM | POA: Diagnosis not present

## 2022-12-19 DIAGNOSIS — M8580 Other specified disorders of bone density and structure, unspecified site: Secondary | ICD-10-CM | POA: Diagnosis not present

## 2022-12-19 DIAGNOSIS — N1831 Chronic kidney disease, stage 3a: Secondary | ICD-10-CM | POA: Diagnosis not present

## 2022-12-19 DIAGNOSIS — G629 Polyneuropathy, unspecified: Secondary | ICD-10-CM | POA: Diagnosis not present

## 2022-12-19 DIAGNOSIS — E7800 Pure hypercholesterolemia, unspecified: Secondary | ICD-10-CM | POA: Diagnosis not present

## 2022-12-19 DIAGNOSIS — E042 Nontoxic multinodular goiter: Secondary | ICD-10-CM

## 2022-12-19 DIAGNOSIS — R911 Solitary pulmonary nodule: Secondary | ICD-10-CM | POA: Diagnosis not present

## 2022-12-19 DIAGNOSIS — Z0000 Encounter for general adult medical examination without abnormal findings: Secondary | ICD-10-CM | POA: Diagnosis not present

## 2022-12-26 ENCOUNTER — Ambulatory Visit
Admission: RE | Admit: 2022-12-26 | Discharge: 2022-12-26 | Disposition: A | Discharge status: Home / Self Care | Payer: Medicare Other | Source: Ambulatory Visit | Attending: Family Medicine | Admitting: Family Medicine

## 2022-12-26 DIAGNOSIS — E042 Nontoxic multinodular goiter: Secondary | ICD-10-CM | POA: Diagnosis not present

## 2023-01-13 NOTE — Progress Notes (Unsigned)
Patient: Meghan [name] Date of Birth: 23-Mar-1946  Reason for Visit: Follow up for RLS History from: Patient Primary Neurologist: [facility]   ASSESSMENT AND PLAN 77 y.o. year old female   1.  Restless leg syndrome 2.  Polyneuropathy  -Overall, RLS symptoms under very good control, will continue Lyrica 50 mg, 2 tablets at bedtime -Gait instability potentially from polyneuropathy or left foot surgery from injury in 2019 requiring metal hardware. Discussed PT, she wants to monitor for now. -Ferritin level was 43 August 2023 (if < 75, may exacerbate RLS sx), only oral iron twice weekly, PCP can follow iron panels -Tried and failed: Mirapex, gabapentin -Follow-up in 6 months or sooner if needed  Meds ordered this encounter  Medications   pregabalin (LYRICA) 50 MG capsule    Sig: Take up to 2 tablets at bedtime for restless leg symptoms    Dispense:  180 capsule    Refill:  1   HISTORY OF PRESENT ILLNESS: Today 01/14/23 Remains on Lyrica 50 mg, 2 capsules at bedtime. Mostly under good control. Only 3 times since Feb she has had to get up and walk around at night, take Tylenol for RLS. Is on Iron twice weekly. Works part-time for [employer]. At night her feet burn and tingle, magnesium has helped. With walking some gait instability, no headache, dizziness. No falls.   07/02/22 SS: Takes Lyrica 50 mg, 2 capsules at bedtime, 1-2 nights a week RLS symptoms are still bothersome, may take Extra Strength Tylenol or has occasionally taken extra Lyrica. Likely RLS  symptoms related to her mind racing, stress at work. Sometimes in the morning feels off balance first thing. Likes Lyrica better than gabapentin. She did take some Iron but it gave her diarrhea, it did help her RLS.  Started multivitamin with some iron.  Update 12/24/21 SS: Meghan [name] is here today for follow-up.  Has been seen for RLS, is on Mirapex and gabapentin. 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  Husband has poor memory. She works part time [employer]. She stopped the Mirapex last October due to feeling groggy in the AM, off balance. RLS symptoms have worsened, most nights has to take Tylenol to get to sleep. Still unsteady at times, often related to chronic ankle injury to left. No falls. RLS symptoms bother her 5/7 nights a week. Doesn't bother her during the day. Recurrent breast cancer last October had lumpectomy. Not on any chemo agents, mammogram next month.  On B12 supplement.  Intermittent head and neck tremor exacerbated by anxiety.  Also on magnesium.  HISTORY  12/21/20 Dr. Anne Hahn: Ms. Mateus is a 77 year old right-handed white female with a history of restless leg syndrome dating back over a years.  The patient returns to the office today with some new issues.  She has reported over the last 2 or 3 months that she has noted some sensation of imbalance.  She notes that when she closes her eyes that she has difficulty maintaining posture.  She recently fell off of a stepladder, possibly related to this imbalance issue.  She notes that if she touches the wall or chair, this improves the sensation of imbalance.  She does report some numbness in the toes of the feet at night with some stinging sensations.  She does have low back pain without radiation down the lower extremities.  She will occasionally get injections in the back with some benefit.  She has been told that she has spondylolisthesis.  The patient denies any  trouble controlling the bowels or the bladder.  She denies any numbness or sensory alteration in the hands.  She denies neck pain.  She has developed a mild tremor that is side to side involving the head and neck, no tremors of the arms are noted.  She denies any family history of diabetes.  REVIEW OF SYSTEMS: Out of a complete 14 system review of symptoms, the patient complains only of the following symptoms, and all other reviewed systems are negative.  See  HPI  ALLERGIES: Allergies  Allergen Reactions   Lisinopril Swelling    SOB & swelling    Amlodipine Swelling    Swelling face   Anastrozole Hives and Other (See Comments)   Flagyl [Metronidazole] Nausea Only   Nsaids Other (See Comments)    CKD - per PCP Other reaction(s): Other (See Comments) CKD per PCP   Tetracycline Hcl Other (See Comments)    HOME MEDICATIONS: Outpatient Medications Prior to Visit  Medication Sig Dispense Refill   b complex vitamins capsule Take 1 capsule by mouth daily.     Cholecalciferol (VITAMIN D3) 2000 units TABS Take 2,000 Units by mouth daily.      clidinium-chlordiazePOXIDE (LIBRAX) 5-2.5 MG capsule Take 1 capsule by mouth as needed. 90 capsule 3   Coenzyme Q10 (CO Q-10) 100 MG CAPS Take 200 mg by mouth daily.     Cranberry-Vitamin C-Probiotic (AZO CRANBERRY) 250-30 MG TABS Take 1 tablet by mouth 2 (two) times daily.      fluticasone (FLONASE) 50 MCG/ACT nasal spray Place 1 spray into both nostrils daily as needed for allergies or rhinitis.     Glucosamine HCl (GLUCOSAMINE PO) Take by mouth 2 (two) times daily. 1500/1200 mg     MAGNESIUM PO Take 240 mg by mouth daily.     Multiple Vitamin (MULTIVITAMIN) tablet Take 1 tablet by mouth every evening.      NON FORMULARY Citrical plus d 2 a day     Omega-3 Fatty Acids (FISH OIL) 1200 MG CAPS Take 1,200 mg by mouth daily.      OVER THE COUNTER MEDICATION Take 1 tablet by mouth daily. Citracal plus     pantoprazole (PROTONIX) 40 MG tablet Take 1 tablet (40 mg total) by mouth daily. 90 tablet 3   pregabalin (LYRICA) 50 MG capsule Take up to 2 tablets at bedtime for restless leg symptoms 180 capsule 1   rosuvastatin (CRESTOR) 10 MG tablet TAKE 1 TABLET DAILY 90 tablet 3   TURMERIC PO Take 1 capsule by mouth 2 (two) times a day. 1000 mg     No facility-administered medications prior to visit.    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%   Cataract    REMOVED 05-2020 AND 06-2020 BILAT   Colon polyps    Hyperplastic    Diverticulosis of colon (without mention of hemorrhage)    Esophageal stricture    GERD (gastroesophageal reflux disease)    History of radiation therapy    History of urinary tract infection    Hypertension    CONTROLLED- OFF MEDS -   Lower back pain    Neuromuscular disorder (HCC)    peripheral nueropathy   Numbness    feet bilat at nighttime   Osteopenia    Personal history of radiation therapy 2006   Restless leg syndrome 03/27/2014  Tremor, 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   Head and neck tremor   Trimalleolar fracture of left ankle 08/24/2015   Urinary urgency     PAST SURGICAL HISTORY: Past Surgical History:  Procedure Laterality Date   BREAST LUMPECTOMY Left 2006   BREAST LUMPECTOMY WITH RADIOACTIVE SEED LOCALIZATION Left 02/12/2021   Procedure: LEFT BREAST LUMPECTOMY WITH RADIOACTIVE SEED LOCALIZATION;  Surgeon: Harriette Bouillon, MD;  Location: [facility];  Service: General;  Laterality: Left;   BREAST SURGERY     left 2006   CESAREAN SECTION     COLONOSCOPY     EYE SURGERY  2022   FRACTURE SURGERY  2017   ORIF ANKLE FRACTURE Left 08/24/2015   Procedure: OPEN REDUCTION INTERNAL FIXATION (ORIF) LEFT ANKLE FRACTURE;  Surgeon: Kathryne Hitch, MD;  Location: [facility];  Service: Orthopedics;  Laterality: Left;  General with a popliteal block   POLYPECTOMY     HPP 2011    FAMILY HISTORY: Family History  Problem Relation Age of Onset   Heart attack Mother    Varicose Veins Mother    COPD Father    Heart disease Father    Emphysema Father        smoker   Breast cancer Sister 21       mastectomy; metastasis to liver and lung   Cancer Sister    Obesity Brother    Breast cancer Maternal Aunt        dx. late 7s   Cancer Maternal Aunt    Alzheimer's disease Maternal Aunt    Breast cancer Maternal Aunt        dx.  70s   Squamous cell carcinoma Maternal Aunt        recently removed; dx. 95   Cancer Maternal Aunt    Heart disease Maternal Grandmother    Congestive Heart Failure Maternal Grandmother    Heart disease Paternal Grandmother    Breast cancer Cousin 48       genetic testing in approx 2015   Ovarian cancer Cousin        dx. 50s   Esophageal cancer Cousin        not a smoker   Neuropathy Neg Hx    Thyroid disease Neg Hx    Colon cancer Neg Hx    Colon polyps Neg Hx    Rectal cancer Neg Hx    Stomach cancer Neg Hx     SOCIAL HISTORY: Social History   Socioeconomic History   Marital status: Married    Spouse name: Not on file   Number of children: 2   Years of education: AS   Highest education level: Not on file  Occupational History   Occupation: Teacher, adult education: [employer]   Occupation: Retired  Tobacco Use   Smoking status: Never   Smokeless tobacco: Never  Vaping Use   Vaping status: Never Used  Substance and Sexual Activity   Alcohol use: Yes    Comment: rare occ drink wine   Drug use: No   Sexual activity: Yes  Other Topics Concern   Not on file  Social History Narrative   Lives at home w/ her husband   Right-handed   Occasionally drinks tea   Social Determinants of Health   Financial Resource Strain: Low Risk  (05/27/2021)   Overall Financial Resource Strain (CARDIA)    Difficulty of Paying Living Expenses: Not very hard  Food Insecurity: No Food Insecurity (05/27/2021)   Hunger Vital Sign  Worried About Programme researcher, broadcasting/film/video in the Last Year: Never true    Ran Out of Food in the Last Year: Never true  Transportation Needs: No Transportation Needs (05/27/2021)   PRAPARE - Administrator, Civil Service (Medical): No    Lack of Transportation (Non-Medical): No  Physical Activity: Sufficiently Active (05/27/2021)   Exercise Vital Sign    Days of Exercise per Week: 3 days    Minutes of Exercise per Session: 90 min  Stress: Not on file   Social Connections: Socially Integrated (05/27/2021)   Social Connection and Isolation Panel [NHANES]    Frequency of Communication with Friends and Family: More than three times a week    Frequency of Social Gatherings with Friends and Family: Three times a week    Attends Religious Services: More than 4 times per year    Active Member of Clubs or Organizations: Yes    Attends Banker Meetings: 1 to 4 times per year    Marital Status: Married  Catering manager Violence: Not At Risk (05/27/2021)   Humiliation, Afraid, Rape, and Kick questionnaire    Fear of Current or Ex-Partner: No    Emotionally Abused: No    Physically Abused: No    Sexually Abused: No   PHYSICAL EXAM  Vitals:   01/14/23 0818  BP: 132/78  Weight: 178 lb (80.7 kg)  Height: 5\' 4"  (1.626 m)    Body mass index is 30.55 kg/m.  Generalized: Well developed, in no acute distress  Neurological examination  Mentation: Alert oriented to time, place, history taking. Follows all commands speech and language fluent Cranial nerve II-XII: Pupils were equal round reactive to light. Extraocular movements were full, visual field were full on confrontational test. Facial sensation and strength were normal. Head turning and shoulder shrug  were normal and symmetric.  Intermittent side to side head tremor mild noted. Motor: The motor testing reveals 5 over 5 strength of all 4 extremities. Good symmetric motor tone is noted throughout.  Sensory: Sensory testing is intact to soft touch on all 4 extremities. No evidence of extinction is noted.  Coordination: Cerebellar testing reveals good finger-nose-finger and heel-to-shin bilaterally.  Gait and station: Gait is slightly wide-based, independent, tandem gait is slightly unsteady. Reflexes: Deep tendon reflexes are symmetric but are decreased  DIAGNOSTIC DATA (LABS, IMAGING, TESTING) - I reviewed patient records, labs, notes, testing and imaging myself where  available.  Lab Results  Component Value Date   WBC 4.3 12/24/2021   HGB 14.0 12/24/2021   HCT 41.8 12/24/2021   MCV 94 12/24/2021   PLT 197 12/24/2021      Component Value Date/Time   NA 141 01/23/2021 0816   NA 140 11/09/2019 0945   K 3.9 01/23/2021 0816   CL 108 01/23/2021 0816   CO2 25 01/23/2021 0816   GLUCOSE 124 (H) 01/23/2021 0816   BUN 16 01/23/2021 0816   BUN 14 11/09/2019 0945   CREATININE 0.96 01/23/2021 0816   CALCIUM 10.2 01/23/2021 0816   PROT 7.1 01/23/2021 0816   PROT 6.7 12/21/2020 0824   ALBUMIN 3.6 01/23/2021 0816   AST 24 01/23/2021 0816   ALT 19 01/23/2021 0816   ALKPHOS 60 01/23/2021 0816   BILITOT 0.6 01/23/2021 0816   GFRNONAA >60 01/23/2021 0816   GFRAA 69 11/09/2019 0945   Lab Results  Component Value Date   CHOL 119 01/13/2020   HDL 49 01/13/2020   LDLCALC 49 01/13/2020   TRIG  117 01/13/2020   CHOLHDL 2.4 01/13/2020   No results found for: "HGBA1C" Lab Results  Component Value Date   VITAMINB12 463 12/21/2020   No results found for: "TSH"  Margie Ege, AGNP-C, DNP 01/14/2023, 8:47 AM [facility] [address] [place], [place] [address] [phone]

## 2023-01-14 ENCOUNTER — Ambulatory Visit (INDEPENDENT_AMBULATORY_CARE_PROVIDER_SITE_OTHER): Payer: Medicare Other | Admitting: Neurology

## 2023-01-14 ENCOUNTER — Encounter: Payer: Self-pay | Admitting: Neurology

## 2023-01-14 VITALS — BP 132/78 | Ht 64.0 in | Wt 178.0 lb

## 2023-01-14 DIAGNOSIS — G629 Polyneuropathy, unspecified: Secondary | ICD-10-CM | POA: Diagnosis not present

## 2023-01-14 DIAGNOSIS — G2581 Restless legs syndrome: Secondary | ICD-10-CM | POA: Diagnosis not present

## 2023-01-14 MED ORDER — PREGABALIN 50 MG PO CAPS: ORAL_CAPSULE | ORAL | 1 refills | Status: DC

## 2023-01-14 NOTE — Patient Instructions (Signed)
Meds ordered this encounter  Medications   pregabalin (LYRICA) 50 MG capsule    Sig: Take up to 2 tablets at bedtime for restless leg symptoms    Dispense:  180 capsule    Refill:  1

## 2023-01-16 ENCOUNTER — Ambulatory Visit
Admission: RE | Admit: 2023-01-16 | Discharge: 2023-01-16 | Disposition: A | Discharge status: Home / Self Care | Payer: Medicare Other | Source: Ambulatory Visit | Attending: Obstetrics and Gynecology | Admitting: Obstetrics and Gynecology

## 2023-01-16 DIAGNOSIS — Z853 Personal history of malignant neoplasm of breast: Secondary | ICD-10-CM

## 2023-01-30 DIAGNOSIS — Z6832 Body mass index (BMI) 32.0-32.9, adult: Secondary | ICD-10-CM | POA: Diagnosis not present

## 2023-01-30 DIAGNOSIS — Z01419 Encounter for gynecological examination (general) (routine) without abnormal findings: Secondary | ICD-10-CM | POA: Diagnosis not present

## 2023-02-12 ENCOUNTER — Other Ambulatory Visit: Payer: Self-pay | Admitting: Medical Genetics

## 2023-02-12 DIAGNOSIS — Z006 Encounter for examination for normal comparison and control in clinical research program: Secondary | ICD-10-CM

## 2023-03-05 ENCOUNTER — Other Ambulatory Visit: Payer: Self-pay

## 2023-03-05 MED ORDER — PANTOPRAZOLE SODIUM 40 MG PO TBEC: 40.0000 mg | DELAYED_RELEASE_TABLET | Freq: Every day | ORAL | 3 refills | Status: DC

## 2023-04-01 DIAGNOSIS — Z03818 Encounter for observation for suspected exposure to other biological agents ruled out: Secondary | ICD-10-CM | POA: Diagnosis not present

## 2023-04-01 DIAGNOSIS — R0981 Nasal congestion: Secondary | ICD-10-CM | POA: Diagnosis not present

## 2023-04-01 DIAGNOSIS — R051 Acute cough: Secondary | ICD-10-CM | POA: Diagnosis not present

## 2023-04-01 DIAGNOSIS — J029 Acute pharyngitis, unspecified: Secondary | ICD-10-CM | POA: Diagnosis not present

## 2023-04-10 ENCOUNTER — Ambulatory Visit: Payer: Medicare Other | Attending: Cardiology | Admitting: Cardiology

## 2023-04-10 ENCOUNTER — Encounter: Payer: Self-pay | Admitting: Cardiology

## 2023-04-10 VITALS — BP 120/84 | HR 83 | Ht 64.0 in | Wt 179.6 lb

## 2023-04-10 DIAGNOSIS — E66811 Obesity, class 1: Secondary | ICD-10-CM | POA: Insufficient documentation

## 2023-04-10 DIAGNOSIS — Z6830 Body mass index (BMI) 30.0-30.9, adult: Secondary | ICD-10-CM | POA: Diagnosis not present

## 2023-04-10 DIAGNOSIS — R079 Chest pain, unspecified: Secondary | ICD-10-CM | POA: Diagnosis not present

## 2023-04-10 DIAGNOSIS — I10 Essential (primary) hypertension: Secondary | ICD-10-CM | POA: Insufficient documentation

## 2023-04-10 DIAGNOSIS — E785 Hyperlipidemia, unspecified: Secondary | ICD-10-CM | POA: Diagnosis not present

## 2023-04-10 NOTE — Progress Notes (Signed)
Cardiology Office Note:    Date:  04/10/2023   ID:  AMAHYA GEITZ, DOB 12-13-1945, MRN [id number]  PCP:  Jarrett Soho, PA-C  Cardiologist:  None  Electrophysiologist:  None   Referring MD: Jarrett Soho, PA-C   Chief Complaint  Patient presents with   Chest Pain     History of Present Illness:    ANNEISHA MUNZER is a 77 y.o. female with a hx of breast cancer, esophageal stricture who presents for follow-up. She was referred by Dr. Clarene Duke for evaluation of chest pain, initially seen on 10/13/2019. She reports that she has been having intermittent chest pain.  Reports that it occurs every day, can last up to 30 minutes.  Can occur with exertion, but she states she has not been exercising due to back pain.  States that over the last 2 weeks she has tried to walk on the treadmill 1-2 times, but does walk for less than 10 minutes.  Felt short of breath when she tried to do this.  States that she feels her shortness of breath has worsened over the last 6 weeks.  Walking up hills causes her to be short of breath.  Reports BP has been elevated up to 150s at home.  No smoking history.  Family history includes father had MI in 22s.  Lexiscan Myoview on 10/21/2019 showed normal perfusion, EF 68%. Echocardiogram on 11/11/2019 showed LVEF 65 to 70%, moderate LVH, grade 1 diastolic dysfunction, normal RV function, mild AI. Calcium score on 11/11/2019 was 57 (54th percentile); also notable for small pulmonary nodules.  Since last clinic visit, she reports she is doing well.  Denies any chest pain, dyspnea, lower extremity edema, or palpitations.  Reports some lightheadedness but no syncope.  She joined [employer], was going twice per week but however last month has not been going.  Weight is up 10 pounds from last year.  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%   Cataract    REMOVED 05-2020 AND 06-2020 BILAT   Colon polyps    Hyperplastic    Diverticulosis of colon (without mention of hemorrhage)    Esophageal stricture    GERD (gastroesophageal reflux disease)    History of radiation therapy    History of urinary tract infection    Hypertension    CONTROLLED- OFF MEDS -   Lower back pain    Neuromuscular disorder (HCC)    peripheral nueropathy   Numbness    feet bilat at nighttime   Osteopenia    Personal history of radiation therapy 2006   Restless leg syndrome 03/27/2014   Tremor, 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   Head and neck tremor   Trimalleolar fracture of left ankle 08/24/2015   Urinary urgency     Past Surgical History:  Procedure Laterality Date   BREAST BIOPSY Left 01/2021   BREAST LUMPECTOMY Left 2006   BREAST LUMPECTOMY Left 02/12/2021   BREAST LUMPECTOMY WITH RADIOACTIVE SEED LOCALIZATION Left 02/12/2021   Procedure: LEFT BREAST LUMPECTOMY WITH RADIOACTIVE SEED LOCALIZATION;  Surgeon: Harriette Bouillon, MD;  Location: [facility];  Service: General;  Laterality: Left;   BREAST SURGERY     left 2006  CESAREAN SECTION     COLONOSCOPY     EYE SURGERY  2022   FRACTURE SURGERY  2017   ORIF ANKLE FRACTURE Left 08/24/2015   Procedure: OPEN REDUCTION INTERNAL FIXATION (ORIF) LEFT ANKLE FRACTURE;  Surgeon: Kathryne Hitch, MD;  Location: [facility];  Service: Orthopedics;  Laterality: Left;  General with a popliteal block   POLYPECTOMY     HPP 2011    Current Medications: Current Meds  Medication Sig   b complex vitamins capsule Take 1 capsule by mouth daily.   Cholecalciferol (VITAMIN D3) 2000 units TABS Take 2,000 Units by mouth daily.    clidinium-chlordiazePOXIDE (LIBRAX) 5-2.5 MG capsule Take 1 capsule by mouth as needed.   Coenzyme Q10 (CO Q-10) 100 MG CAPS Take 200 mg by mouth daily.   Cranberry-Vitamin  C-Probiotic (AZO CRANBERRY) 250-30 MG TABS Take 1 tablet by mouth 2 (two) times daily.    fluticasone (FLONASE) 50 MCG/ACT nasal spray Place 1 spray into both nostrils daily as needed for allergies or rhinitis.   Glucosamine HCl (GLUCOSAMINE PO) Take by mouth 2 (two) times daily. 1500/1200 mg   Iron, Ferrous Sulfate, 325 (65 Fe) MG TABS Take 1 tablet by mouth daily.   MAGNESIUM PO Take 240 mg by mouth daily.   Multiple Vitamin (MULTIVITAMIN) tablet Take 1 tablet by mouth every evening.    NON FORMULARY Citrical plus d 2 a day   Omega-3 Fatty Acids (FISH OIL) 1200 MG CAPS Take 1,200 mg by mouth daily.    pantoprazole (PROTONIX) 40 MG tablet Take 1 tablet (40 mg total) by mouth daily.   pregabalin (LYRICA) 50 MG capsule Take up to 2 tablets at bedtime for restless leg symptoms   rosuvastatin (CRESTOR) 10 MG tablet TAKE 1 TABLET DAILY   TURMERIC PO Take 1 capsule by mouth 2 (two) times a day. 1000 mg     Allergies:   Lisinopril, Amlodipine, Anastrozole, Flagyl [metronidazole], Nsaids, and Tetracycline hcl   Social History   Socioeconomic History   Marital status: Married    Spouse name: Not on file   Number of children: 2   Years of education: AS   Highest education level: Not on file  Occupational History   Occupation: Teacher, adult education: [employer]   Occupation: Retired  Tobacco Use   Smoking status: Never   Smokeless tobacco: Never  Vaping Use   Vaping status: Never Used  Substance and Sexual Activity   Alcohol use: Yes    Comment: rare occ drink wine   Drug use: No   Sexual activity: Yes  Other Topics Concern   Not on file  Social History Narrative   Lives at home w/ her husband   Right-handed   Occasionally drinks tea   Social Determinants of Health   Financial Resource Strain: Low Risk  (05/27/2021)   Overall Financial Resource Strain (CARDIA)    Difficulty of Paying Living Expenses: Not very hard  Food Insecurity: No Food Insecurity (05/27/2021)   Hunger  Vital Sign    Worried About Running Out of Food in the Last Year: Never true    Ran Out of Food in the Last Year: Never true  Transportation Needs: No Transportation Needs (05/27/2021)   PRAPARE - Administrator, Civil Service (Medical): No    Lack of Transportation (Non-Medical): No  Physical Activity: Sufficiently Active (05/27/2021)   Exercise Vital Sign    Days of Exercise per Week: 3 days    Minutes  of Exercise per Session: 90 min  Stress: Not on file  Social Connections: Socially Integrated (05/27/2021)   Social Connection and Isolation Panel [NHANES]    Frequency of Communication with Friends and Family: More than three times a week    Frequency of Social Gatherings with Friends and Family: Three times a week    Attends Religious Services: More than 4 times per year    Active Member of Clubs or Organizations: Yes    Attends Banker Meetings: 1 to 4 times per year    Marital Status: Married     Family History:  family history includes Alzheimer's disease in her maternal aunt; Breast cancer in her maternal aunt and maternal aunt; Breast cancer (age of onset: 34) in her sister; Breast cancer (age of onset: 38) in her cousin; COPD in her father; Cancer in her maternal aunt, maternal aunt, and sister; Congestive Heart Failure in her maternal grandmother; Emphysema in her father; Esophageal cancer in her cousin; Heart attack in her mother; Heart disease in her father, maternal grandmother, and paternal grandmother; Obesity in her brother; Ovarian cancer in her cousin; Squamous cell carcinoma in her maternal aunt; Varicose Veins in her mother. There is no history of Neuropathy, Thyroid disease, Colon cancer, Colon polyps, Rectal cancer, or Stomach cancer.  ROS:   Please see the history of present illness.    All other systems reviewed and are negative.  EKGs/Labs/Other Studies Reviewed:    The following studies were reviewed today:   EKG:   03/18/21: Normal  sinus rhythm, rate 78, low voltage, poor R wave progression 04/15/22: Normal sinus rhythm, rate 77, poor R wave progression 04/10/23: Normal sinus rhythm, rate 76, poor R wave progression, Q waves in leads III, aVF   Recent Labs: No results found for requested labs within last 365 days.  Recent Lipid     Component Value Date/Time   CHOL 119 01/13/2020 0812   TRIG 117 01/13/2020 0812   HDL 49 01/13/2020 0812   CHOLHDL 2.4 01/13/2020 0812   LDLCALC 49 01/13/2020 0812    Physical Exam:    VS:  BP 120/84 (BP Location: Right Arm, Patient Position: Sitting, Cuff Size: Normal)   Pulse 83   Ht 5\' 4"  (1.626 m)   Wt 179 lb 9.6 oz (81.5 kg)   SpO2 95%   BMI 30.83 kg/m     Wt Readings from Last 3 Encounters:  04/10/23 179 lb 9.6 oz (81.5 kg)  01/14/23 178 lb (80.7 kg)  11/14/22 177 lb 12.8 oz (80.6 kg)     GEN: in no acute distress HEENT: Normal NECK: No JVD; No carotid bruits CARDIAC: RRR, no murmurs, rubs, gallops RESPIRATORY:  Clear to auscultation without rales, wheezing or rhonchi  ABDOMEN: Soft, non-tender, non-distended MUSCULOSKELETAL:  No edema; No deformity  SKIN: Warm and dry NEUROLOGIC:  Alert and oriented x 3 PSYCHIATRIC:  Normal affect   ASSESSMENT:    1. Essential hypertension   2. Chest pain of uncertain etiology   3. Hyperlipidemia, unspecified hyperlipidemia type   4. Class 1 obesity without serious comorbidity with body mass index (BMI) of 30.0 to 30.9 in adult, unspecified obesity type       PLAN:    Chest pain/dyspnea on exertion: Chest pain is atypical in description.  Lexiscan Myoview on 10/21/2019 showed normal perfusion, EF 68%. Echocardiogram on 11/11/2019 showed LVEF 65 to 70%, moderate LVH, grade 1 diastolic dysfunction, normal RV function, mild AI. Calcium score on 11/11/2019 was 57 (  54th percentile).  Symptoms have improved, no further cardiac work-up recommended.  Hypertension: Previously on lisinopril and amlodipine, but did not tolerate.   Currently off antihypertensives, and BP appears controlled.  Continue to monitor  Hyperlipidemia: LDL 61 on 12/04/21, continue rosuvastatin 10 mg daily.    Pulmonary nodules: CT calcium score 11/2019 showed small pulmonary nodules. Likely benign but given her breast cancer history, repeat chest CT was done on 04/05/2021 which showed unchanged small pulmonary nodules  Obesity: Body mass index is 30.83 kg/m.  Weight up 10 pounds over the last year.  Diet/exercise encouraged  RTC in 1 year   Medication Adjustments/Labs and Tests Ordered: Current medicines are reviewed at length with the patient today.  Concerns regarding medicines are outlined above.  Orders Placed This Encounter  Procedures   EKG 12-Lead    No orders of the defined types were placed in this encounter.    Patient Instructions  Medication Instructions:  Continue current medications *If you need a refill on your cardiac medications before your next appointment, please call your pharmacy*   Lab Work: none If you have labs (blood work) drawn today and your tests are completely normal, you will receive your results only by: MyChart Message (if you have MyChart) OR A paper copy in the mail If you have any lab test that is abnormal or we need to change your treatment, we will call you to review the results.   Testing/Procedures: none   Follow-Up: At [facility], you and your health needs are our priority.  As part of our continuing mission to provide you with exceptional heart care, we have created designated Provider Care Teams.  These Care Teams include your primary Cardiologist (physician) and Advanced Practice Providers (APPs -  Physician Assistants and Nurse Practitioners) who all work together to provide you with the care you need, when you need it.  We recommend signing up for the patient portal called "MyChart".  Sign up information is provided on this After Visit Summary.  MyChart is used to connect  with patients for Virtual Visits (Telemedicine).  Patients are able to view lab/test results, encounter notes, upcoming appointments, etc.  Non-urgent messages can be sent to your provider as well.   To learn more about what you can do with MyChart, go to [email or web address].    Your next appointment:   1 year(s)  Provider:   Dr.Kourtney Terriquez  Other Instructions none    Signed, Little Ishikawa, MD  04/10/2023 8:55 AM    [facility]

## 2023-04-10 NOTE — Patient Instructions (Signed)
 Medication Instructions:  Continue current medications *If you need a refill on your cardiac medications before your next appointment, please call your pharmacy*   Lab Work: none If you have labs (blood work) drawn today and your tests are completely normal, you will receive your results only by: MyChart Message (if you have MyChart) OR A paper copy in the mail If you have any lab test that is abnormal or we need to change your treatment, we will call you to review the results.   Testing/Procedures: none   Follow-Up: At [facility], you and your health needs are our priority.  As part of our continuing mission to provide you with exceptional heart care, we have created designated Provider Care Teams.  These Care Teams include your primary Cardiologist (physician) and Advanced Practice Providers (APPs -  Physician Assistants and Nurse Practitioners) who all work together to provide you with the care you need, when you need it.  We recommend signing up for the patient portal called "MyChart".  Sign up information is provided on this After Visit Summary.  MyChart is used to connect with patients for Virtual Visits (Telemedicine).  Patients are able to view lab/test results, encounter notes, upcoming appointments, etc.  Non-urgent messages can be sent to your provider as well.   To learn more about what you can do with MyChart, go to [email or web address].    Your next appointment:   1 year(s)  Provider:   Dr. Bjorn Pippin   Other Instructions none

## 2023-05-15 DIAGNOSIS — Z961 Presence of intraocular lens: Secondary | ICD-10-CM | POA: Diagnosis not present

## 2023-05-15 DIAGNOSIS — H5211 Myopia, right eye: Secondary | ICD-10-CM | POA: Diagnosis not present

## 2023-07-09 DIAGNOSIS — L03116 Cellulitis of left lower limb: Secondary | ICD-10-CM | POA: Diagnosis not present

## 2023-07-12 ENCOUNTER — Other Ambulatory Visit: Payer: Self-pay | Admitting: Neurology

## 2023-07-13 NOTE — Telephone Encounter (Signed)
 Last seen 01/14/23, next appt 08/06/23  Dispenses   Dispensed Days Supply Quantity Provider Pharmacy  PREGABALIN 50 MG CAPSULE 04/20/2023 90 180 capsule Glean Salvo, NP [facility]...  PREGABALIN 50 MG CAPSULE 01/14/2023 90 180 capsule Glean Salvo, NP [facility]...  PREGABALIN 50 MG CAPSULE 10/20/2022 90 180 capsule Glean Salvo, NP [facility]...  PREGABALIN 50 MG CAPSULE 07/21/2022 90 180 capsule Glean Salvo, NP [facility].Marland KitchenMarland Kitchen

## 2023-07-14 DIAGNOSIS — D0512 Intraductal carcinoma in situ of left breast: Secondary | ICD-10-CM | POA: Diagnosis not present

## 2023-07-16 ENCOUNTER — Ambulatory Visit (INDEPENDENT_AMBULATORY_CARE_PROVIDER_SITE_OTHER): Payer: Medicare Other | Admitting: Internal Medicine

## 2023-07-16 ENCOUNTER — Encounter: Payer: Self-pay | Admitting: Internal Medicine

## 2023-07-16 VITALS — BP 110/70 | HR 87 | Ht 64.0 in | Wt 184.0 lb

## 2023-07-16 DIAGNOSIS — K589 Irritable bowel syndrome without diarrhea: Secondary | ICD-10-CM

## 2023-07-16 DIAGNOSIS — K219 Gastro-esophageal reflux disease without esophagitis: Secondary | ICD-10-CM

## 2023-07-16 MED ORDER — CILIDINIUM-CHLORDIAZEPOXIDE 2.5-5 MG PO CAPS: 1.0000 | ORAL_CAPSULE | ORAL | 3 refills | Status: AC | PRN

## 2023-07-16 NOTE — Progress Notes (Signed)
 HISTORY OF PRESENT ILLNESS:  Meghan [name] is a 78 y.o. female, retired cardiology nurse, who presents today regarding ongoing management of chronic GERD and occasional IBS.  Last seen January 27, 2022.  See that dictation.  Since that time she has been doing well.  She continues on pantoprazole 40 mg daily for GERD.  No breakthrough symptoms.  No dysphagia.  For her IBS as manifested by occasional abdominal cramping she takes Librax on demand.  This helps.  Medication refills requested.  Otherwise she has been doing well.  I was sorry to hear that her husband with Alzheimer's.  This has created understandable stress.  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%   Cataract    REMOVED 05-2020 AND 06-2020 BILAT   Colon polyps    Hyperplastic    Diverticulosis of colon (without mention of hemorrhage)    Esophageal stricture    GERD (gastroesophageal reflux disease)    History of radiation therapy    History of urinary tract infection    Hypertension    CONTROLLED- OFF MEDS -   Lower back pain    Neuromuscular disorder (HCC)    peripheral nueropathy   Numbness    feet bilat at nighttime   Osteopenia    Personal history of radiation therapy 2006   Restless leg syndrome 03/27/2014   Tremor, 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   Head and neck tremor   Trimalleolar fracture of left ankle 08/24/2015   Urinary urgency     Past Surgical History:  Procedure Laterality Date   BREAST BIOPSY Left 01/2021   BREAST LUMPECTOMY Left 2006   BREAST LUMPECTOMY Left 02/12/2021   BREAST LUMPECTOMY WITH RADIOACTIVE SEED LOCALIZATION Left 02/12/2021   Procedure: LEFT BREAST LUMPECTOMY WITH RADIOACTIVE SEED LOCALIZATION;  Surgeon: Harriette Bouillon, MD;  Location: [facility];  Service: General;   Laterality: Left;   BREAST SURGERY     left 2006   CESAREAN SECTION     COLONOSCOPY     EYE SURGERY  2022   FRACTURE SURGERY  2017   ORIF ANKLE FRACTURE Left 08/24/2015   Procedure: OPEN REDUCTION INTERNAL FIXATION (ORIF) LEFT ANKLE FRACTURE;  Surgeon: Kathryne Hitch, MD;  Location: [facility];  Service: Orthopedics;  Laterality: Left;  General with a popliteal block   POLYPECTOMY     HPP 2011    Social History [name]  reports that she has never smoked. She has never used smokeless tobacco. She reports current alcohol use. She reports that she does not use drugs.  family history includes Alzheimer's disease in her maternal aunt; Breast cancer in her maternal aunt and maternal aunt; Breast cancer (age of onset: 73) in her sister; Breast cancer (age of onset: 61) in her cousin; COPD in her father; Cancer in her maternal aunt, maternal aunt, and sister; Congestive Heart Failure in her maternal grandmother; Emphysema in her father; Esophageal cancer in her cousin; Heart attack in her mother; Heart disease in her father, maternal grandmother, and paternal grandmother; Obesity in her brother; Ovarian cancer in her cousin; Squamous cell carcinoma in her maternal aunt; Varicose Veins in her mother.  Allergies  Allergen Reactions   Lisinopril Swelling    SOB & swelling    Amlodipine Swelling  Swelling face   Anastrozole Hives and Other (See Comments)   Flagyl [Metronidazole] Nausea Only   Nsaids Other (See Comments)    CKD - per PCP Other reaction(s): Other (See Comments) CKD per PCP   Tetracycline Hcl Other (See Comments)       PHYSICAL EXAMINATION: Vital signs: BP 110/70   Pulse 87   Ht 5\' 4"  (1.626 m)   Wt 184 lb (83.5 kg)   BMI 31.58 kg/m   Constitutional: generally well-appearing, no acute distress Psychiatric: alert and oriented x3, cooperative Eyes: extraocular movements intact, anicteric, conjunctiva pink Mouth: oral pharynx moist, no lesions Neck: supple  no lymphadenopathy Cardiovascular: heart regular rate and rhythm, no murmur Lungs: clear to auscultation bilaterally Abdomen: soft, nontender, nondistended, no obvious ascites, no peritoneal signs, normal bowel sounds, no organomegaly Rectal: Omitted Extremities: no clubbing, cyanosis, or lower extremity edema bilaterally Skin: no lesions on visible extremities Neuro: No focal deficits.  Cranial nerves intact  ASSESSMENT:  1.  GERD.  Symptoms controlled with once daily PPI 2.  Previous colonoscopy 2011 and 2022 were both negative for neoplasia.  Aged out of screening. 3.  IBS managed with on-demand Librax   PLAN:  1.  Reflux precautions 2.  Refill pantoprazole 40 mg daily.  Medication risks reviewed 3.  Refill Librax.  Medication risks reviewed 4.  Routine office follow-up 1 to 2 years.

## 2023-07-16 NOTE — Patient Instructions (Signed)
 We have sent the following medications to your pharmacy for you to pick up at your convenience: Librax  Please follow up in one year.  _______________________________________________________  If your blood pressure at your visit was 140/90 or greater, please contact your primary care physician to follow up on this.  _______________________________________________________  If you are age 78 or older, your body mass index should be between 23-30. Your Body mass index is 31.58 kg/m. If this is out of the aforementioned range listed, please consider follow up with your Primary Care Provider.  If you are age 62 or younger, your body mass index should be between 19-25. Your Body mass index is 31.58 kg/m. If this is out of the aformentioned range listed, please consider follow up with your Primary Care Provider.   ________________________________________________________  The [facility] providers would like to encourage you to use [facility] to communicate with providers for non-urgent requests or questions.  Due to long hold times on the telephone, sending your provider a message by [facility] may be a faster and more efficient way to get a response.  Please allow 48 business hours for a response.  Please remember that this is for non-urgent requests.  _______________________________________________________

## 2023-08-04 ENCOUNTER — Other Ambulatory Visit

## 2023-08-04 DIAGNOSIS — Z006 Encounter for examination for normal comparison and control in clinical research program: Secondary | ICD-10-CM

## 2023-08-05 NOTE — Progress Notes (Unsigned)
 Patient: Meghan [name] Date of Birth: July 28, 1945  Reason for Visit: Follow up for RLS History from: Patient Primary Neurologist: Meghan [name]/Meghan [name]   ASSESSMENT AND PLAN 78 y.o. year old female   1.  Restless leg syndrome 2.  Polyneuropathy  -Overall, RLS symptoms under very good control, will continue Lyrica 50 mg, 2 tablets at bedtime -Gait instability potentially from polyneuropathy or left foot surgery from injury in 2019 requiring metal hardware. Discussed PT, she wants to monitor for now. -Ferritin level was 43 August 2023 (if < 75, may exacerbate RLS sx), only oral iron twice weekly, PCP can follow iron panels -Tried and failed: Mirapex, gabapentin -Follow-up in 6 months or sooner if needed  No orders of the defined types were placed in this encounter.  HISTORY OF PRESENT ILLNESS: Today 08/05/23   01/14/23 SS: Remains on Lyrica 50 mg, 2 capsules at bedtime. Mostly under good control. Only 3 times since Feb she has had to get up and walk around at night, take Tylenol for RLS. Is on Iron twice weekly. Works part-time for [name]. At night her feet burn and tingle, magnesium has helped. With walking some gait instability, no headache, dizziness. No falls.   07/02/22 SS: Takes Lyrica 50 mg, 2 capsules at bedtime, 1-2 nights a week RLS symptoms are still bothersome, may take Extra Strength Tylenol or has occasionally taken extra Lyrica. Likely RLS  symptoms related to her mind racing, stress at work. Sometimes in the morning feels off balance first thing. Likes Lyrica better than gabapentin. She did take some Iron but it gave her diarrhea, it did help her RLS.  Started multivitamin with some iron.  Update 12/24/21 SS: Meghan [name] is here today for follow-up.  Has been seen for RLS, is on Mirapex and gabapentin. 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 Husband has poor memory. She works part time [name]. She stopped the Mirapex last October due to feeling groggy in  the AM, off balance. RLS symptoms have worsened, most nights has to take Tylenol to get to sleep. Still unsteady at times, often related to chronic ankle injury to left. No falls. RLS symptoms bother her 5/7 nights a week. Doesn't bother her during the day. Recurrent breast cancer last October had lumpectomy. Not on any chemo agents, mammogram next month.  On B12 supplement.  Intermittent head and neck tremor exacerbated by anxiety.  Also on magnesium.  HISTORY  12/21/20 Dr. Anne [name]: Meghan [name] is a 78 year old right-handed white female with a history of restless leg syndrome dating back over a years.  The patient returns to the office today with some new issues.  She has reported over the last 2 or 3 months that she has noted some sensation of imbalance.  She notes that when she closes her eyes that she has difficulty maintaining posture.  She recently fell off of a stepladder, possibly related to this imbalance issue.  She notes that if she touches the wall or chair, this improves the sensation of imbalance.  She does report some numbness in the toes of the feet at night with some stinging sensations.  She does have low back pain without radiation down the lower extremities.  She will occasionally get injections in the back with some benefit.  She has been told that she has spondylolisthesis.  The patient denies any trouble controlling the bowels or the bladder.  She denies any numbness or sensory alteration in the hands.  She denies neck pain.  She has developed  a mild tremor that is side to side involving the head and neck, no tremors of the arms are noted.  She denies any family history of diabetes.  REVIEW OF SYSTEMS: Out of a complete 14 system review of symptoms, the patient complains only of the following symptoms, and all other reviewed systems are negative.  See HPI  ALLERGIES: Allergies  Allergen Reactions   Lisinopril Swelling    SOB & swelling    Amlodipine Swelling    Swelling face    Anastrozole Hives and Other (See Comments)   Flagyl [Metronidazole] Nausea Only   Nsaids Other (See Comments)    CKD - per PCP Other reaction(s): Other (See Comments) CKD per PCP   Tetracycline Hcl Other (See Comments)    HOME MEDICATIONS: Outpatient Medications Prior to Visit  Medication Sig Dispense Refill   b complex vitamins capsule Take 1 capsule by mouth daily.     Cholecalciferol (VITAMIN D3) 2000 units TABS Take 2,000 Units by mouth daily.      clidinium-chlordiazePOXIDE (LIBRAX) 5-2.5 MG capsule Take 1 capsule by mouth as needed. 30 capsule 3   Coenzyme Q10 (CO Q-10) 100 MG CAPS Take 200 mg by mouth daily.     Cranberry-Vitamin C-Probiotic (AZO CRANBERRY) 250-30 MG TABS Take 1 tablet by mouth 2 (two) times daily.      fluticasone (FLONASE) 50 MCG/ACT nasal spray Place 1 spray into both nostrils daily as needed for allergies or rhinitis.     Glucosamine HCl (GLUCOSAMINE PO) Take by mouth 2 (two) times daily. 1500/1200 mg     Iron, Ferrous Sulfate, 325 (65 Fe) MG TABS Take 1 tablet by mouth daily.     MAGNESIUM PO Take 240 mg by mouth daily.     Multiple Vitamin (MULTIVITAMIN) tablet Take 1 tablet by mouth every evening.      NON FORMULARY Citrical plus d 2 a day     Omega-3 Fatty Acids (FISH OIL) 1200 MG CAPS Take 1,200 mg by mouth daily.      pantoprazole (PROTONIX) 40 MG tablet Take 1 tablet (40 mg total) by mouth daily. 90 tablet 3   pregabalin (LYRICA) 50 MG capsule TAKE UP TO 2 CAPSULES AT BEDTIME FOR RESTLESS LEG SYMPTOMS 180 capsule 1   rosuvastatin (CRESTOR) 10 MG tablet TAKE 1 TABLET DAILY 90 tablet 3   TURMERIC PO Take 1 capsule by mouth 2 (two) times a day. 1000 mg     No facility-administered medications prior to visit.    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%   Cataract    REMOVED 05-2020 AND 06-2020 BILAT   Colon polyps     Hyperplastic    Diverticulosis of colon (without mention of hemorrhage)    Esophageal stricture    GERD (gastroesophageal reflux disease)    History of radiation therapy    History of urinary tract infection    Hypertension    CONTROLLED- OFF MEDS -   Lower back pain    Neuromuscular disorder (HCC)    peripheral nueropathy   Numbness    feet bilat at nighttime   Osteopenia    Personal history of radiation therapy 2006   Restless leg syndrome 03/27/2014   Tremor, 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   Head and neck tremor   Trimalleolar fracture of left ankle 08/24/2015   Urinary urgency  PAST SURGICAL HISTORY: Past Surgical History:  Procedure Laterality Date   BREAST BIOPSY Left 01/2021   BREAST LUMPECTOMY Left 2006   BREAST LUMPECTOMY Left 02/12/2021   BREAST LUMPECTOMY WITH RADIOACTIVE SEED LOCALIZATION Left 02/12/2021   Procedure: LEFT BREAST LUMPECTOMY WITH RADIOACTIVE SEED LOCALIZATION;  Surgeon: Harriette Bouillon, MD;  Location: [facility];  Service: General;  Laterality: Left;   BREAST SURGERY     left 2006   CESAREAN SECTION     COLONOSCOPY     EYE SURGERY  2022   FRACTURE SURGERY  2017   ORIF ANKLE FRACTURE Left 08/24/2015   Procedure: OPEN REDUCTION INTERNAL FIXATION (ORIF) LEFT ANKLE FRACTURE;  Surgeon: Kathryne Hitch, MD;  Location: [facility];  Service: Orthopedics;  Laterality: Left;  General with a popliteal block   POLYPECTOMY     HPP 2011    FAMILY HISTORY: Family History  Problem Relation Age of Onset   Heart attack Mother    Varicose Veins Mother    COPD Father    Heart disease Father    Emphysema Father        smoker   Breast cancer Sister 60       mastectomy; metastasis to liver and lung   Cancer Sister    Obesity Brother    Breast cancer Maternal Aunt        dx. late 75s   Cancer Maternal Aunt    Alzheimer's disease Maternal Aunt    Breast cancer Maternal Aunt        dx. 70s   Squamous cell carcinoma Maternal Aunt        recently  removed; dx. 95   Cancer Maternal Aunt    Heart disease Maternal Grandmother    Congestive Heart Failure Maternal Grandmother    Heart disease Paternal Grandmother    Breast cancer Cousin 65       genetic testing in approx 2015   Ovarian cancer Cousin        dx. 50s   Esophageal cancer Cousin        not a smoker   Neuropathy Neg Hx    Thyroid disease Neg Hx    Colon cancer Neg Hx    Colon polyps Neg Hx    Rectal cancer Neg Hx    Stomach cancer Neg Hx     SOCIAL HISTORY: Social History   Socioeconomic History   Marital status: Married    Spouse name: Not on file   Number of children: 2   Years of education: AS   Highest education level: Not on file  Occupational History   Occupation: Teacher, adult education: [employer]   Occupation: Retired  Tobacco Use   Smoking status: Never   Smokeless tobacco: Never  Vaping Use   Vaping status: Never Used  Substance and Sexual Activity   Alcohol use: Yes    Comment: rare occ drink wine   Drug use: No   Sexual activity: Yes  Other Topics Concern   Not on file  Social History Narrative   Lives at home w/ her husband   Right-handed   Occasionally drinks tea   Social Drivers of Corporate investment banker Strain: Low Risk  (05/27/2021)   Overall Financial Resource Strain (CARDIA)    Difficulty of Paying Living Expenses: Not very hard  Food Insecurity: No Food Insecurity (05/27/2021)   Hunger Vital Sign    Worried About Running Out of Food in the Last Year: Never  true    Ran Out of Food in the Last Year: Never true  Transportation Needs: No Transportation Needs (05/27/2021)   PRAPARE - Administrator, Civil Service (Medical): No    Lack of Transportation (Non-Medical): No  Physical Activity: Sufficiently Active (05/27/2021)   Exercise Vital Sign    Days of Exercise per Week: 3 days    Minutes of Exercise per Session: 90 min  Stress: Not on file  Social Connections: Socially Integrated (05/27/2021)   Social  Connection and Isolation Panel [NHANES]    Frequency of Communication with Friends and Family: More than three times a week    Frequency of Social Gatherings with Friends and Family: Three times a week    Attends Religious Services: More than 4 times per year    Active Member of Clubs or Organizations: Yes    Attends Banker Meetings: 1 to 4 times per year    Marital Status: Married  Catering manager Violence: Not At Risk (05/27/2021)   Humiliation, Afraid, Rape, and Kick questionnaire    Fear of Current or Ex-Partner: No    Emotionally Abused: No    Physically Abused: No    Sexually Abused: No   PHYSICAL EXAM  There were no vitals filed for this visit.   There is no height or weight on file to calculate BMI.  Generalized: Well developed, in no acute distress  Neurological examination  Mentation: Alert oriented to time, place, history taking. Follows all commands speech and language fluent Cranial nerve II-XII: Pupils were equal round reactive to light. Extraocular movements were full, visual field were full on confrontational test. Facial sensation and strength were normal. Head turning and shoulder shrug  were normal and symmetric.  Intermittent side to side head tremor mild noted. Motor: The motor testing reveals 5 over 5 strength of all 4 extremities. Good symmetric motor tone is noted throughout.  Sensory: Sensory testing is intact to soft touch on all 4 extremities. No evidence of extinction is noted.  Coordination: Cerebellar testing reveals good finger-nose-finger and heel-to-shin bilaterally.  Gait and station: Gait is slightly wide-based, independent, tandem gait is slightly unsteady. Reflexes: Deep tendon reflexes are symmetric but are decreased  DIAGNOSTIC DATA (LABS, IMAGING, TESTING) - I reviewed patient records, labs, notes, testing and imaging myself where available.  Lab Results  Component Value Date   WBC 4.3 12/24/2021   HGB 14.0 12/24/2021   HCT  41.8 12/24/2021   MCV 94 12/24/2021   PLT 197 12/24/2021      Component Value Date/Time   NA 141 01/23/2021 0816   NA 140 11/09/2019 0945   K 3.9 01/23/2021 0816   CL 108 01/23/2021 0816   CO2 25 01/23/2021 0816   GLUCOSE 124 (H) 01/23/2021 0816   BUN 16 01/23/2021 0816   BUN 14 11/09/2019 0945   CREATININE 0.96 01/23/2021 0816   CALCIUM 10.2 01/23/2021 0816   PROT 7.1 01/23/2021 0816   PROT 6.7 12/21/2020 0824   ALBUMIN 3.6 01/23/2021 0816   AST 24 01/23/2021 0816   ALT 19 01/23/2021 0816   ALKPHOS 60 01/23/2021 0816   BILITOT 0.6 01/23/2021 0816   GFRNONAA >60 01/23/2021 0816   GFRAA 69 11/09/2019 0945   Lab Results  Component Value Date   CHOL 119 01/13/2020   HDL 49 01/13/2020   LDLCALC 49 01/13/2020   TRIG 117 01/13/2020   CHOLHDL 2.4 01/13/2020   No results found for: "HGBA1C" Lab Results  Component Value  Date   VITAMINB12 463 12/21/2020   No results found for: "TSH"  Margie Ege, AGNP-C, DNP 08/05/2023, 4:16 PM [facility] [address], [address] [place], [place] [address] [phone]

## 2023-08-06 ENCOUNTER — Encounter: Payer: Self-pay | Admitting: Neurology

## 2023-08-06 ENCOUNTER — Ambulatory Visit: Payer: Medicare Other | Admitting: Neurology

## 2023-08-06 VITALS — BP 135/71 | HR 62 | Ht 64.0 in | Wt 185.5 lb

## 2023-08-06 DIAGNOSIS — G2581 Restless legs syndrome: Secondary | ICD-10-CM | POA: Diagnosis not present

## 2023-08-06 NOTE — Patient Instructions (Addendum)
 Great to see you today.  Will continue Lyrica at current dosing.  If your RLS symptoms increase please let me know.  I will see you back in 6 months.  Thanks!!

## 2023-08-18 LAB — GENECONNECT MOLECULAR SCREEN: Genetic Analysis Overall Interpretation: NEGATIVE

## 2023-10-16 ENCOUNTER — Other Ambulatory Visit: Payer: Self-pay | Admitting: Cardiology

## 2023-11-10 ENCOUNTER — Telehealth: Payer: Self-pay | Admitting: Adult Health

## 2023-11-10 NOTE — Telephone Encounter (Signed)
 Called to reschedule patient appointment to due provider pal  request. I talked  to patient and they are aware of the changes that was made to the upcoming appointment

## 2023-11-16 ENCOUNTER — Encounter: Payer: Medicare Other | Admitting: Adult Health

## 2023-11-20 ENCOUNTER — Encounter: Payer: Medicare Other | Admitting: Adult Health

## 2023-12-02 ENCOUNTER — Encounter: Payer: Self-pay | Admitting: Cardiology

## 2023-12-02 ENCOUNTER — Encounter: Payer: Self-pay | Admitting: *Deleted

## 2023-12-04 ENCOUNTER — Encounter: Payer: Self-pay | Admitting: Adult Health

## 2023-12-04 ENCOUNTER — Inpatient Hospital Stay: Attending: Adult Health | Admitting: Adult Health

## 2023-12-04 ENCOUNTER — Other Ambulatory Visit: Payer: Self-pay | Admitting: Obstetrics and Gynecology

## 2023-12-04 DIAGNOSIS — Z853 Personal history of malignant neoplasm of breast: Secondary | ICD-10-CM | POA: Insufficient documentation

## 2023-12-04 DIAGNOSIS — Z9889 Other specified postprocedural states: Secondary | ICD-10-CM

## 2023-12-04 DIAGNOSIS — Z860100 Personal history of colon polyps, unspecified: Secondary | ICD-10-CM | POA: Insufficient documentation

## 2023-12-04 DIAGNOSIS — N644 Mastodynia: Secondary | ICD-10-CM | POA: Diagnosis not present

## 2023-12-04 DIAGNOSIS — Z803 Family history of malignant neoplasm of breast: Secondary | ICD-10-CM | POA: Diagnosis not present

## 2023-12-04 DIAGNOSIS — Z8041 Family history of malignant neoplasm of ovary: Secondary | ICD-10-CM | POA: Insufficient documentation

## 2023-12-04 DIAGNOSIS — D0512 Intraductal carcinoma in situ of left breast: Secondary | ICD-10-CM | POA: Diagnosis not present

## 2023-12-04 DIAGNOSIS — Z923 Personal history of irradiation: Secondary | ICD-10-CM | POA: Diagnosis not present

## 2023-12-04 NOTE — Progress Notes (Signed)
 [facility] Cancer Follow up:    Meghan Pfeiffer, PA-C [address] [place] [address]   DIAGNOSIS: Cancer Staging  Ductal carcinoma in situ (DCIS) of left breast Staging form: Breast, AJCC 8th Edition - Clinical stage from 01/23/2021: Stage 0 (cTis (DCIS), cN0, cM0, ER+, PR+, HER2: Not Assessed) - Signed by Layla Sandria BROCKS, MD on 01/23/2021 Stage prefix: Initial diagnosis Nuclear grade: G2 Laterality: Left Staged by: Pathologist and managing physician Stage used in treatment planning: Yes National guidelines used in treatment planning: Yes Type of national guideline used in treatment planning: NCCN - Pathologic stage from 02/12/2021: Stage 0 (pTis (DCIS), pN0, cM0, ER+, PR+) - Signed by Crawford Morna Pickle, NP on 05/25/2021 Stage prefix: Initial diagnosis    SUMMARY OF ONCOLOGIC HISTORY: Oncology History  Ductal carcinoma in situ (DCIS) of left breast  10/08/2004 Surgery   s/p left lumpectomy 10/08/2004 for a pT1b pN0, stage IA invasive ductal carcinoma, grade 2, estrogen and progesterone receptor positive, HER2 not amplified, with an MIB-1 of 17%             (A) status post adjuvant MammoSite radiation             (B) status post tamoxifen for 5 years   12/22/2014 Genetic Testing   Genes tested: ATM, BARD1, BRCA1, BRCA2, BRIP1, CDH1, CHEK2, FANCC, MLH1, MSH2, MSH6, NBN, PALB2, PMS2, PTEN, RAD51C, RAD51D, TP53, and XRCC2.  .  Genetic testing was normal, and did not reveal a deleterious mutation in these genes.  Additionally, no variants of uncertain significance (VUSs) were found   01/14/2021 Initial Biopsy   Diagnosis Breast, left, needle core biopsy, upper outer, X clip - DUCTAL CARCINOMA IN SITU, INTERMEDIATE GRADE WITH CALCIFICATIONS. SEE NOTE Diagnosis Note DCIS measures 0.4 cm in greatest linear dimension  PROGNOSTIC INDICATORS Results: Estrogen Receptor: 100%, POSITIVE, STRONG STAINING INTENSITY Progesterone Receptor: 30%, POSITIVE,  STRONG STAINING INTENSITY   01/23/2021 Cancer Staging   Staging form: Breast, AJCC 8th Edition - Clinical stage from 01/23/2021: Stage 0 (cTis (DCIS), cN0, cM0, ER+, PR+, HER2: Not Assessed) - Signed by Layla Sandria BROCKS, MD on 01/23/2021 Stage prefix: Initial diagnosis Nuclear grade: G2 Laterality: Left Staged by: Pathologist and managing physician Stage used in treatment planning: Yes National guidelines used in treatment planning: Yes Type of national guideline used in treatment planning: NCCN   02/12/2021 Cancer Staging   Staging form: Breast, AJCC 8th Edition - Pathologic stage from 02/12/2021: Stage 0 (pTis (DCIS), pN0, cM0, ER+, PR+) - Signed by Crawford Morna Pickle, NP on 05/25/2021 Stage prefix: Initial diagnosis   02/12/2021 Definitive Surgery   FINAL MICROSCOPIC DIAGNOSIS:   A. BREAST, LEFT, LUMPECTOMY:  - Focus of intermediate to high-grade ductal carcinoma in situ  - Negative for invasive carcinoma  - Resection margins are negative for DCIS  - Biopsy site changes  - See oncology table   B. BREAST, LEFT ADDITIONAL SUPERIOR MARGIN, EXCISION:  - Benign breast parenchyma with no specific histopathologic changes.  - Negative for carcinoma   C. BREAST, LEFT ADDITIONAL MEDIAL MARGIN, EXCISION:  - Benign breast parenchyma with no specific histopathologic changes.  - Negative for carcinoma   D. BREAST, LEFT ADDITIONAL INFERIOR MARGIN, EXCISION:  - Benign breast parenchyma with no specific histopathologic changes.  - Negative for carcinoma   E. BREAST, LEFT ADDITIONAL LATERAL MARGIN, EXCISION:  - Benign breast parenchyma with no specific histopathologic changes.  - Negative for carcinoma   F. BREAST, LEFT ADDITIONAL POSTERIOR MARGIN, EXCISION:  -  Benign breast parenchyma with no specific histopathologic changes.  - Negative for carcinoma    COMMENT:  A.  Focus of DCIS measures 0.2 cm in greatest dimension.    04/2021 - 04/2021 Anti-estrogen oral therapy    anastrozole  daily-unable to tolerate, caused dizziness     CURRENT THERAPY: observation  INTERVAL HISTORY:  Discussed the use of AI scribe software for clinical note transcription with the patient, who gave verbal consent to proceed.  History of Present Illness Meghan [name] is a 78 year old female with breast cancer who presents for routine follow-up.  Initially diagnosed with breast cancer in June 2006, she underwent a left lumpectomy, adjuvant radiation, and completed tamoxifen by 2011. In 2022, she had a recurrence with left breast DCIS, treated with another lumpectomy. She is currently on observation due to intolerance to anastrozole , which caused hives.  Her most recent mammogram on January 16, 2023, showed no evidence of malignancy and indicated breast density category B. She is scheduled for her next mammogram on January 19, 2024. She experiences occasional breast pain, likely due to scar tissue, with no other health changes this year.  She participates in exercise classes focused on strength and balance but has some difficulty with balance. She walks regularly, though not daily, and her diet includes some fruits and vegetables, with caution regarding processed foods.     Patient Active Problem List   Diagnosis Date Noted   Polyneuropathy 12/24/2021   Body mass index (BMI) 35.0-35.9, adult 05/25/2021   Chronic kidney disease, stage 3a (HCC) 05/25/2021   Impaired fasting glucose 05/25/2021   History of colonic polyps 05/25/2021   Osteopenia 05/25/2021   Multinodular goiter 05/25/2021   Ductal carcinoma in situ (DCIS) of left breast 01/21/2021   Tremor, 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   Essential (primary) hypertension 05/11/2020   Spondylolisthesis at L4-L5 level 08/15/2019   Solitary pulmonary nodule 10/12/2015   Ankle fracture, left 08/24/2015   Family history of breast cancer in female 12/28/2014   Family history of ovarian cancer 12/28/2014   Genetic testing 12/07/2014    Restless leg syndrome 03/27/2014   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  MEDICAL HISTORY: 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%   Cataract    REMOVED 05-2020 AND 06-2020 BILAT   Colon polyps    Hyperplastic    Diverticulosis of colon (without mention of hemorrhage)    Esophageal stricture    GERD (gastroesophageal reflux disease)    History of radiation therapy    History of urinary tract infection    Hypertension    CONTROLLED- OFF MEDS -   Lower back pain    Neuromuscular disorder (HCC)    peripheral nueropathy   Numbness    feet bilat at nighttime   Osteopenia    Personal history of radiation therapy 2006   Restless leg syndrome 03/27/2014   Tremor, 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   Head and neck tremor   Trimalleolar fracture of left ankle 08/24/2015   Urinary urgency     SURGICAL HISTORY: Past Surgical History:  Procedure Laterality Date   BREAST BIOPSY Left 01/2021   BREAST LUMPECTOMY Left 2006   BREAST LUMPECTOMY Left 02/12/2021   BREAST LUMPECTOMY WITH RADIOACTIVE SEED LOCALIZATION Left 02/12/2021  Procedure: LEFT BREAST LUMPECTOMY WITH RADIOACTIVE SEED LOCALIZATION;  Surgeon: Vanderbilt Ned, MD;  Location: [facility];  Service: General;  Laterality: Left;   BREAST SURGERY     left 2006   CESAREAN SECTION     COLONOSCOPY     EYE SURGERY  2022   FRACTURE SURGERY  2017   ORIF ANKLE FRACTURE Left 08/24/2015   Procedure: OPEN REDUCTION INTERNAL FIXATION (ORIF) LEFT ANKLE FRACTURE;  Surgeon: Lonni CINDERELLA Poli, MD;  Location: [facility];  Service: Orthopedics;  Laterality: Left;  General with a popliteal block   POLYPECTOMY     HPP 2011    SOCIAL HISTORY: Social History    Socioeconomic History   Marital status: Married    Spouse name: Not on file   Number of children: 2   Years of education: AS   Highest education level: Not on file  Occupational History   Occupation: Teacher, adult education: [employer]   Occupation: Retired  Tobacco Use   Smoking status: Never   Smokeless tobacco: Never  Vaping Use   Vaping status: Never Used  Substance and Sexual Activity   Alcohol use: Yes    Comment: rare occ drink wine   Drug use: No   Sexual activity: Yes  Other Topics Concern   Not on file  Social History Narrative   Lives at home w/ her husband   Right-handed   Occasionally drinks tea   Social Drivers of Corporate investment banker Strain: Low Risk  (05/27/2021)   Overall Financial Resource Strain (CARDIA)    Difficulty of Paying Living Expenses: Not very hard  Food Insecurity: No Food Insecurity (05/27/2021)   Hunger Vital Sign    Worried About Running Out of Food in the Last Year: Never true    Ran Out of Food in the Last Year: Never true  Transportation Needs: No Transportation Needs (05/27/2021)   PRAPARE - Administrator, Civil Service (Medical): No    Lack of Transportation (Non-Medical): No  Physical Activity: Sufficiently Active (05/27/2021)   Exercise Vital Sign    Days of Exercise per Week: 3 days    Minutes of Exercise per Session: 90 min  Stress: Not on file  Social Connections: Socially Integrated (05/27/2021)   Social Connection and Isolation Panel    Frequency of Communication with Friends and Family: More than three times a week    Frequency of Social Gatherings with Friends and Family: Three times a week    Attends Religious Services: More than 4 times per year    Active Member of Clubs or Organizations: Yes    Attends Banker Meetings: 1 to 4 times per year    Marital Status: Married  Catering manager Violence: Not At Risk (05/27/2021)   Humiliation, Afraid, Rape, and Kick questionnaire    Fear of  Current or Ex-Partner: No    Emotionally Abused: No    Physically Abused: No    Sexually Abused: No    FAMILY HISTORY: Family History  Problem Relation Age of Onset   Heart attack Mother    Varicose Veins Mother    COPD Father    Heart disease Father    Emphysema Father        smoker   Breast cancer Sister 49       mastectomy; metastasis to liver and lung   Cancer Sister    Obesity Brother    Breast cancer Maternal Aunt  dx. late 80s   Cancer Maternal Aunt    Alzheimer's disease Maternal Aunt    Breast cancer Maternal Aunt        dx. 70s   Squamous cell carcinoma Maternal Aunt        recently removed; dx. 95   Cancer Maternal Aunt    Heart disease Maternal Grandmother    Congestive Heart Failure Maternal Grandmother    Heart disease Paternal Grandmother    Breast cancer Cousin 45       genetic testing in approx 2015   Ovarian cancer Cousin        dx. 50s   Esophageal cancer Cousin        not a smoker   Neuropathy Neg Hx    Thyroid  disease Neg Hx    Colon cancer Neg Hx    Colon polyps Neg Hx    Rectal cancer Neg Hx    Stomach cancer Neg Hx     Review of Systems  Constitutional:  Negative for appetite change, chills, fatigue, fever and unexpected weight change.  HENT:   Negative for hearing loss, lump/mass and trouble swallowing.   Eyes:  Negative for eye problems and icterus.  Respiratory:  Negative for chest tightness, cough and shortness of breath.   Cardiovascular:  Negative for chest pain, leg swelling and palpitations.  Gastrointestinal:  Negative for abdominal distention, abdominal pain, constipation, diarrhea, nausea and vomiting.  Endocrine: Negative for hot flashes.  Genitourinary:  Negative for difficulty urinating.   Musculoskeletal:  Negative for arthralgias.  Skin:  Negative for itching and rash.  Neurological:  Negative for dizziness, extremity weakness, headaches and numbness.  Hematological:  Negative for adenopathy. Does not bruise/bleed  easily.  Psychiatric/Behavioral:  Negative for depression. The patient is not nervous/anxious.       PHYSICAL EXAMINATION   Onc Performance Status - 12/04/23 1200       KPS SCALE   KPS % SCORE Able to carry on normal activity, minor s/s of disease          There were no vitals filed for this visit.  Physical Exam Constitutional:      General: She is not in acute distress.    Appearance: Normal appearance. She is not toxic-appearing.  HENT:     Head: Normocephalic and atraumatic.     Mouth/Throat:     Mouth: Mucous membranes are moist.     Pharynx: Oropharynx is clear. No oropharyngeal exudate or posterior oropharyngeal erythema.  Eyes:     General: No scleral icterus. Cardiovascular:     Rate and Rhythm: Normal rate and regular rhythm.     Pulses: Normal pulses.     Heart sounds: Normal heart sounds.  Pulmonary:     Effort: Pulmonary effort is normal.     Breath sounds: Normal breath sounds.  Chest:     Comments: Left breast s/p lumpectomy and radiation, no sign of local recurrence, right breast benign Abdominal:     General: Abdomen is flat. Bowel sounds are normal. There is no distension.     Palpations: Abdomen is soft.     Tenderness: There is no abdominal tenderness.  Musculoskeletal:        General: No swelling.     Cervical back: Neck supple.  Lymphadenopathy:     Cervical: No cervical adenopathy.     Upper Body:     Right upper body: No supraclavicular or axillary adenopathy.     Left upper body: No supraclavicular or axillary  adenopathy.  Skin:    General: Skin is warm and dry.     Findings: No rash.  Neurological:     General: No focal deficit present.     Mental Status: She is alert.  Psychiatric:        Mood and Affect: Mood normal.        Behavior: Behavior normal.        ASSESSMENT and THERAPY PLAN:   Assessment and Plan Assessment & Plan History of left breast cancer and left breast DCIS, status post lumpectomy, under  surveillance Diagnosed with left breast cancer in 2006, treated with lumpectomy, adjuvant radiation, and tamoxifen. Recurrence as left breast DCIS in 2022, treated with lumpectomy. Currently under surveillance with no evidence of malignancy on the most recent mammogram. Unable to tolerate anastrozole  due to hives and declined tamoxifen due to hot flashes. Mammograms are effective due to breast density category B. Regular exercise (150 to 300 minutes per week) is associated with a lower rate of breast cancer recurrence. - Continue annual mammograms. - Optimize diet and exercise to reduce risk of recurrence.  Breast pain likely due to scar tissue Intermittent breast pain likely secondary to scar tissue from previous surgeries. No concerning findings on examination.   RTC in 1 year for continued f/u    All questions were answered. The patient knows to call the clinic with any problems, questions or concerns. We can certainly see the patient much sooner if necessary.  Total encounter time:20 minutes*in face-to-face visit time, chart review, lab review, care coordination, order entry, and documentation of the encounter time.    Morna Kendall, NP 12/04/23 12:19 PM Medical Oncology and Hematology [facility] [address] [place], [place] [address] Tel. [phone]    Fax. [phone]  *Total Encounter Time as defined by the Centers for Medicare and Medicaid Services includes, in addition to the face-to-face time of a patient visit (documented in the note above) non-face-to-face time: obtaining and reviewing outside history, ordering and reviewing medications, tests or procedures, care coordination (communications with other health care professionals or caregivers) and documentation in the medical record.

## 2024-01-01 DIAGNOSIS — G629 Polyneuropathy, unspecified: Secondary | ICD-10-CM | POA: Diagnosis not present

## 2024-01-01 DIAGNOSIS — E042 Nontoxic multinodular goiter: Secondary | ICD-10-CM | POA: Diagnosis not present

## 2024-01-01 DIAGNOSIS — M8580 Other specified disorders of bone density and structure, unspecified site: Secondary | ICD-10-CM | POA: Diagnosis not present

## 2024-01-01 DIAGNOSIS — R7301 Impaired fasting glucose: Secondary | ICD-10-CM | POA: Diagnosis not present

## 2024-01-01 DIAGNOSIS — K219 Gastro-esophageal reflux disease without esophagitis: Secondary | ICD-10-CM | POA: Diagnosis not present

## 2024-01-01 DIAGNOSIS — R911 Solitary pulmonary nodule: Secondary | ICD-10-CM | POA: Diagnosis not present

## 2024-01-01 DIAGNOSIS — Z0000 Encounter for general adult medical examination without abnormal findings: Secondary | ICD-10-CM | POA: Diagnosis not present

## 2024-01-01 DIAGNOSIS — N1831 Chronic kidney disease, stage 3a: Secondary | ICD-10-CM | POA: Diagnosis not present

## 2024-01-01 DIAGNOSIS — I700 Atherosclerosis of aorta: Secondary | ICD-10-CM | POA: Diagnosis not present

## 2024-01-01 DIAGNOSIS — Z6833 Body mass index (BMI) 33.0-33.9, adult: Secondary | ICD-10-CM | POA: Diagnosis not present

## 2024-01-01 DIAGNOSIS — E7800 Pure hypercholesterolemia, unspecified: Secondary | ICD-10-CM | POA: Diagnosis not present

## 2024-01-15 ENCOUNTER — Other Ambulatory Visit: Payer: Self-pay | Admitting: Internal Medicine

## 2024-01-19 ENCOUNTER — Ambulatory Visit
Admission: RE | Admit: 2024-01-19 | Discharge: 2024-01-19 | Disposition: A | Discharge status: Home / Self Care | Source: Ambulatory Visit | Attending: Obstetrics and Gynecology | Admitting: Obstetrics and Gynecology

## 2024-01-19 DIAGNOSIS — Z9889 Other specified postprocedural states: Secondary | ICD-10-CM

## 2024-01-19 DIAGNOSIS — N6320 Unspecified lump in the left breast, unspecified quadrant: Secondary | ICD-10-CM | POA: Diagnosis not present

## 2024-01-19 DIAGNOSIS — R928 Other abnormal and inconclusive findings on diagnostic imaging of breast: Secondary | ICD-10-CM | POA: Diagnosis not present

## 2024-01-20 DIAGNOSIS — E042 Nontoxic multinodular goiter: Secondary | ICD-10-CM | POA: Diagnosis not present

## 2024-01-22 ENCOUNTER — Emergency Department (HOSPITAL_BASED_OUTPATIENT_CLINIC_OR_DEPARTMENT_OTHER)
Admission: EM | Admit: 2024-01-22 | Discharge: 2024-01-22 | Disposition: A | Discharge status: Home / Self Care | Source: Ambulatory Visit | Attending: Emergency Medicine | Admitting: Emergency Medicine

## 2024-01-22 ENCOUNTER — Emergency Department (HOSPITAL_BASED_OUTPATIENT_CLINIC_OR_DEPARTMENT_OTHER)

## 2024-01-22 ENCOUNTER — Other Ambulatory Visit: Payer: Self-pay

## 2024-01-22 DIAGNOSIS — R519 Headache, unspecified: Secondary | ICD-10-CM | POA: Insufficient documentation

## 2024-01-22 DIAGNOSIS — Z853 Personal history of malignant neoplasm of breast: Secondary | ICD-10-CM | POA: Insufficient documentation

## 2024-01-22 LAB — CBC
HCT: 42.8 % (ref 36.0–46.0)
Hemoglobin: 14.9 g/dL (ref 12.0–15.0)
MCH: 33.1 pg (ref 26.0–34.0)
MCHC: 34.8 g/dL (ref 30.0–36.0)
MCV: 95.1 fL (ref 80.0–100.0)
Platelets: 181 K/uL (ref 150–400)
RBC: 4.5 MIL/uL (ref 3.87–5.11)
RDW: 12.7 % (ref 11.5–15.5)
WBC: 6.5 K/uL (ref 4.0–10.5)
nRBC: 0 % (ref 0.0–0.2)

## 2024-01-22 LAB — BASIC METABOLIC PANEL WITH GFR
Anion gap: 11 (ref 5–15)
BUN: 16 mg/dL (ref 8–23)
CO2: 24 mmol/L (ref 22–32)
Calcium: 11.1 mg/dL — ABNORMAL HIGH (ref 8.9–10.3)
Chloride: 106 mmol/L (ref 98–111)
Creatinine, Ser: 0.96 mg/dL (ref 0.44–1.00)
GFR, Estimated: >60 mL/min (ref >60)
Glucose, Bld: 117 mg/dL — ABNORMAL HIGH (ref 70–99)
Potassium: 4.3 mmol/L (ref 3.5–5.1)
Sodium: 141 mmol/L (ref 135–145)

## 2024-01-22 MED ORDER — LACTATED RINGERS IV BOLUS
500.0000 mL | Freq: Once | INTRAVENOUS | Status: AC
  Administered 2024-01-22: 500 mL via INTRAVENOUS

## 2024-01-22 MED ORDER — PREDNISONE 10 MG (21) PO TBPK: ORAL_TABLET | Freq: Every day | ORAL | 0 refills | Status: DC

## 2024-01-22 MED ORDER — PROCHLORPERAZINE EDISYLATE 10 MG/2ML IJ SOLN
5.0000 mg | INTRAMUSCULAR | Status: AC
  Administered 2024-01-22: 5 mg via INTRAVENOUS
  Filled 2024-01-22: qty 2

## 2024-01-22 MED ORDER — METHYLPREDNISOLONE SODIUM SUCC 125 MG IJ SOLR
125.0000 mg | Freq: Once | INTRAMUSCULAR | Status: AC
  Administered 2024-01-22: 125 mg via INTRAVENOUS
  Filled 2024-01-22: qty 2

## 2024-01-22 NOTE — Discharge Instructions (Signed)
 No definite cause of your headache was found today.  You had a CT scan that did not show any acute abnormalities without contrast.  However, sometimes contrast shows special conditions.  The main reason for getting a CT today was to assure that there was no bleeding or mass that was causing increased pressure or movement of your brain.  The remainder of your exam is normal.  You are being started on some steroids as this appears to possibly be some nerve pain. However, if you have worsening headache, new symptoms such as fever, weakness, or other new symptoms, please return for reevaluation immediately. Please follow-up with your doctor next week

## 2024-01-22 NOTE — ED Triage Notes (Signed)
 Patient states left sided headache since Tuesday night. Denies n/v. Denies dizziness and blurred vision. Sent by urgent care. Denies blood thinner use.

## 2024-01-22 NOTE — ED Notes (Signed)
 Reviewed AVS/discharge instruction with patient. Time allotted for and all questions answered. Patient is agreeable for d/c and escorted to ed exit by staff.

## 2024-01-22 NOTE — ED Provider Notes (Signed)
 [place] EMERGENCY DEPARTMENT AT [facility] Provider Note   CSN: [id number] Arrival date & time: 01/22/24  1230     Patient presents with: Headache   Meghan [name] is a 78 y.o. female.   HPI 78 year old female presents today complaining of headache that began on Tuesday.  It is on the left side behind her ear.  It was sudden onset.  She has had no similar symptoms in the past.  She denies fever, chills, head injury, blood thinner use.  She does have a history of breast cancer with recurrence in 2022.  She had a lumpectomy and is currently being monitored.  She denies visual changes, lateralized deficits or weakness.  She denies any ear pain or hearing changes     Prior to Admission medications   Medication Sig Start Date End Date Taking? Authorizing Provider  predniSONE  (STERAPRED UNI-PAK 21 TAB) 10 MG (21) TBPK tablet Take by mouth daily. Take 6 tabs by mouth daily  for 2 days, then 5 tabs for 2 days, then 4 tabs for 2 days, then 3 tabs for 2 days, 2 tabs for 2 days, then 1 tab by mouth daily for 2 days 01/22/24  Yes Levander Houston, MD  b complex vitamins capsule Take 1 capsule by mouth daily.    [provider]  Cholecalciferol  (VITAMIN D3) 2000 units TABS Take 2,000 Units by mouth daily.     [provider]  clidinium-chlordiazePOXIDE  (LIBRAX ) 5-2.5 MG capsule Take 1 capsule by mouth as needed. 07/16/23 11/09/24  Abran Norleen SAILOR, MD  Coenzyme Q10 (CO Q-10) 100 MG CAPS Take 200 mg by mouth daily.    [provider]  Cranberry-Vitamin C-Probiotic (AZO CRANBERRY) 250-30 MG TABS Take 1 tablet by mouth 2 (two) times daily.     [provider]  fluticasone (FLONASE) 50 MCG/ACT nasal spray Place 1 spray into both nostrils daily as needed for allergies or rhinitis.    [provider]  Glucosamine HCl (GLUCOSAMINE PO) Take by mouth 2 (two) times daily. 1500/1200 mg    [provider]  Iron, Ferrous Sulfate, 325 (65 Fe) MG TABS Take 1  tablet by mouth daily. 07/14/22   [provider]  MAGNESIUM  PO Take 240 mg by mouth daily. 12/16/21   [provider]  Multiple Vitamin (MULTIVITAMIN) tablet Take 1 tablet by mouth every evening.     [provider]  NON FORMULARY Citrical plus d 2 a day    [provider]  Omega-3 Fatty Acids (FISH OIL) 1200 MG CAPS Take 1,200 mg by mouth daily.     [provider]  pantoprazole  (PROTONIX ) 40 MG tablet TAKE 1 TABLET DAILY 01/15/24   Abran Norleen SAILOR, MD  pregabalin  (LYRICA ) 50 MG capsule TAKE UP TO 2 CAPSULES AT BEDTIME FOR RESTLESS LEG SYMPTOMS 07/13/23   Gayland Lauraine PARAS, NP  rosuvastatin  (CRESTOR ) 10 MG tablet TAKE 1 TABLET DAILY 10/16/23   Kate Lonni CROME, MD  TURMERIC PO Take 1 capsule by mouth 2 (two) times a day. 1000 mg    [provider]    Allergies: Lisinopril , Amlodipine , Anastrozole , Flagyl  [metronidazole ], Nsaids, and Tetracycline hcl    Review of Systems  Updated Vital Signs BP (!) 155/74 (BP Location: Right Arm)   Pulse 64   Temp 98.2 F (36.8 C) (Oral)   Resp 16   SpO2 96%   Physical Exam Vitals reviewed.  Constitutional:      Appearance: She is normal weight.  HENT:  Head: Normocephalic and atraumatic.     Comments: Left TM is normal Oropharynx is normal No obvious trauma There is some tenderness above the ear on the left    Mouth/Throat:     Pharynx: Oropharynx is clear.  Eyes:     Extraocular Movements: Extraocular movements intact.     Pupils: Pupils are equal, round, and reactive to light.  Cardiovascular:     Rate and Rhythm: Normal rate and regular rhythm.     Heart sounds: Normal heart sounds.  Pulmonary:     Effort: Pulmonary effort is normal.     Breath sounds: Normal breath sounds.  Abdominal:     Palpations: Abdomen is soft.  Musculoskeletal:        General: Normal range of motion.     Cervical back: Normal range of motion and neck supple.  Skin:    General: Skin is warm and dry.   Neurological:     Mental Status: She is alert.     Cranial Nerves: No cranial nerve deficit, dysarthria or facial asymmetry.     Sensory: No sensory deficit.     Motor: No weakness.  Psychiatric:        Mood and Affect: Mood normal.        Speech: Speech normal.        Behavior: Behavior normal.     (all labs ordered are listed, but only abnormal results are displayed) Labs Reviewed  BASIC METABOLIC PANEL WITH GFR - Abnormal; Notable for the following components:      Result Value   Glucose, Bld 117 (*)    Calcium  11.1 (*)    All other components within normal limits  CBC    EKG: None  Radiology: CT Head Wo Contrast Result Date: 01/22/2024 EXAM: CT HEAD WITHOUT CONTRAST 01/22/2024 02:25:00 PM TECHNIQUE: CT of the head was performed without the administration of intravenous contrast. Automated exposure control, iterative reconstruction, and/or weight based adjustment of the mA/kV was utilized to reduce the radiation dose to as low as reasonably achievable. COMPARISON: None available. CLINICAL HISTORY: Headache, new onset (Age >= 51y). Headache FINDINGS: BRAIN AND VENTRICLES: Patchy hypoattenuation in the periventricular white matter, most consistent with mild chronic small vessel disease. 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 No acute hemorrhage. No evidence of acute infarct. No hydrocephalus. No extra-axial collection. No mass effect or midline shift. ORBITS: No acute abnormality. SINUSES: No acute abnormality. SOFT TISSUES AND SKULL: No acute soft tissue abnormality. No skull fracture. IMPRESSION: 1. No acute intracranial abnormality. Electronically signed by: Ryan Chess MD 01/22/2024 02:31 PM EDT RP Workstation: [id number]     Procedures   Medications Ordered in the ED  lactated ringers  bolus 500 mL ( Intravenous Stopped 01/22/24 1531)  methylPREDNISolone  sodium succinate (SOLU-MEDROL ) 125 mg/2 mL injection 125 mg (125 mg Intravenous Given 01/22/24 1458)   prochlorperazine  (COMPAZINE ) injection 5 mg (5 mg Intravenous Given 01/22/24 1500)    Clinical Course as of 01/22/24 1637  Fri Jan 22, 2024  1515 CT head reviewed interpreted no acute abnormality noted radiologist interpretation concurs [DR]  1637 CBC reviewed interpreted and normal complete metabolic panel reviewed interpreted and normal [DR]    Clinical Course User Index [DR] Levander Houston, MD                                 Medical Decision Making Amount and/or Complexity of Data Reviewed Labs: ordered. Radiology: ordered.  Risk Prescription drug management.   78 year old female presents today complaining of headache.  Onset was fairly sudden.  Patient has a history of breast cancer.  Head CT was obtained to rule out bleeding.  No definitive intracranial abnormality was noted.  No evidence of mastoiditis on CT.  No evidence of paranasal sinusitis. Patient is afebrile and doubt infectious etiology including meningitis.  There is some tenderness behind her left ear but no redness or rashes noted.  This may represent occipital neuralgia.  Doubt other acute etiologies such as mastoiditis, otitis, or bleeding. We have discussed return precautions and need for follow-up.  Will treat with a short course of steroids.     Final diagnoses:  Acute nonintractable headache, unspecified headache type    ED Discharge Orders          Ordered    predniSONE  (STERAPRED UNI-PAK 21 TAB) 10 MG (21) TBPK tablet  Daily        01/22/24 1634               Levander Houston, MD 01/22/24 1637

## 2024-01-23 ENCOUNTER — Other Ambulatory Visit: Payer: Self-pay | Admitting: Neurology

## 2024-01-25 NOTE — Telephone Encounter (Signed)
 Requested Prescriptions   Pending Prescriptions Disp Refills   pregabalin  (LYRICA ) 50 MG capsule [Pharmacy Med Name: PREGABALIN  CAPS 50MG ] 180 capsule 1    Sig: TAKE UP TO 2 CAPSULES AT BEDTIME FOR RESTLESS LEG SYMPTOMS   Last seen 08/06/23 Next appt 02/09/24 Dispenses   Dispensed Days Supply Quantity Provider Pharmacy  PREGABALIN  50 MG CAPSULE 10/16/2023 90 180 capsule [place] Lauraine PARAS, NP [facility]...  PREGABALIN  50 MG CAPSULE 07/13/2023 90 180 capsule Gayland Lauraine PARAS, NP [facility]...  PREGABALIN  50 MG CAPSULE 04/20/2023 90 180 capsule Gayland Lauraine PARAS, NP [facility]...    Routing to work in

## 2024-02-01 DIAGNOSIS — M8588 Other specified disorders of bone density and structure, other site: Secondary | ICD-10-CM | POA: Diagnosis not present

## 2024-02-01 DIAGNOSIS — R350 Frequency of micturition: Secondary | ICD-10-CM | POA: Diagnosis not present

## 2024-02-01 DIAGNOSIS — Z6832 Body mass index (BMI) 32.0-32.9, adult: Secondary | ICD-10-CM | POA: Diagnosis not present

## 2024-02-01 DIAGNOSIS — Z124 Encounter for screening for malignant neoplasm of cervix: Secondary | ICD-10-CM | POA: Diagnosis not present

## 2024-02-01 DIAGNOSIS — N958 Other specified menopausal and perimenopausal disorders: Secondary | ICD-10-CM | POA: Diagnosis not present

## 2024-02-02 DIAGNOSIS — N390 Urinary tract infection, site not specified: Secondary | ICD-10-CM | POA: Diagnosis not present

## 2024-02-08 ENCOUNTER — Ambulatory Visit: Admitting: Neurology

## 2024-02-09 ENCOUNTER — Ambulatory Visit: Admitting: Neurology

## 2024-02-09 ENCOUNTER — Encounter: Payer: Self-pay | Admitting: Neurology

## 2024-02-09 VITALS — BP 150/82 | HR 94 | Ht 62.0 in | Wt 182.0 lb

## 2024-02-09 DIAGNOSIS — G2581 Restless legs syndrome: Secondary | ICD-10-CM

## 2024-02-09 NOTE — Patient Instructions (Signed)
 Great to see you today  Continue current medications Call for worsening symptoms Follow up in 1 year

## 2024-02-09 NOTE — Progress Notes (Signed)
 Patient: Meghan [name] Date of Birth: 01-08-1946  Reason for Visit: Follow up for RLS History from: Patient Primary Neurologist: Meghan [name]   ASSESSMENT AND PLAN 78 y.o. year old female   1.  Restless leg syndrome 2.  Polyneuropathy  -Overall, RLS symptoms stable, continue Lyrica  at current dosing 50 mg, 2 capsules at bedtime -Tried and failed: Mirapex , gabapentin  -Follow-up in 1 year or sooner if needed  HISTORY OF PRESENT ILLNESS: Today 02/09/24 Went to the ER for severe left sided headache. CT head was fine. Given IV steroid, prednisone  taper, had side effect.  For RLS, remains on Lyrica  50 mg 2 capsules at bedtime, magnesium  at bedtime. Most nights RLS symptoms under good control, occasionally will have to take Tylenol .   08/06/23 SS: Remains on Lyrica  50 mg, 2 capsules at bedtime, Iron twice a week. Takes at 8 PM. No symptoms during the day. Occasionally head tremor. RLS symptoms continue at night, most nights can get to sleep, occasionally has to take Tylenol . Stress with her husband who has dementia.  01/14/23 SS: Remains on Lyrica  50 mg, 2 capsules at bedtime. Mostly under good control. Only 3 times since Feb she has had to get up and walk around at night, take Tylenol  for RLS. Is on Iron twice weekly. Works part-time for [employer]. At night her feet burn and tingle, magnesium  has helped. With walking some gait instability, no headache, dizziness. No falls.   07/02/22 SS: Takes Lyrica  50 mg, 2 capsules at bedtime, 1-2 nights a week RLS symptoms are still bothersome, may take Extra Strength Tylenol  or has occasionally taken extra Lyrica . Likely RLS  symptoms related to her mind racing, stress at work. Sometimes in the morning feels off balance first thing. Likes Lyrica  better than gabapentin . She did take some Iron but it gave her diarrhea, it did help her RLS.  Started multivitamin with some iron.  Update 12/24/21 SS: Meghan [name] is here today for follow-up.  Has been seen  for RLS, is on Mirapex  and gabapentin . 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 Husband has poor memory. She works part time [employer]. She stopped the Mirapex  last October due to feeling groggy in the AM, off balance. RLS symptoms have worsened, most nights has to take Tylenol  to get to sleep. Still unsteady at times, often related to chronic ankle injury to left. No falls. RLS symptoms bother her 5/7 nights a week. Doesn't bother her during the day. Recurrent breast cancer last October had lumpectomy. Not on any chemo agents, mammogram next month.  On B12 supplement.  Intermittent head and neck tremor exacerbated by anxiety.  Also on magnesium .  HISTORY  12/21/20 Dr. Jenel: Meghan [name] is a 78 year old right-handed white female with a history of restless leg syndrome dating back over a years.  The patient returns to the office today with some new issues.  She has reported over the last 2 or 3 months that she has noted some sensation of imbalance.  She notes that when she closes her eyes that she has difficulty maintaining posture.  She recently fell off of a stepladder, possibly related to this imbalance issue.  She notes that if she touches the wall or chair, this improves the sensation of imbalance.  She does report some numbness in the toes of the feet at night with some stinging sensations.  She does have low back pain without radiation down the lower extremities.  She will occasionally get injections in the back with some benefit.  She has been told that she has spondylolisthesis.  The patient denies any trouble controlling the bowels or the bladder.  She denies any numbness or sensory alteration in the hands.  She denies neck pain.  She has developed a mild tremor that is side to side involving the head and neck, no tremors of the arms are noted.  She denies any family history of diabetes.  REVIEW OF SYSTEMS: Out of a complete 14 system review of symptoms, the patient complains only of  the following symptoms, and all other reviewed systems are negative.  See HPI  ALLERGIES: Allergies  Allergen Reactions   Lisinopril  Swelling    SOB & swelling    Amlodipine  Swelling    Swelling face   Anastrozole  Hives and Other (See Comments)   Flagyl  [Metronidazole ] Nausea Only   Nsaids Other (See Comments)    CKD - per PCP  Other reaction(s): Other (See Comments)  CKD per PCP  Other Reaction(s): Not available   Prednisone  Nausea And Vomiting   Tetracycline Hcl Other (See Comments)    HOME MEDICATIONS: Outpatient Medications Prior to Visit  Medication Sig Dispense Refill   b complex vitamins capsule Take 1 capsule by mouth daily.     Cholecalciferol  (VITAMIN D3) 2000 units TABS Take 2,000 Units by mouth daily.      clidinium-chlordiazePOXIDE  (LIBRAX ) 5-2.5 MG capsule Take 1 capsule by mouth as needed. 30 capsule 3   Coenzyme Q10 (CO Q-10) 100 MG CAPS Take 200 mg by mouth daily.     Cranberry-Vitamin C-Probiotic (AZO CRANBERRY) 250-30 MG TABS Take 1 tablet by mouth 2 (two) times daily.      fluticasone (FLONASE) 50 MCG/ACT nasal spray Place 1 spray into both nostrils daily as needed for allergies or rhinitis.     Glucosamine HCl (GLUCOSAMINE PO) Take by mouth 2 (two) times daily. 1500/1200 mg     Iron, Ferrous Sulfate, 325 (65 Fe) MG TABS Take 1 tablet by mouth daily.     MAGNESIUM  PO Take 240 mg by mouth daily.     Multiple Vitamin (MULTIVITAMIN) tablet Take 1 tablet by mouth every evening.      NON FORMULARY Citrical plus d 2 a day     Omega-3 Fatty Acids (FISH OIL) 1200 MG CAPS Take 1,200 mg by mouth daily.      pantoprazole  (PROTONIX ) 40 MG tablet TAKE 1 TABLET DAILY 90 tablet 3   pregabalin  (LYRICA ) 50 MG capsule TAKE UP TO 2 CAPSULES AT BEDTIME FOR RESTLESS LEG SYMPTOMS 180 capsule 1   rosuvastatin  (CRESTOR ) 10 MG tablet TAKE 1 TABLET DAILY 90 tablet 3   TURMERIC PO Take 1 capsule by mouth 2 (two) times a day. 1000 mg     predniSONE  (STERAPRED UNI-PAK 21 TAB) 10  MG (21) TBPK tablet Take by mouth daily. Take 6 tabs by mouth daily  for 2 days, then 5 tabs for 2 days, then 4 tabs for 2 days, then 3 tabs for 2 days, 2 tabs for 2 days, then 1 tab by mouth daily for 2 days 42 tablet 0   No facility-administered medications prior to visit.    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%   Cataract    REMOVED 05-2020 AND 06-2020 BILAT   Colon polyps    Hyperplastic    Diverticulosis of colon (without mention of hemorrhage)  Esophageal stricture    GERD (gastroesophageal reflux disease)    History of radiation therapy    History of urinary tract infection    Hypertension    CONTROLLED- OFF MEDS -   Lower back pain    Neuromuscular disorder (HCC)    peripheral nueropathy   Numbness    feet bilat at nighttime   Osteopenia    Personal history of radiation therapy 2006   Restless leg syndrome 03/27/2014   Tremor, 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   Head and neck tremor   Trimalleolar fracture of left ankle 08/24/2015   Urinary urgency     PAST SURGICAL HISTORY: Past Surgical History:  Procedure Laterality Date   BREAST BIOPSY Left 01/2021   BREAST LUMPECTOMY Left 2006   BREAST LUMPECTOMY Left 02/12/2021   BREAST LUMPECTOMY WITH RADIOACTIVE SEED LOCALIZATION Left 02/12/2021   Procedure: LEFT BREAST LUMPECTOMY WITH RADIOACTIVE SEED LOCALIZATION;  Surgeon: Vanderbilt Ned, MD;  Location: [facility];  Service: General;  Laterality: Left;   BREAST SURGERY     left 2006   CESAREAN SECTION     COLONOSCOPY     EYE SURGERY  2022   FRACTURE SURGERY  2017   ORIF ANKLE FRACTURE Left 08/24/2015   Procedure: OPEN REDUCTION INTERNAL FIXATION (ORIF) LEFT ANKLE FRACTURE;  Surgeon: Lonni CINDERELLA Poli, MD;  Location: [facility];  Service: Orthopedics;  Laterality: Left;  General with a popliteal block   POLYPECTOMY     HPP 2011     FAMILY HISTORY: Family History  Problem Relation Age of Onset   Heart attack Mother    Varicose Veins Mother    COPD Father    Heart disease Father    Emphysema Father        smoker   Breast cancer Sister 82       mastectomy; metastasis to liver and lung   Cancer Sister    Obesity Brother    Breast cancer Maternal Aunt        dx. late 74s   Cancer Maternal Aunt    Alzheimer's disease Maternal Aunt    Breast cancer Maternal Aunt        dx. 70s   Squamous cell carcinoma Maternal Aunt        recently removed; dx. 95   Cancer Maternal Aunt    Heart disease Maternal Grandmother    Congestive Heart Failure Maternal Grandmother    Heart disease Paternal Grandmother    Breast cancer Cousin 63       genetic testing in approx 2015   Ovarian cancer Cousin        dx. 50s   Esophageal cancer Cousin        not a smoker   Neuropathy Neg Hx    Thyroid  disease Neg Hx    Colon cancer Neg Hx    Colon polyps Neg Hx    Rectal cancer Neg Hx    Stomach cancer Neg Hx     SOCIAL HISTORY: Social History   Socioeconomic History   Marital status: Married    Spouse name: Not on file   Number of children: 2   Years of education: AS   Highest education level: Not on file  Occupational History   Occupation: Teacher, adult education: [employer]   Occupation: Retired  Tobacco Use   Smoking status: Never   Smokeless tobacco: Never  Vaping Use   Vaping status: Never Used  Substance and Sexual Activity   Alcohol use: Yes  Comment: rare occ drink wine   Drug use: No   Sexual activity: Yes  Other Topics Concern   Not on file  Social History Narrative   Lives at home w/ her husband   Right-handed   Occasionally drinks tea   Social Drivers of Health   Financial Resource Strain: Low Risk  (05/27/2021)   Overall Financial Resource Strain (CARDIA)    Difficulty of Paying Living Expenses: Not very hard  Food Insecurity: No Food Insecurity (05/27/2021)   Hunger Vital Sign     Worried About Running Out of Food in the Last Year: Never true    Ran Out of Food in the Last Year: Never true  Transportation Needs: No Transportation Needs (05/27/2021)   PRAPARE - Administrator, Civil Service (Medical): No    Lack of Transportation (Non-Medical): No  Physical Activity: Sufficiently Active (05/27/2021)   Exercise Vital Sign    Days of Exercise per Week: 3 days    Minutes of Exercise per Session: 90 min  Stress: Not on file  Social Connections: Socially Integrated (05/27/2021)   Social Connection and Isolation Panel    Frequency of Communication with Friends and Family: More than three times a week    Frequency of Social Gatherings with Friends and Family: Three times a week    Attends Religious Services: More than 4 times per year    Active Member of Clubs or Organizations: Yes    Attends Banker Meetings: 1 to 4 times per year    Marital Status: Married  Catering manager Violence: Not At Risk (05/27/2021)   Humiliation, Afraid, Rape, and Kick questionnaire    Fear of Current or Ex-Partner: No    Emotionally Abused: No    Physically Abused: No    Sexually Abused: No   PHYSICAL EXAM  Vitals:   02/09/24 1429 02/09/24 1436  BP: (!) 156/74 (!) 150/82  Pulse: 94   SpO2: 97%   Weight: 182 lb (82.6 kg)   Height: 5' 2 (1.575 m)     Body mass index is 33.29 kg/m.  Generalized: Well developed, in no acute distress  Neurological examination  Mentation: Alert oriented to time, place, history taking. Follows all commands speech and language fluent Cranial nerve II-XII: Pupils were equal round reactive to light. Extraocular movements were full, visual field were full on confrontational test. Facial sensation and strength were normal. Head turning and shoulder shrug  were normal and symmetric.  Intermittent side to side head tremor mild noted. Motor: The motor testing reveals 5 over 5 strength of all 4 extremities. Good symmetric motor tone is  noted throughout.  Sensory: Sensory testing is intact to soft touch on all 4 extremities. No evidence of extinction is noted.  Coordination: Cerebellar testing reveals good finger-nose-finger and heel-to-shin bilaterally.  Gait and station: Gait is slightly wide-based, independent Reflexes: Deep tendon reflexes are symmetric and normal  DIAGNOSTIC DATA (LABS, IMAGING, TESTING) - I reviewed patient records, labs, notes, testing and imaging myself where available.  Lab Results  Component Value Date   WBC 6.5 01/22/2024   HGB 14.9 01/22/2024   HCT 42.8 01/22/2024   MCV 95.1 01/22/2024   PLT 181 01/22/2024      Component Value Date/Time   NA 141 01/22/2024 1505   NA 140 11/09/2019 0945   K 4.3 01/22/2024 1505   CL 106 01/22/2024 1505   CO2 24 01/22/2024 1505   GLUCOSE 117 (H) 01/22/2024 1505   BUN 16  01/22/2024 1505   BUN 14 11/09/2019 0945   CREATININE 0.96 01/22/2024 1505   CREATININE 0.96 01/23/2021 0816   CALCIUM  11.1 (H) 01/22/2024 1505   PROT 7.1 01/23/2021 0816   PROT 6.7 12/21/2020 0824   ALBUMIN 3.6 01/23/2021 0816   AST 24 01/23/2021 0816   ALT 19 01/23/2021 0816   ALKPHOS 60 01/23/2021 0816   BILITOT 0.6 01/23/2021 0816   GFRNONAA >60 01/22/2024 1505   GFRNONAA >60 01/23/2021 0816   GFRAA 69 11/09/2019 0945   Lab Results  Component Value Date   CHOL 119 01/13/2020   HDL 49 01/13/2020   LDLCALC 49 01/13/2020   TRIG 117 01/13/2020   CHOLHDL 2.4 01/13/2020   No results found for: HGBA1C Lab Results  Component Value Date   VITAMINB12 463 12/21/2020   No results found for: TSH  Lauraine Born, AGNP-C, DNP 02/09/2024, 3:00 PM [facility] [address], [address] [place], [place] [address] [phone]

## 2024-02-18 ENCOUNTER — Inpatient Hospital Stay (HOSPITAL_BASED_OUTPATIENT_CLINIC_OR_DEPARTMENT_OTHER)
Admission: EM | Admit: 2024-02-18 | Discharge: 2024-02-21 | DRG: 299 | Disposition: A | Discharge status: Home / Self Care | Source: Ambulatory Visit | Attending: Internal Medicine | Admitting: Internal Medicine

## 2024-02-18 ENCOUNTER — Encounter (HOSPITAL_BASED_OUTPATIENT_CLINIC_OR_DEPARTMENT_OTHER): Payer: Self-pay | Admitting: Emergency Medicine

## 2024-02-18 ENCOUNTER — Emergency Department (HOSPITAL_BASED_OUTPATIENT_CLINIC_OR_DEPARTMENT_OTHER)

## 2024-02-18 ENCOUNTER — Other Ambulatory Visit: Payer: Self-pay

## 2024-02-18 DIAGNOSIS — Z820 Family history of epilepsy and other diseases of the nervous system: Secondary | ICD-10-CM | POA: Diagnosis not present

## 2024-02-18 DIAGNOSIS — R0902 Hypoxemia: Secondary | ICD-10-CM | POA: Diagnosis not present

## 2024-02-18 DIAGNOSIS — I2699 Other pulmonary embolism without acute cor pulmonale: Secondary | ICD-10-CM | POA: Diagnosis present

## 2024-02-18 DIAGNOSIS — Z886 Allergy status to analgesic agent status: Secondary | ICD-10-CM

## 2024-02-18 DIAGNOSIS — K219 Gastro-esophageal reflux disease without esophagitis: Secondary | ICD-10-CM | POA: Diagnosis present

## 2024-02-18 DIAGNOSIS — Z800 Family history of malignant neoplasm of digestive organs: Secondary | ICD-10-CM

## 2024-02-18 DIAGNOSIS — Z923 Personal history of irradiation: Secondary | ICD-10-CM | POA: Diagnosis not present

## 2024-02-18 DIAGNOSIS — Z79899 Other long term (current) drug therapy: Secondary | ICD-10-CM | POA: Diagnosis not present

## 2024-02-18 DIAGNOSIS — Z8249 Family history of ischemic heart disease and other diseases of the circulatory system: Secondary | ICD-10-CM | POA: Diagnosis not present

## 2024-02-18 DIAGNOSIS — Z6833 Body mass index (BMI) 33.0-33.9, adult: Secondary | ICD-10-CM | POA: Diagnosis not present

## 2024-02-18 DIAGNOSIS — R0602 Shortness of breath: Secondary | ICD-10-CM | POA: Diagnosis present

## 2024-02-18 DIAGNOSIS — M79661 Pain in right lower leg: Secondary | ICD-10-CM | POA: Diagnosis not present

## 2024-02-18 DIAGNOSIS — I824Z1 Acute embolism and thrombosis of unspecified deep veins of right distal lower extremity: Secondary | ICD-10-CM | POA: Diagnosis not present

## 2024-02-18 DIAGNOSIS — Z825 Family history of asthma and other chronic lower respiratory diseases: Secondary | ICD-10-CM | POA: Diagnosis not present

## 2024-02-18 DIAGNOSIS — I82401 Acute embolism and thrombosis of unspecified deep veins of right lower extremity: Secondary | ICD-10-CM | POA: Diagnosis not present

## 2024-02-18 DIAGNOSIS — Z853 Personal history of malignant neoplasm of breast: Secondary | ICD-10-CM | POA: Diagnosis not present

## 2024-02-18 DIAGNOSIS — Z889 Allergy status to unspecified drugs, medicaments and biological substances status: Secondary | ICD-10-CM

## 2024-02-18 DIAGNOSIS — Z803 Family history of malignant neoplasm of breast: Secondary | ICD-10-CM

## 2024-02-18 DIAGNOSIS — Z8041 Family history of malignant neoplasm of ovary: Secondary | ICD-10-CM | POA: Diagnosis not present

## 2024-02-18 DIAGNOSIS — N1831 Chronic kidney disease, stage 3a: Secondary | ICD-10-CM | POA: Diagnosis present

## 2024-02-18 DIAGNOSIS — I2782 Chronic pulmonary embolism: Secondary | ICD-10-CM | POA: Diagnosis not present

## 2024-02-18 DIAGNOSIS — G629 Polyneuropathy, unspecified: Secondary | ICD-10-CM | POA: Diagnosis present

## 2024-02-18 DIAGNOSIS — I129 Hypertensive chronic kidney disease with stage 1 through stage 4 chronic kidney disease, or unspecified chronic kidney disease: Secondary | ICD-10-CM | POA: Diagnosis present

## 2024-02-18 DIAGNOSIS — I82431 Acute embolism and thrombosis of right popliteal vein: Secondary | ICD-10-CM | POA: Diagnosis not present

## 2024-02-18 DIAGNOSIS — Z888 Allergy status to other drugs, medicaments and biological substances status: Secondary | ICD-10-CM

## 2024-02-18 DIAGNOSIS — I82409 Acute embolism and thrombosis of unspecified deep veins of unspecified lower extremity: Secondary | ICD-10-CM | POA: Diagnosis present

## 2024-02-18 DIAGNOSIS — K222 Esophageal obstruction: Secondary | ICD-10-CM | POA: Diagnosis present

## 2024-02-18 DIAGNOSIS — M7989 Other specified soft tissue disorders: Secondary | ICD-10-CM | POA: Diagnosis not present

## 2024-02-18 DIAGNOSIS — G2581 Restless legs syndrome: Secondary | ICD-10-CM | POA: Diagnosis present

## 2024-02-18 DIAGNOSIS — E785 Hyperlipidemia, unspecified: Secondary | ICD-10-CM | POA: Diagnosis present

## 2024-02-18 DIAGNOSIS — I82413 Acute embolism and thrombosis of femoral vein, bilateral: Secondary | ICD-10-CM | POA: Diagnosis not present

## 2024-02-18 DIAGNOSIS — I82441 Acute embolism and thrombosis of right tibial vein: Secondary | ICD-10-CM | POA: Diagnosis not present

## 2024-02-18 DIAGNOSIS — I824Y1 Acute embolism and thrombosis of unspecified deep veins of right proximal lower extremity: Principal | ICD-10-CM | POA: Diagnosis present

## 2024-02-18 LAB — CBC WITH DIFFERENTIAL/PLATELET
Abs Immature Granulocytes: 0.09 K/uL — ABNORMAL HIGH (ref 0.00–0.07)
Basophils Absolute: 0 K/uL (ref 0.0–0.1)
Basophils Relative: 0 %
Eosinophils Absolute: 0.2 K/uL (ref 0.0–0.5)
Eosinophils Relative: 3 %
HCT: 42.8 % (ref 36.0–46.0)
Hemoglobin: 14.6 g/dL (ref 12.0–15.0)
Immature Granulocytes: 1 %
Lymphocytes Relative: 26 %
Lymphs Abs: 2 K/uL (ref 0.7–4.0)
MCH: 32.4 pg (ref 26.0–34.0)
MCHC: 34.1 g/dL (ref 30.0–36.0)
MCV: 95.1 fL (ref 80.0–100.0)
Monocytes Absolute: 0.7 K/uL (ref 0.1–1.0)
Monocytes Relative: 9 %
Neutro Abs: 4.5 K/uL (ref 1.7–7.7)
Neutrophils Relative %: 61 %
Platelets: 182 K/uL (ref 150–400)
RBC: 4.5 MIL/uL (ref 3.87–5.11)
RDW: 12.5 % (ref 11.5–15.5)
WBC: 7.5 K/uL (ref 4.0–10.5)
nRBC: 0 % (ref 0.0–0.2)

## 2024-02-18 LAB — COMPREHENSIVE METABOLIC PANEL WITH GFR
ALT: 17 U/L (ref 0–44)
AST: 25 U/L (ref 15–41)
Albumin: 4.3 g/dL (ref 3.5–5.0)
Alkaline Phosphatase: 67 U/L (ref 38–126)
Anion gap: 12 (ref 5–15)
BUN: 19 mg/dL (ref 8–23)
CO2: 25 mmol/L (ref 22–32)
Calcium: 11.1 mg/dL — ABNORMAL HIGH (ref 8.9–10.3)
Chloride: 103 mmol/L (ref 98–111)
Creatinine, Ser: 0.85 mg/dL (ref 0.44–1.00)
GFR, Estimated: >60 mL/min (ref >60)
Glucose, Bld: 120 mg/dL — ABNORMAL HIGH (ref 70–99)
Potassium: 3.9 mmol/L (ref 3.5–5.1)
Sodium: 140 mmol/L (ref 135–145)
Total Bilirubin: 0.4 mg/dL (ref 0.0–1.2)
Total Protein: 8 g/dL (ref 6.5–8.1)

## 2024-02-18 LAB — PRO BRAIN NATRIURETIC PEPTIDE: Pro Brain Natriuretic Peptide: 257 pg/mL (ref <300.0)

## 2024-02-18 LAB — TROPONIN T, HIGH SENSITIVITY
Troponin T High Sensitivity: <15 ng/L (ref 0–19)
Troponin T High Sensitivity: <15 ng/L (ref 0–19)

## 2024-02-18 MED ORDER — IOHEXOL 350 MG/ML SOLN
75.0000 mL | Freq: Once | INTRAVENOUS | Status: AC | PRN
  Administered 2024-02-18: 75 mL via INTRAVENOUS

## 2024-02-18 MED ORDER — HEPARIN (PORCINE) 25000 UT/250ML-% IV SOLN
1250.0000 [IU]/h | INTRAVENOUS | Status: DC
  Administered 2024-02-18 – 2024-02-21 (×3): 1250 [IU]/h via INTRAVENOUS
  Filled 2024-02-18 (×4): qty 250

## 2024-02-18 MED ORDER — HEPARIN BOLUS VIA INFUSION
5000.0000 [IU] | Freq: Once | INTRAVENOUS | Status: AC
  Administered 2024-02-18: 5000 [IU] via INTRAVENOUS

## 2024-02-18 NOTE — ED Provider Notes (Signed)
 [place] EMERGENCY DEPARTMENT AT [facility] Provider Note   CSN: [id number] Arrival date & time: 02/18/24  1215     Patient presents with: Leg Pain   Meghan [name] is a 78 y.o. female.   The history is provided by the patient and medical records. No language interpreter was used.  Leg Pain Location:  Leg Time since incident:  2 days Injury: no   Leg location:  R lower leg and R upper leg Pain details:    Quality:  Aching   Radiates to:  Does not radiate   Severity:  Moderate   Onset quality:  Gradual   Duration:  2 days   Timing:  Constant   Progression:  Unchanged Chronicity:  New Prior injury to area:  No Relieved by:  Nothing Worsened by:  Nothing Ineffective treatments:  None tried Associated symptoms: swelling   Associated symptoms: no back pain, no fatigue, no fever, no neck pain and no stiffness        Prior to Admission medications   Medication Sig Start Date End Date Taking? Authorizing Provider  b complex vitamins capsule Take 1 capsule by mouth daily.    [provider]  Cholecalciferol  (VITAMIN D3) 2000 units TABS Take 2,000 Units by mouth daily.     [provider]  clidinium-chlordiazePOXIDE  (LIBRAX ) 5-2.5 MG capsule Take 1 capsule by mouth as needed. 07/16/23 11/09/24  Abran Norleen SAILOR, MD  Coenzyme Q10 (CO Q-10) 100 MG CAPS Take 200 mg by mouth daily.    [provider]  Cranberry-Vitamin C-Probiotic (AZO CRANBERRY) 250-30 MG TABS Take 1 tablet by mouth 2 (two) times daily.     [provider]  fluticasone (FLONASE) 50 MCG/ACT nasal spray Place 1 spray into both nostrils daily as needed for allergies or rhinitis.    [provider]  Glucosamine HCl (GLUCOSAMINE PO) Take by mouth 2 (two) times daily. 1500/1200 mg    [provider]  Iron, Ferrous Sulfate, 325 (65 Fe) MG TABS Take 1 tablet by mouth daily. 07/14/22   [provider]  MAGNESIUM  PO Take 240 mg by mouth daily. 12/16/21    [provider]  Multiple Vitamin (MULTIVITAMIN) tablet Take 1 tablet by mouth every evening.     [provider]  NON FORMULARY Citrical plus d 2 a day    [provider]  Omega-3 Fatty Acids (FISH OIL) 1200 MG CAPS Take 1,200 mg by mouth daily.     [provider]  pantoprazole  (PROTONIX ) 40 MG tablet TAKE 1 TABLET DAILY 01/15/24   Abran Norleen SAILOR, MD  predniSONE  (STERAPRED UNI-PAK 21 TAB) 10 MG (21) TBPK tablet Take by mouth daily. Take 6 tabs by mouth daily  for 2 days, then 5 tabs for 2 days, then 4 tabs for 2 days, then 3 tabs for 2 days, 2 tabs for 2 days, then 1 tab by mouth daily for 2 days 01/22/24   Levander Houston, MD  pregabalin  (LYRICA ) 50 MG capsule TAKE UP TO 2 CAPSULES AT BEDTIME FOR RESTLESS LEG SYMPTOMS 01/25/24   Buck Saucer, MD  rosuvastatin  (CRESTOR ) 10 MG tablet TAKE 1 TABLET DAILY 10/16/23   Kate Lonni CROME, MD  TURMERIC PO Take 1 capsule by mouth 2 (two) times a day. 1000 mg    [provider]    Allergies: Lisinopril , Amlodipine , Anastrozole , Flagyl  [metronidazole ], Nsaids, Prednisone , and Tetracycline hcl    Review of Systems  Constitutional:  Negative for chills, fatigue and fever.  HENT:  Negative for congestion.   Eyes:  Negative for visual disturbance.  Respiratory:  Positive for shortness of breath. Negative for cough, chest tightness, wheezing and stridor.   Cardiovascular:  Positive for leg swelling. Negative for chest pain and palpitations.  Gastrointestinal:  Negative for abdominal pain, constipation, diarrhea, nausea and vomiting.  Genitourinary:  Negative for dysuria and flank pain.  Musculoskeletal:  Negative for back pain, neck pain, neck stiffness and stiffness.  Skin:  Negative for rash and wound.  Neurological:  Negative for weakness, light-headedness and headaches.  Psychiatric/Behavioral:  Negative for agitation and confusion.   All other systems reviewed and are negative.   Updated Vital Signs BP  (!) 165/101 (BP Location: Right Arm)   Pulse 95   Temp 98 F (36.7 C)   Resp 20   Ht 5' 2 (1.575 m)   Wt 82.6 kg   SpO2 96%   BMI 33.29 kg/m   Physical Exam Vitals and nursing note reviewed.  Constitutional:      General: She is not in acute distress.    Appearance: She is well-developed. She is not ill-appearing, toxic-appearing or diaphoretic.  HENT:     Head: Normocephalic and atraumatic.     Nose: Nose normal.     Mouth/Throat:     Mouth: Mucous membranes are moist.  Eyes:     Conjunctiva/sclera: Conjunctivae normal.  Cardiovascular:     Rate and Rhythm: Normal rate and regular rhythm.     Heart sounds: No murmur heard. Pulmonary:     Effort: Pulmonary effort is normal. No respiratory distress.     Breath sounds: Normal breath sounds. No wheezing, rhonchi or rales.  Chest:     Chest wall: No tenderness.  Abdominal:     General: Abdomen is flat.     Palpations: Abdomen is soft.     Tenderness: There is no abdominal tenderness. There is no guarding or rebound.  Musculoskeletal:        General: Swelling and tenderness present.     Cervical back: Neck supple. No tenderness.     Right lower leg: Edema present.     Left lower leg: Edema present.  Skin:    General: Skin is warm and dry.     Capillary Refill: Capillary refill takes less than 2 seconds.     Findings: No rash.  Neurological:     General: No focal deficit present.     Mental Status: She is alert.     Sensory: No sensory deficit.     Motor: No weakness.  Psychiatric:        Mood and Affect: Mood normal.     (all labs ordered are listed, but only abnormal results are displayed) Labs Reviewed  CBC WITH DIFFERENTIAL/PLATELET - Abnormal; Notable for the following components:      Result Value   Abs Immature Granulocytes 0.09 (*)    All other components within normal limits  COMPREHENSIVE METABOLIC PANEL WITH GFR - Abnormal; Notable for the following components:   Glucose, Bld 120 (*)    Calcium  11.1  (*)    All other components within normal limits  PRO BRAIN NATRIURETIC PEPTIDE  HEPARIN LEVEL (UNFRACTIONATED)  TROPONIN T, HIGH SENSITIVITY  TROPONIN T, HIGH SENSITIVITY    EKG: EKG Interpretation Date/Time:  Thursday February 18 2024 16:07:29 EDT Ventricular Rate:  85 PR Interval:  166 QRS Duration:  87 QT Interval:  385 QTC Calculation: 458 R Axis:   -13  Text Interpretation: Sinus  rhythm Low voltage, precordial leads Consider anterior infarct when compared top rior, similar appearance No STEMI Confirmed by Ginger Barefoot ([id number]) on 02/18/2024 4:17:54 PM  Radiology: CT Angio Chest PE W and/or Wo Contrast Addendum Date: 02/18/2024 ** ADDENDUM #1 ** RESULTS WERE DISCUSSED WITH DR. CLEOTIS BY DR. MAPLE AT 06:15 PM ON 02/18/2024. Electronically signed by: Greig MAPLE MD 02/18/2024 06:16 PM EDT RP Workstation: [id number]   Result Date: 02/18/2024 ** ORIGINAL REPORT ** EXAM: CTA CHEST 02/18/2024 05:43:33 PM TECHNIQUE: CTA of the chest was performed without and with the administration of 75 mL of intravenous iohexol (OMNIPAQUE) 350 MG/ML injection. Multiplanar reformatted images are provided for review. MIP images are provided for review. Automated exposure control, iterative reconstruction, and/or weight based adjustment of the mA/kV was utilized to reduce the radiation dose to as low as reasonably achievable. COMPARISON: Chest CT 04/05/2021. CLINICAL HISTORY: Pulmonary embolism (PE) suspected, high prob; Positive for DVT in right leg, shortness of breath today and yesterday with exertion. Rule out PE. Pt from PCP caox4 ambulatory NAD c/o pain and swelling in R lower leg since yesterday morning, PCP told pt to come to ED for DVT and PE r/o. Denies SOB and CP. FINDINGS: PULMONARY ARTERIES: Pulmonary arteries are adequately opacified for evaluation. Pulmonary emboli are seen within the distal right main pulmonary artery extending into a lobar, segmental and subsegmental branches of the right  lower lobe. There are segmental right upper lobe pulmonary emboli as well. Main pulmonary artery is normal in caliber. MEDIASTINUM: The heart and pericardium demonstrate no acute abnormality. There is no acute abnormality of the thoracic aorta. There is a small hiatal hernia. LYMPH NODES: There is a prominent right hilar lymph node measuring 9 mm. No mediastinal or axillary lymphadenopathy. LUNGS AND PLEURA: Atelectatic changes in the lung bases. No focal consolidation or pulmonary edema. No evidence of pleural effusion or pneumothorax. UPPER ABDOMEN: Limited images of the upper abdomen are unremarkable. SOFT TISSUES AND BONES: There is scarring and surgical clips in the left breast with some coarse calcifications. No acute bone abnormality. IMPRESSION: 1. Pulmonary emboli within the distal right main pulmonary artery extending into lobar, segmental, and subsegmental branches of the right lower lobe, as well as segmental right upper lobe pulmonary emboli. No evidence for right heart strain. Electronically signed by: Greig MAPLE MD 02/18/2024 06:07 PM EDT RP Workstation: [id number]   US  Venous Img Lower Unilateral Right (DVT) Result Date: 02/18/2024 CLINICAL DATA:  Right calf pain. EXAM: RIGHT LOWER EXTREMITY VENOUS DOPPLER ULTRASOUND TECHNIQUE: Gray-scale sonography with graded compression, as well as color Doppler and duplex ultrasound were performed to evaluate the lower extremity deep venous systems from the level of the common femoral vein and including the common femoral, femoral, profunda femoral, popliteal and calf veins including the posterior tibial, peroneal and gastrocnemius veins when visible. Spectral Doppler was utilized to evaluate flow at rest and with distal augmentation maneuvers in the common femoral, femoral and popliteal veins. COMPARISON:  None Available. FINDINGS: Contralateral Common Femoral Vein: No evidence of thrombus. Normal compressibility and color Doppler flow. Common Femoral  Vein: Positive for thrombus. Nonobstructive echogenic thrombus in the right common femoral vein. Saphenofemoral Junction: No evidence of thrombus. Normal compressibility and flow on color Doppler imaging. Profunda Femoral Vein: No evidence of thrombus. Normal compressibility and flow on color Doppler imaging. Femoral Vein: Positive for thrombus. Femoral vein is not compressible and contains echogenic thrombus. Popliteal Vein: Positive for thrombus. Popliteal vein is not compressible and contains echogenic thrombus. Popliteal thrombus  is occlusive. Calf Veins: Limited evaluation but there is evidence for thrombus involving posterior tibial and peroneal veins. Other: Great saphenous vein is compressible without thrombus. IMPRESSION: Positive for deep venous thrombosis in the right lower extremity. Thrombus involving the right common femoral vein, right femoral vein, right popliteal vein and right calf veins. These results were called by telephone at the time of interpretation on 02/18/2024 at 2:45 pm to provider [facility] , who verbally acknowledged these results. Electronically Signed   By: Juliene Balder M.D.   On: 02/18/2024 14:45     Procedures   CRITICAL CARE Performed by: Lonni PARAS Chayah Mckee Total critical care time: 30 minutes Critical care time was exclusive of separately billable procedures and treating other patients. Critical care was necessary to treat or prevent imminent or life-threatening deterioration. Critical care was time spent personally by me on the following activities: development of treatment plan with patient and/or surrogate as well as nursing, discussions with consultants, evaluation of patient's response to treatment, examination of patient, obtaining history from patient or surrogate, ordering and performing treatments and interventions, ordering and review of laboratory studies, ordering and review of radiographic studies, pulse oximetry and re-evaluation of patient's  condition.   Medications Ordered in the ED  iohexol (OMNIPAQUE) 350 MG/ML injection 75 mL (75 mLs Intravenous Contrast Given 02/18/24 1730)                                    Medical Decision Making Amount and/or Complexity of Data Reviewed Labs: ordered. Radiology: ordered.  Risk Prescription drug management. Decision regarding hospitalization.    Meghan [name] is a 78 y.o. female with a past medical history significant for breast cancer x 2, neuropathy, GERD, diverticulosis, hypertension, CKD, and previous esophageal stricture who presents from PCP for evaluation of 2 days of shortness of breath and right leg pain and swelling.  According to patient, she has no history of blood clots that has not had any recent surgeries or travel but had pain in her right calf going up her leg and shortness of breath.  It is not at rest but with exertion.  She describes no pain at the moment in her chest or back.  Denies fevers, chills, congestion, cough, nausea, vomiting, constipation, diarrhea, or urinary changes.  Reports the mild swelling in her left leg is unchanged from baseline.  She does not take blood thinners.  On exam, lungs clear.  Chest nontender.  No murmur.  Abdomen nontender.  She has intact distal pulses and has edema in her right leg greater than her left.  Back and flanks nontender.  Oxygen saturations are reassuring on room air initially.  EKG shows no STEMI.   Patient had ultrasound from PCP that is positive for a right sided DVT going up into the right common femoral vein right femoral vein right popliteal vein, and right calf veins.  We will get labs and due to her new exertional shortness of breath, we will get a CT PE study and cardiac workup.  Anticipate reassess after workup to determine if she needs vascular consultation or admission.  7:15 PM Patient indeed has pulmonary embolism in the right lung and the main pulmonary artery.  No evidence of heart strain however  patient still was short of breath with ambulation.  Do not feel she is safe for discharge home at this time.  Will start heparin and call for medical  admission for DVT going up to the common femoral vein and for symptomatic pulmonary embolism.  She will be admitted for further management.     Final diagnoses:  Acute deep vein thrombosis (DVT) of proximal vein of right lower extremity (HCC)  Shortness of breath  Other acute pulmonary embolism, unspecified whether acute cor pulmonale present (HCC)    Clinical Impression: 1. Acute deep vein thrombosis (DVT) of proximal vein of right lower extremity (HCC)   2. Shortness of breath   3. Other acute pulmonary embolism, unspecified whether acute cor pulmonale present (HCC)     Disposition: Admit  This note was prepared with assistance of Dragon voice recognition software. Occasional wrong-word or sound-a-like substitutions may have occurred due to the inherent limitations of voice recognition software.      Vinh Sachs, Lonni PARAS, MD 02/18/24 [phone]

## 2024-02-18 NOTE — ED Notes (Signed)
 Patient transported to CT

## 2024-02-18 NOTE — Progress Notes (Signed)
 ANTICOAGULATION CONSULT NOTE  Pharmacy Consult for Heparin Indication: pulmonary embolus  Allergies  Allergen Reactions   Lisinopril  Swelling    SOB & swelling    Amlodipine  Swelling    Swelling face   Anastrozole  Hives and Other (See Comments)   Flagyl  [Metronidazole ] Nausea Only   Nsaids Other (See Comments)    CKD - per PCP  Other reaction(s): Other (See Comments)  CKD per PCP  Other Reaction(s): Not available   Prednisone  Nausea And Vomiting   Tetracycline Hcl Other (See Comments)    Patient Measurements: Height: 5' 2 (157.5 cm) Weight: 82.6 kg (182 lb) IBW/kg (Calculated) : 50.1 Heparin Dosing Weight: 68.6 kg  Vital Signs: Temp: 97.9 F (36.6 C) (10/16 1758) Temp Source: Oral (10/16 1758) BP: 159/82 (10/16 1822) Pulse Rate: 80 (10/16 1822)  Labs: Recent Labs    02/18/24 1604  HGB 14.6  HCT 42.8  PLT 182  CREATININE 0.85    Estimated Creatinine Clearance: 54.3 mL/min (by C-G formula based on SCr of 0.85 mg/dL).   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%   Cataract    REMOVED 05-2020 AND 06-2020 BILAT   Colon polyps    Hyperplastic    Diverticulosis of colon (without mention of hemorrhage)    Esophageal stricture    GERD (gastroesophageal reflux disease)    History of radiation therapy    History of urinary tract infection    Hypertension    CONTROLLED- OFF MEDS -   Lower back pain    Neuromuscular disorder (HCC)    peripheral nueropathy   Numbness    feet bilat at nighttime   Osteopenia    Personal history of radiation therapy 2006   Restless leg syndrome 03/27/2014   Tremor, 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 Patient is presenting with SOB and leg swelling. Heparin per pharmacy consult placed for pulmonary embolus.  US  w/ rt sided DVT CTA PE significant for PE w/out RHS  Patient is not on anticoagulation prior to arrival.  Hgb 14.6; plt 182  Goal of Therapy:  Heparin level 0.3-0.7 units/ml Monitor platelets by anticoagulation protocol: Yes   Plan:  Give IV heparin 5000 units bolus x 1 Start heparin infusion at 1250 units/hr Check anti-Xa level in 8 hours and daily while on heparin Continue to monitor H&H and platelets  Dorn Buttner, PharmD, BCPS 02/18/2024 7:40 PM ED Clinical Pharmacist -  [phone]

## 2024-02-18 NOTE — ED Notes (Signed)
 In CT

## 2024-02-18 NOTE — ED Triage Notes (Signed)
 Pt from PCP caox4 ambulatory NAD c/o pain and swelling in R lower leg since yesterday morning, PCP told pt to come to ED for DVT and PE r/o. Denies SOB and CP.

## 2024-02-19 ENCOUNTER — Telehealth (HOSPITAL_COMMUNITY): Payer: Self-pay | Admitting: Pharmacy Technician

## 2024-02-19 ENCOUNTER — Other Ambulatory Visit (HOSPITAL_COMMUNITY): Payer: Self-pay

## 2024-02-19 DIAGNOSIS — I824Z1 Acute embolism and thrombosis of unspecified deep veins of right distal lower extremity: Secondary | ICD-10-CM

## 2024-02-19 LAB — HEPARIN LEVEL (UNFRACTIONATED)
Heparin Unfractionated: 0.7 [IU]/mL (ref 0.30–0.70)
Heparin Unfractionated: 0.65 [IU]/mL (ref 0.30–0.70)

## 2024-02-19 LAB — MAGNESIUM: Magnesium: 1.7 mg/dL (ref 1.7–2.4)

## 2024-02-19 LAB — HEMOGLOBIN A1C
Hgb A1c MFr Bld: 6.1 % — ABNORMAL HIGH (ref 4.8–5.6)
Mean Plasma Glucose: 128.37 mg/dL

## 2024-02-19 MED ORDER — MORPHINE SULFATE (PF) 2 MG/ML IV SOLN: 2.0000 mg | INTRAVENOUS | Status: AC | PRN

## 2024-02-19 MED ORDER — ROSUVASTATIN CALCIUM 5 MG PO TABS
10.0000 mg | ORAL_TABLET | Freq: Every day | ORAL | Status: DC
  Administered 2024-02-19 – 2024-02-20 (×2): 10 mg via ORAL
  Filled 2024-02-19 (×4): qty 2

## 2024-02-19 MED ORDER — ACETAMINOPHEN 325 MG PO TABS
ORAL_TABLET | ORAL | Status: AC
  Filled 2024-02-19: qty 1

## 2024-02-19 MED ORDER — SODIUM CHLORIDE 0.9% FLUSH
3.0000 mL | Freq: Two times a day (BID) | INTRAVENOUS | Status: DC
  Administered 2024-02-19 – 2024-02-21 (×5): 3 mL via INTRAVENOUS

## 2024-02-19 MED ORDER — MAGNESIUM GLUCONATE 500 (27 MG) MG PO TABS
500.0000 mg | ORAL_TABLET | Freq: Once | ORAL | Status: AC
  Administered 2024-02-19: 500 mg via ORAL
  Filled 2024-02-19: qty 1

## 2024-02-19 MED ORDER — FLUTICASONE PROPIONATE 50 MCG/ACT NA SUSP: 1.0000 | Freq: Every day | NASAL | Status: DC | PRN

## 2024-02-19 MED ORDER — PREGABALIN 25 MG PO CAPS
50.0000 mg | ORAL_CAPSULE | Freq: Every evening | ORAL | Status: DC | PRN
  Administered 2024-02-19 – 2024-02-20 (×2): 50 mg via ORAL
  Filled 2024-02-19 (×2): qty 2

## 2024-02-19 MED ORDER — B COMPLEX-C PO TABS
1.0000 | ORAL_TABLET | Freq: Every day | ORAL | Status: DC
Start: 2024-02-20 — End: 2024-02-21
  Administered 2024-02-20 – 2024-02-21 (×2): 1 via ORAL
  Filled 2024-02-19 (×2): qty 1

## 2024-02-19 MED ORDER — ACETAMINOPHEN 325 MG PO TABS
650.0000 mg | ORAL_TABLET | Freq: Once | ORAL | Status: AC
  Administered 2024-02-19: 650 mg via ORAL
  Filled 2024-02-19: qty 2

## 2024-02-19 MED ORDER — MELATONIN 5 MG PO TABS
5.0000 mg | ORAL_TABLET | Freq: Once | ORAL | Status: AC
  Administered 2024-02-19: 5 mg via ORAL
  Filled 2024-02-19: qty 1

## 2024-02-19 MED ORDER — B COMPLEX VITAMINS PO CAPS: 1.0000 | ORAL_CAPSULE | Freq: Every day | ORAL | Status: DC

## 2024-02-19 MED ORDER — HYDRALAZINE HCL 20 MG/ML IJ SOLN: 5.0000 mg | INTRAMUSCULAR | Status: DC | PRN

## 2024-02-19 MED ADMIN — Acetaminophen Tab 500 MG: 1000 mg | ORAL | NDC 00904673061

## 2024-02-19 MED FILL — Acetaminophen Tab 500 MG: 1000.0000 mg | ORAL | Qty: 2 | Status: AC

## 2024-02-19 NOTE — Telephone Encounter (Signed)
 Patient Product/process development scientist completed.    The patient is insured through [employer].     Ran test claim for Eliquis 5 mg and the current 30 day co-pay is $43.00.  Ran test claim for Xarelto 20 mg and the current 30 day co-pay is $43.00.  This test claim was processed through [facility]- copay amounts may vary at other pharmacies due to [employer], or as the patient moves through the different stages of their insurance plan.     Reyes Sharps, CPHT Pharmacy Technician Patient Advocate Specialist [facility] Patient Advocate Team Direct Number: [phone]  Fax: [phone]

## 2024-02-19 NOTE — ED Notes (Signed)
 Called Taryn at [facility] for transport

## 2024-02-19 NOTE — Progress Notes (Signed)
 ANTICOAGULATION CONSULT NOTE Pharmacy Consult for Heparin Indication: pulmonary embolus  Allergies  Allergen Reactions   Lisinopril  Swelling    SOB & swelling    Amlodipine  Swelling    Swelling face   Anastrozole  Hives and Other (See Comments)   Flagyl  [Metronidazole ] Nausea Only   Nsaids Other (See Comments)    CKD - per PCP  Other reaction(s): Other (See Comments)  CKD per PCP  Other Reaction(s): Not available   Prednisone  Nausea And Vomiting   Tetracycline Hcl Other (See Comments)    Patient Measurements: Height: 5' 2 (157.5 cm) Weight: 82.2 kg (181 lb 4.8 oz) IBW/kg (Calculated) : 50.1 Heparin Dosing Weight: 68.6 kg  Vital Signs: Temp: 98.3 F (36.8 C) (10/17 1256) Temp Source: Oral (10/17 1256) BP: 166/81 (10/17 1256) Pulse Rate: 85 (10/17 1256)  Labs: Recent Labs    02/18/24 1604 02/19/24 0553 02/19/24 1401  HGB 14.6  --   --   HCT 42.8  --   --   PLT 182  --   --   HEPARINUNFRC  --  0.70 0.65  CREATININE 0.85  --   --     Estimated Creatinine Clearance: 54.2 mL/min (by C-G formula based on SCr of 0.85 mg/dL).  Assessment: 78 y.o. female with PE/DVT for heparin. Heparin level remains therapeutic.  Goal of Therapy:  Heparin level 0.3-0.7 units/ml Monitor platelets by anticoagulation protocol: Yes   Plan:  Heparin 1250 units/h Daily heparin level and CBC  Ozell Jamaica, PharmD, [place], [facility] Clinical Pharmacist [phone] Please check AMION for all [facility] Pharmacy numbers 02/19/2024

## 2024-02-19 NOTE — Care Management Obs Status (Signed)
 MEDICARE OBSERVATION STATUS NOTIFICATION   Patient Details  Name: Meghan [name] MRN: [id number] Date of Birth: 08/01/1945   Medicare Observation Status Notification Given:  Yes    Vonzell Arrie Sharps 02/19/2024, 3:17 PM

## 2024-02-19 NOTE — H&P (Signed)
 History and Physical    Patient: Meghan [name] FMW:[id number] DOB: 23-Feb-1946 DOA: 02/18/2024 DOS: the patient was seen and examined on 02/19/2024 . PCP: Katina Pfeiffer, PA-C  Patient coming from: [facility] Chief complaint: Chief Complaint  Patient presents with   Leg Pain   HPI:  Meghan [name] is a 78 y.o. female with past medical history  of allergies to multiple meds i.e. lisinopril , amlodipine , anastrozole , Flagyl , NSAIDs, prednisone , tetracycline,Breast cancer, esophageal stricture/GERD, history of essential hypertension, UTI, neuropathy, restless leg syndrome presented yesterday to [facility] for right leg pain that has been going on for about 2 to 3 days pain is nonradiating intermittent unresolving associated with swelling..  Patient also had reporting swelling with the pain, patient also had shortness of breath that she noted.  No reports of chest pain palpitation nausea vomiting wheezing fevers or chills.   ED Course:  Vital signs in the ED were notable for the following:  Vitals:   02/19/24 1020 02/19/24 1030 02/19/24 1200 02/19/24 1256  BP:  (!) 157/69 (!) 150/77 (!) 166/81  Pulse: 88 92 83 85  Temp: 98.4 F (36.9 C)   98.3 F (36.8 C)  Resp: 17 12 12 18   Height:    5' 2 (1.575 m)  Weight:    82.2 kg  SpO2: 95% 96% 98% 96%  TempSrc: Oral   Oral  BMI (Calculated):    33.15   >>ED evaluation thus far shows: Initial EKG was sinus rhythm low voltage with precordial leads and concern for anterior infarct no history of heart disease, heart rate 85 PR 166 QTc 458. CMP shows glucose of 120 elevated calcium  at 11.1, normal electrolytes kidney function and LFTs otherwise. Normal CBC with differential. Lower extremity ultrasound positive for DVT in right lower extremity involving the right common femoral vein right femoral vein left right popliteal and right calf veins. Patient also had CTA chest PE protocol with her history of shortness of breath and was found to have a PE  in the distal right main pulmonary artery extending into lobar segmental and subsegmental branches of the right lower lobe as well as segmental right upper lobe pulmonary emboli no evidence of right heart strain.  >>While in the ED patient received the following:   heparin ADULT infusion 100 units/mL (25000 units/250mL) (1,250 Units/hr Intravenous New Bag/Given 02/18/24 2000)  iohexol (OMNIPAQUE) 350 MG/ML injection 75 mL (75 mLs Intravenous Contrast Given 02/18/24 1730)  heparin bolus via infusion 5,000 Units (5,000 Units Intravenous Bolus from Bag 02/18/24 2000)  acetaminophen  (TYLENOL ) tablet 1,000 mg (1,000 mg Oral Given 02/19/24 0249)  acetaminophen  (TYLENOL ) tablet 650 mg (650 mg Oral Given 02/19/24 0939)   Review of Systems  Respiratory:  Positive for shortness of breath.   Cardiovascular:  Positive for leg swelling.   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%   Cataract    REMOVED 05-2020 AND 06-2020 BILAT   Colon polyps    Hyperplastic    Diverticulosis of colon (without mention of hemorrhage)    Esophageal stricture    GERD (gastroesophageal reflux disease)    History of radiation therapy    History of urinary tract infection    Hypertension    CONTROLLED- OFF MEDS -   Lower back pain    Neuromuscular disorder (HCC)    peripheral nueropathy   Numbness    feet bilat at  nighttime   Osteopenia    Personal history of radiation therapy 2006   Restless leg syndrome 03/27/2014   Tremor, 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   Head and neck tremor   Trimalleolar fracture of left ankle 08/24/2015   Urinary urgency    Past Surgical History:  Procedure Laterality Date   BREAST BIOPSY Left 01/2021   BREAST LUMPECTOMY Left 2006   BREAST LUMPECTOMY Left 02/12/2021   BREAST LUMPECTOMY WITH RADIOACTIVE SEED LOCALIZATION Left 02/12/2021   Procedure: LEFT BREAST LUMPECTOMY WITH RADIOACTIVE SEED  LOCALIZATION;  Surgeon: Vanderbilt Ned, MD;  Location: [facility];  Service: General;  Laterality: Left;   BREAST SURGERY     left 2006   CESAREAN SECTION     COLONOSCOPY     EYE SURGERY  2022   FRACTURE SURGERY  2017   ORIF ANKLE FRACTURE Left 08/24/2015   Procedure: OPEN REDUCTION INTERNAL FIXATION (ORIF) LEFT ANKLE FRACTURE;  Surgeon: Lonni CINDERELLA Poli, MD;  Location: [facility];  Service: Orthopedics;  Laterality: Left;  General with a popliteal block   POLYPECTOMY     HPP 2011    reports that she has never smoked. She has never used smokeless tobacco. She reports current alcohol use. She reports that she does not use drugs. Allergies  Allergen Reactions   Lisinopril  Swelling    SOB & swelling    Amlodipine  Swelling    Swelling face   Anastrozole  Hives and Other (See Comments)   Flagyl  [Metronidazole ] Nausea Only   Nsaids Other (See Comments)    CKD - per PCP  Other reaction(s): Other (See Comments)  CKD per PCP  Other Reaction(s): Not available   Prednisone  Nausea And Vomiting   Tetracycline Hcl Other (See Comments)   Family History  Problem Relation Age of Onset   Heart attack Mother    Varicose Veins Mother    COPD Father    Heart disease Father    Emphysema Father        smoker   Breast cancer Sister 7       mastectomy; metastasis to liver and lung   Cancer Sister    Obesity Brother    Breast cancer Maternal Aunt        dx. late 34s   Cancer Maternal Aunt    Alzheimer's disease Maternal Aunt    Breast cancer Maternal Aunt        dx. 70s   Squamous cell carcinoma Maternal Aunt        recently removed; dx. 95   Cancer Maternal Aunt    Heart disease Maternal Grandmother    Congestive Heart Failure Maternal Grandmother    Heart disease Paternal Grandmother    Breast cancer Cousin 63       genetic testing in approx 2015   Ovarian cancer Cousin        dx. 50s   Esophageal cancer Cousin        not a smoker   Neuropathy Neg Hx     Thyroid  disease Neg Hx    Colon cancer Neg Hx    Colon polyps Neg Hx    Rectal cancer Neg Hx    Stomach cancer Neg Hx    Prior to Admission medications   Medication Sig Start Date End Date Taking? Authorizing Provider  b complex vitamins capsule Take 1 capsule by mouth daily.    [provider]  Cholecalciferol  (VITAMIN D3) 2000 units TABS Take 2,000 Units by mouth daily.     [provider]  clidinium-chlordiazePOXIDE  (LIBRAX ) 5-2.5 MG capsule Take 1 capsule by mouth as needed. 07/16/23 11/09/24  Abran Norleen SAILOR, MD  Coenzyme Q10 (CO Q-10) 100 MG CAPS Take 200 mg by mouth daily.    [provider]  Cranberry-Vitamin C-Probiotic (AZO CRANBERRY) 250-30 MG TABS Take 1 tablet by mouth 2 (two) times daily.     [provider]  fluticasone (FLONASE) 50 MCG/ACT nasal spray Place 1 spray into both nostrils daily as needed for allergies or rhinitis.    [provider]  Glucosamine HCl (GLUCOSAMINE PO) Take by mouth 2 (two) times daily. 1500/1200 mg    [provider]  Iron, Ferrous Sulfate, 325 (65 Fe) MG TABS Take 1 tablet by mouth daily. 07/14/22   [provider]  MAGNESIUM  PO Take 240 mg by mouth daily. 12/16/21   [provider]  Multiple Vitamin (MULTIVITAMIN) tablet Take 1 tablet by mouth every evening.     [provider]  NON FORMULARY Citrical plus d 2 a day    [provider]  Omega-3 Fatty Acids (FISH OIL) 1200 MG CAPS Take 1,200 mg by mouth daily.     [provider]  pantoprazole  (PROTONIX ) 40 MG tablet TAKE 1 TABLET DAILY 01/15/24   Abran Norleen SAILOR, MD  predniSONE  (STERAPRED UNI-PAK 21 TAB) 10 MG (21) TBPK tablet Take by mouth daily. Take 6 tabs by mouth daily  for 2 days, then 5 tabs for 2 days, then 4 tabs for 2 days, then 3 tabs for 2 days, 2 tabs for 2 days, then 1 tab by mouth daily for 2 days 01/22/24   Levander Houston, MD  pregabalin  (LYRICA ) 50 MG capsule TAKE UP TO 2 CAPSULES AT BEDTIME FOR  RESTLESS LEG SYMPTOMS 01/25/24   Buck Saucer, MD  rosuvastatin  (CRESTOR ) 10 MG tablet TAKE 1 TABLET DAILY 10/16/23   Kate Lonni CROME, MD  TURMERIC PO Take 1 capsule by mouth 2 (two) times a day. 1000 mg    [provider]                                                                                 Vitals:   02/19/24 1020 02/19/24 1030 02/19/24 1200 02/19/24 1256  BP:  (!) 157/69 (!) 150/77 (!) 166/81  Pulse: 88 92 83 85  Resp: 17 12 12 18   Temp: 98.4 F (36.9 C)   98.3 F (36.8 C)  TempSrc: Oral   Oral  SpO2: 95% 96% 98% 96%  Weight:    82.2 kg  Height:    5' 2 (1.575 m)   Physical Exam Vitals reviewed.  Constitutional:      General: She is not in acute distress.    Appearance: She is not ill-appearing.  HENT:     Head: Normocephalic.  Eyes:     Extraocular Movements: Extraocular movements intact.  Cardiovascular:     Rate and Rhythm: Normal rate and regular rhythm.     Heart sounds: Normal heart sounds.  Pulmonary:     Breath sounds: Normal breath sounds.  Abdominal:     General: There is no distension.     Palpations: Abdomen is soft.  Tenderness: There is no abdominal tenderness.  Neurological:     General: No focal deficit present.     Mental Status: She is alert and oriented to person, place, and time.     Labs on Admission: I have personally reviewed following labs and imaging studies CBC: Recent Labs  Lab 02/18/24 1604  WBC 7.5  NEUTROABS 4.5  HGB 14.6  HCT 42.8  MCV 95.1  PLT 182   Basic Metabolic Panel: Recent Labs  Lab 02/18/24 1604  NA 140  K 3.9  CL 103  CO2 25  GLUCOSE 120*  BUN 19  CREATININE 0.85  CALCIUM  11.1*   GFR: Estimated Creatinine Clearance: 54.2 mL/min (by C-G formula based on SCr of 0.85 mg/dL). Liver Function Tests: Recent Labs  Lab 02/18/24 1604  AST 25  ALT 17  ALKPHOS 67  BILITOT 0.4  PROT 8.0  ALBUMIN 4.3   No results for input(s): LIPASE, AMYLASE in the last 168 hours. No results  for input(s): AMMONIA in the last 168 hours. Recent Labs    01/22/24 1505 02/18/24 1604  BUN 16 19  CREATININE 0.96 0.85    Cardiac Enzymes: No results for input(s): CKTOTAL, CKMB, CKMBINDEX, TROPONINI in the last 168 hours. BNP (last 3 results) Recent Labs    02/18/24 1604  PROBNP 257.0   HbA1C: No results for input(s): HGBA1C in the last 72 hours. CBG: No results for input(s): GLUCAP in the last 168 hours. Lipid Profile: No results for input(s): CHOL, HDL, LDLCALC, TRIG, CHOLHDL, LDLDIRECT in the last 72 hours. Thyroid  Function Tests: No results for input(s): TSH, T4TOTAL, FREET4, T3FREE, THYROIDAB in the last 72 hours. Anemia Panel: No results for input(s): VITAMINB12, FOLATE, FERRITIN, TIBC, IRON, RETICCTPCT in the last 72 hours. Urine analysis:    Component Value Date/Time   BILIRUBINUR negative 07/02/2012 1528   PROTEINUR negative 07/02/2012 1528   UROBILINOGEN 0.2 07/02/2012 1528   NITRITE negative 07/02/2012 1528   LEUKOCYTESUR Negative 07/02/2012 1528   Radiological Exams on Admission: CT Angio Chest PE W and/or Wo Contrast Addendum Date: 02/18/2024 ** ADDENDUM #1 ** RESULTS WERE DISCUSSED WITH DR. CLEOTIS BY DR. MAPLE AT 06:15 PM ON 02/18/2024. Electronically signed by: Greig MAPLE MD 02/18/2024 06:16 PM EDT RP Workstation: [id number]   Result Date: 02/18/2024 ** ORIGINAL REPORT ** EXAM: CTA CHEST 02/18/2024 05:43:33 PM TECHNIQUE: CTA of the chest was performed without and with the administration of 75 mL of intravenous iohexol (OMNIPAQUE) 350 MG/ML injection. Multiplanar reformatted images are provided for review. MIP images are provided for review. Automated exposure control, iterative reconstruction, and/or weight based adjustment of the mA/kV was utilized to reduce the radiation dose to as low as reasonably achievable. COMPARISON: Chest CT 04/05/2021. CLINICAL HISTORY: Pulmonary embolism (PE) suspected, high  prob; Positive for DVT in right leg, shortness of breath today and yesterday with exertion. Rule out PE. Pt from PCP caox4 ambulatory NAD c/o pain and swelling in R lower leg since yesterday morning, PCP told pt to come to ED for DVT and PE r/o. Denies SOB and CP. FINDINGS: PULMONARY ARTERIES: Pulmonary arteries are adequately opacified for evaluation. Pulmonary emboli are seen within the distal right main pulmonary artery extending into a lobar, segmental and subsegmental branches of the right lower lobe. There are segmental right upper lobe pulmonary emboli as well. Main pulmonary artery is normal in caliber. MEDIASTINUM: The heart and pericardium demonstrate no acute abnormality. There is no acute abnormality of the thoracic aorta. There is a small hiatal hernia.  LYMPH NODES: There is a prominent right hilar lymph node measuring 9 mm. No mediastinal or axillary lymphadenopathy. LUNGS AND PLEURA: Atelectatic changes in the lung bases. No focal consolidation or pulmonary edema. No evidence of pleural effusion or pneumothorax. UPPER ABDOMEN: Limited images of the upper abdomen are unremarkable. SOFT TISSUES AND BONES: There is scarring and surgical clips in the left breast with some coarse calcifications. No acute bone abnormality. IMPRESSION: 1. Pulmonary emboli within the distal right main pulmonary artery extending into lobar, segmental, and subsegmental branches of the right lower lobe, as well as segmental right upper lobe pulmonary emboli. No evidence for right heart strain. Electronically signed by: Greig Pique MD 02/18/2024 06:07 PM EDT RP Workstation: [id number]   US  Venous Img Lower Unilateral Right (DVT) Result Date: 02/18/2024 CLINICAL DATA:  Right calf pain. EXAM: RIGHT LOWER EXTREMITY VENOUS DOPPLER ULTRASOUND TECHNIQUE: Gray-scale sonography with graded compression, as well as color Doppler and duplex ultrasound were performed to evaluate the lower extremity deep venous systems from the level of  the common femoral vein and including the common femoral, femoral, profunda femoral, popliteal and calf veins including the posterior tibial, peroneal and gastrocnemius veins when visible. Spectral Doppler was utilized to evaluate flow at rest and with distal augmentation maneuvers in the common femoral, femoral and popliteal veins. COMPARISON:  None Available. FINDINGS: Contralateral Common Femoral Vein: No evidence of thrombus. Normal compressibility and color Doppler flow. Common Femoral Vein: Positive for thrombus. Nonobstructive echogenic thrombus in the right common femoral vein. Saphenofemoral Junction: No evidence of thrombus. Normal compressibility and flow on color Doppler imaging. Profunda Femoral Vein: No evidence of thrombus. Normal compressibility and flow on color Doppler imaging. Femoral Vein: Positive for thrombus. Femoral vein is not compressible and contains echogenic thrombus. Popliteal Vein: Positive for thrombus. Popliteal vein is not compressible and contains echogenic thrombus. Popliteal thrombus is occlusive. Calf Veins: Limited evaluation but there is evidence for thrombus involving posterior tibial and peroneal veins. Other: Great saphenous vein is compressible without thrombus. IMPRESSION: Positive for deep venous thrombosis in the right lower extremity. Thrombus involving the right common femoral vein, right femoral vein, right popliteal vein and right calf veins. These results were called by telephone at the time of interpretation on 02/18/2024 at 2:45 pm to provider [facility] , who verbally acknowledged these results. Electronically Signed   By: Juliene Balder M.D.   On: 02/18/2024 14:45   Data Reviewed: Relevant notes from primary care and specialist visits, past discharge summaries as available in EHR, including Care Everywhere . Prior diagnostic testing as pertinent to current admission diagnoses, Updated medications and problem lists for reconciliation .ED course, including  vitals, labs, imaging, treatment and response to treatment,Triage notes, nursing and pharmacy notes and ED provider's notes.Notable results as noted in HPI.Discussed case with EDMD/ ED APP/ or Specialty MD on call and as needed.  Assessment & Plan  >>VTE: Right leg DVT and Right lung PE: Suspect VTE secondary to patient's history of cancer. Will get patient on heparin drip obtain 2D echocardiogram with bubble study. 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  >> GERD/Schatzki's ring: IV PPI aspiration precaution.   >> Essential hypertension: Vitals:   02/19/24 0200 02/19/24 0300 02/19/24 0330 02/19/24 0400  BP: (!) 153/76 138/69 (!) 152/75 (!) 147/84   02/19/24 0430  02/19/24 0500 02/19/24 0700 02/19/24 0800  BP: 132/63 138/70 (!) 146/81 (!) 144/56   02/19/24 1000 02/19/24 1030 02/19/24 1200 02/19/24 1256  BP: (!) 154/77 (!) 157/69 (!) 150/77 (!) 166/81  Med rec is pending I do not see any antihypertensive agents we will start patient on as needed hydralazine.   >> Hyperlipidemia: Continue her rosuvastatin .   >> CKD stage IIIa: Lab Results  Component Value Date   CREATININE 0.85 02/18/2024   CREATININE 0.96 01/22/2024   CREATININE 0.96 01/23/2021      DVT prophylaxis:  Heparin GTT Consults:  None  Advance Care Planning:    Code Status: Full Code   Family Communication:  None Disposition Plan:  Home Severity of Illness: The appropriate patient status for this patient is OBSERVATION. Observation status is judged to be reasonable and necessary in order to provide the required intensity of service to ensure the patient's safety. The patient's presenting symptoms, physical exam findings, and initial radiographic and laboratory data in the context of their medical condition is felt to  place them at decreased risk for further clinical deterioration. Furthermore, it is anticipated that the patient will be medically stable for discharge from the hospital within 2 midnights of admission.   Unresulted Labs (From admission, onward)     Start     Ordered   02/20/24 0500  Heparin level (unfractionated)  Daily,   R      02/18/24 1945   02/20/24 0500  CBC  Daily,   R      02/19/24 1436   02/20/24 0500  Comprehensive metabolic panel  Tomorrow morning,   R        02/19/24 1439   02/19/24 1438  Magnesium   Add-on,   AD        02/19/24 1439   02/19/24 1437  Hemoglobin A1c  Add-on,   AD        02/19/24 1439            Meds ordered this encounter  Medications   iohexol (OMNIPAQUE) 350 MG/ML injection 75 mL   heparin ADULT infusion 100 units/mL (25000 units/250mL)   heparin bolus via infusion 5,000 Units   acetaminophen  (TYLENOL ) tablet 1,000 mg   acetaminophen  (TYLENOL ) tablet 650 mg   DISCONTD: acetaminophen  (TYLENOL ) 325 MG tablet    Cecille Chew M: cabinet override   sodium chloride  flush (NS) 0.9 % injection 3 mL   morphine (PF) 2 MG/ML injection 2 mg   hydrALAZINE (APRESOLINE) injection 5 mg     Orders Placed This Encounter  Procedures   US  Venous Img Lower Unilateral Right (DVT)   CT Angio Chest PE W and/or Wo Contrast   CBC with Differential   Comprehensive metabolic panel   Pro Brain natriuretic peptide   Heparin level (unfractionated)   Heparin level (unfractionated)   Heparin level (unfractionated)   CBC   Comprehensive metabolic panel   Hemoglobin A1c   Magnesium    Diet Heart Room service appropriate? Yes; Fluid consistency: Thin   Cardiac Monitoring Continuous x 24 hours Indications for use: Other; other indications for use: High risk   Maintain IV access   Vital signs   Notify physician (specify)   Mobility Protocol: No Restrictions   Refer to Sidebar Report Mobility Protocol for Adult Inpatient   Initiate Adult Central Line Maintenance and  Catheter Protocol for patients with central line (CVC, PICC, Port, Hemodialysis, Trialysis)   Daily weights   Intake and Output   Initiate CHG Protocol  Do not place and if present remove PureWick   Initiate Oral Care Protocol   Initiate Carrier Fluid Protocol   RN may order General Admission PRN Orders utilizing General Admission PRN medications (through manage orders) for the following patient needs: allergy symptoms (Claritin), cold sores (Carmex), cough (Robitussin DM), eye irritation (Liquifilm Tears), hemorrhoids (Tucks), indigestion (Maalox), minor skin irritation (Hydrocortisone Cream), muscle pain Lucienne Gay), nose irritation (saline nasal spray) and sore throat (Chloraseptic spray).   Patient has an active order for admit to inpatient/place in observation   Ambulate with assistance   Full code   heparin per pharmacy consult   Consult to hospitalist   Pulse oximetry check with vital signs   Oxygen therapy Mode or (Route): Nasal cannula; Liters Per Minute: 2; Keep O2 saturation between: greater than 92 %   ED EKG   EKG   EKG   ECHOCARDIOGRAM COMPLETE BUBBLE STUDY   Place in observation (patient's expected length of stay will be less than 2 midnights)   Aspiration precautions   Fall precautions    Author: Mario LULLA Blanch, MD 12 pm- 8 pm. [facility]. 02/19/2024 2:39 PM Please note for any communication after hours contact [facility] Assigned provider on call on Amion.

## 2024-02-19 NOTE — ED Notes (Signed)
 Additional pill of Tylenol  pulled due to the Pt losing a pill... Pt found original pill and took that pill... Additional pill placed back in the Med room.

## 2024-02-19 NOTE — Progress Notes (Signed)
 ANTICOAGULATION CONSULT NOTE Pharmacy Consult for Heparin Indication: pulmonary embolus Brief A/P: Heparin level within goal range Continue Heparin at current rate   Allergies  Allergen Reactions   Lisinopril  Swelling    SOB & swelling    Amlodipine  Swelling    Swelling face   Anastrozole  Hives and Other (See Comments)   Flagyl  [Metronidazole ] Nausea Only   Nsaids Other (See Comments)    CKD - per PCP  Other reaction(s): Other (See Comments)  CKD per PCP  Other Reaction(s): Not available   Prednisone  Nausea And Vomiting   Tetracycline Hcl Other (See Comments)    Patient Measurements: Height: 5' 2 (157.5 cm) Weight: 82.6 kg (182 lb) IBW/kg (Calculated) : 50.1 Heparin Dosing Weight: 68.6 kg  Vital Signs: Temp: 98.9 F (37.2 C) (10/17 0553) Temp Source: Oral (10/17 0553) BP: 138/70 (10/17 0500) Pulse Rate: 87 (10/17 0500)  Labs: Recent Labs    02/18/24 1604 02/19/24 0553  HGB 14.6  --   HCT 42.8  --   PLT 182  --   HEPARINUNFRC  --  0.70  CREATININE 0.85  --     Estimated Creatinine Clearance: 54.3 mL/min (by C-G formula based on SCr of 0.85 mg/dL).  Assessment: 78 y.o. female with PE/DVT for heparin  Goal of Therapy:  Heparin level 0.3-0.7 units/ml Monitor platelets by anticoagulation protocol: Yes   Plan:  No change to heparin for now Check heparin level in 6 hours  Cathlyn Arrant, PharmD, BCPS

## 2024-02-20 ENCOUNTER — Observation Stay (HOSPITAL_COMMUNITY)

## 2024-02-20 DIAGNOSIS — I129 Hypertensive chronic kidney disease with stage 1 through stage 4 chronic kidney disease, or unspecified chronic kidney disease: Secondary | ICD-10-CM | POA: Diagnosis present

## 2024-02-20 DIAGNOSIS — Z8041 Family history of malignant neoplasm of ovary: Secondary | ICD-10-CM | POA: Diagnosis not present

## 2024-02-20 DIAGNOSIS — I824Z1 Acute embolism and thrombosis of unspecified deep veins of right distal lower extremity: Secondary | ICD-10-CM | POA: Diagnosis not present

## 2024-02-20 DIAGNOSIS — I82401 Acute embolism and thrombosis of unspecified deep veins of right lower extremity: Secondary | ICD-10-CM | POA: Diagnosis not present

## 2024-02-20 DIAGNOSIS — I2782 Chronic pulmonary embolism: Secondary | ICD-10-CM | POA: Diagnosis not present

## 2024-02-20 DIAGNOSIS — R0602 Shortness of breath: Secondary | ICD-10-CM | POA: Diagnosis present

## 2024-02-20 DIAGNOSIS — Z889 Allergy status to unspecified drugs, medicaments and biological substances status: Secondary | ICD-10-CM | POA: Diagnosis not present

## 2024-02-20 DIAGNOSIS — G629 Polyneuropathy, unspecified: Secondary | ICD-10-CM | POA: Diagnosis present

## 2024-02-20 DIAGNOSIS — Z820 Family history of epilepsy and other diseases of the nervous system: Secondary | ICD-10-CM | POA: Diagnosis not present

## 2024-02-20 DIAGNOSIS — K219 Gastro-esophageal reflux disease without esophagitis: Secondary | ICD-10-CM | POA: Diagnosis present

## 2024-02-20 DIAGNOSIS — G2581 Restless legs syndrome: Secondary | ICD-10-CM | POA: Diagnosis present

## 2024-02-20 DIAGNOSIS — I2699 Other pulmonary embolism without acute cor pulmonale: Secondary | ICD-10-CM | POA: Diagnosis present

## 2024-02-20 DIAGNOSIS — Z8249 Family history of ischemic heart disease and other diseases of the circulatory system: Secondary | ICD-10-CM | POA: Diagnosis not present

## 2024-02-20 DIAGNOSIS — Z923 Personal history of irradiation: Secondary | ICD-10-CM | POA: Diagnosis not present

## 2024-02-20 DIAGNOSIS — Z79899 Other long term (current) drug therapy: Secondary | ICD-10-CM | POA: Diagnosis not present

## 2024-02-20 DIAGNOSIS — N1831 Chronic kidney disease, stage 3a: Secondary | ICD-10-CM | POA: Diagnosis present

## 2024-02-20 DIAGNOSIS — Z853 Personal history of malignant neoplasm of breast: Secondary | ICD-10-CM | POA: Diagnosis not present

## 2024-02-20 DIAGNOSIS — K222 Esophageal obstruction: Secondary | ICD-10-CM | POA: Diagnosis present

## 2024-02-20 DIAGNOSIS — Z886 Allergy status to analgesic agent status: Secondary | ICD-10-CM | POA: Diagnosis not present

## 2024-02-20 DIAGNOSIS — Z888 Allergy status to other drugs, medicaments and biological substances status: Secondary | ICD-10-CM | POA: Diagnosis not present

## 2024-02-20 DIAGNOSIS — E785 Hyperlipidemia, unspecified: Secondary | ICD-10-CM | POA: Diagnosis present

## 2024-02-20 DIAGNOSIS — I824Y1 Acute embolism and thrombosis of unspecified deep veins of right proximal lower extremity: Secondary | ICD-10-CM | POA: Diagnosis present

## 2024-02-20 DIAGNOSIS — Z825 Family history of asthma and other chronic lower respiratory diseases: Secondary | ICD-10-CM | POA: Diagnosis not present

## 2024-02-20 DIAGNOSIS — Z803 Family history of malignant neoplasm of breast: Secondary | ICD-10-CM | POA: Diagnosis not present

## 2024-02-20 DIAGNOSIS — Z800 Family history of malignant neoplasm of digestive organs: Secondary | ICD-10-CM | POA: Diagnosis not present

## 2024-02-20 LAB — COMPREHENSIVE METABOLIC PANEL WITH GFR
ALT: 17 U/L (ref 0–44)
AST: 24 U/L (ref 15–41)
Albumin: 3.1 g/dL — ABNORMAL LOW (ref 3.5–5.0)
Alkaline Phosphatase: 46 U/L (ref 38–126)
Anion gap: 12 (ref 5–15)
BUN: 9 mg/dL (ref 8–23)
CO2: 21 mmol/L — ABNORMAL LOW (ref 22–32)
Calcium: 9.6 mg/dL (ref 8.9–10.3)
Chloride: 106 mmol/L (ref 98–111)
Creatinine, Ser: 0.83 mg/dL (ref 0.44–1.00)
GFR, Estimated: >60 mL/min (ref >60)
Glucose, Bld: 123 mg/dL — ABNORMAL HIGH (ref 70–99)
Potassium: 3.5 mmol/L (ref 3.5–5.1)
Sodium: 139 mmol/L (ref 135–145)
Total Bilirubin: 0.6 mg/dL (ref 0.0–1.2)
Total Protein: 6.7 g/dL (ref 6.5–8.1)

## 2024-02-20 LAB — CBC
HCT: 41 % (ref 36.0–46.0)
Hemoglobin: 14.1 g/dL (ref 12.0–15.0)
MCH: 32 pg (ref 26.0–34.0)
MCHC: 34.4 g/dL (ref 30.0–36.0)
MCV: 93 fL (ref 80.0–100.0)
Platelets: 215 K/uL (ref 150–400)
RBC: 4.41 MIL/uL (ref 3.87–5.11)
RDW: 12.2 % (ref 11.5–15.5)
WBC: 6.2 K/uL (ref 4.0–10.5)
nRBC: 0 % (ref 0.0–0.2)

## 2024-02-20 LAB — ECHOCARDIOGRAM COMPLETE BUBBLE STUDY
AR max vel: 1.99 cm2
AV Area VTI: 1.92 cm2
AV Area mean vel: 1.9 cm2
AV Mean grad: 3 mmHg
AV Peak grad: 5.8 mmHg
Ao pk vel: 1.2 m/s
Area-P 1/2: 3.72 cm2
Calc EF: 64.1 %
S' Lateral: 2.3 cm
Single Plane A2C EF: 63 %
Single Plane A4C EF: 64.3 %

## 2024-02-20 LAB — HEPARIN LEVEL (UNFRACTIONATED): Heparin Unfractionated: 0.62 [IU]/mL (ref 0.30–0.70)

## 2024-02-20 MED ORDER — MELATONIN 5 MG PO TABS
5.0000 mg | ORAL_TABLET | Freq: Once | ORAL | Status: AC
  Administered 2024-02-20: 5 mg via ORAL
  Filled 2024-02-20: qty 1

## 2024-02-20 MED ORDER — CALCIUM CARBONATE ANTACID 500 MG PO CHEW: 1.0000 | CHEWABLE_TABLET | Freq: Four times a day (QID) | ORAL | Status: DC | PRN

## 2024-02-20 MED ORDER — MAGNESIUM GLUCONATE 500 (27 MG) MG PO TABS
500.0000 mg | ORAL_TABLET | Freq: Once | ORAL | Status: AC
  Administered 2024-02-20: 500 mg via ORAL
  Filled 2024-02-20: qty 1

## 2024-02-20 NOTE — Progress Notes (Signed)
  Progress Note   Patient: Meghan [name] FMW:[id number] DOB: 1945/10/27 DOA: 02/18/2024     0 DOS: the patient was seen and examined on 02/20/2024   Brief hospital course: ATISHA HAMIDI is a 78 y.o. female with past medical history  of allergies to multiple meds i.e. lisinopril , amlodipine , anastrozole , Flagyl , NSAIDs, prednisone , tetracycline,Breast cancer, esophageal stricture/GERD, history of essential hypertension, UTI, neuropathy, restless leg syndrome presented yesterday to [facility] for right leg pain that has been going on for about 2 to 3 days pain is nonradiating intermittent unresolving associated with swelling..  Patient also had reporting swelling with the pain, patient also had shortness of breath that she noted.  No reports of chest pain palpitation nausea vomiting wheezing fevers or chills.   Assessment and Plan:   1.  VTE: Right leg DVT/right lung PE - Started on heparin drip, continue with that at this point until tomorrow morning - Echo pending - No hypoxemia - Once echo result is available, we will switch to oral Eliquis in the morning 10 mg twice daily for a week and then 5 mg twice daily. - Discussed with the patient. - She agreed with the plan.  2.  GERD/Schatzki's ring - Continue PPI - Aspiration precaution  3.  HTN/HLD - Continue to monitor blood pressure, patient is on as needed hydralazine - Continue Crestor   4.  CKD stage IIIa - Continue to monitor kidney function       Subjective: Has not chest pain or shortness of breath  Physical Exam: Vitals:   02/19/24 2114 02/20/24 0049 02/20/24 0510 02/20/24 0910  BP: (!) 157/79 (!) 148/84 138/72 (!) 141/84  Pulse: 82 81 80 80  Resp: 16 16 18 18   Temp: 98.1 F (36.7 C) 98.3 F (36.8 C) 98 F (36.7 C) 98 F (36.7 C)  TempSrc: Oral Oral Oral Oral  SpO2: 98% 99% 97% 96%  Weight:   80.5 kg   Height:       Constitutional: Alert, awake, calm, comfortable HEENT: Neck supple Respiratory: clear to  auscultation bilaterally, no wheezing, no crackles. Normal respiratory effort. No accessory muscle use.  Cardiovascular: Regular rate and rhythm, no murmurs / rubs / gallops. No extremity edema. 2+ pedal pulses. No carotid bruits.  Abdomen: no tenderness, no masses palpated. No hepatosplenomegaly. Bowel sounds positive.  Musculoskeletal: no clubbing / cyanosis. No joint deformity upper and lower extremities. Good ROM, no contractures. Normal muscle tone.  Skin: no rashes, lesions, ulcers. No induration Neurologic: CN 2-12 grossly intact. Sensation intact, DTR normal. Strength 5/5 x all 4 extremities.  Psychiatric: Normal judgment and insight. Alert and oriented x 3. Normal mood.   Data Reviewed:  Reviewed  Family Communication: None  Disposition: Status is: Observation The patient remains OBS appropriate and will d/c before 2 midnights.  Planned Discharge Destination: Home    Time spent: 35 minutes  Author: Kohl Polinsky, MD 02/20/2024 12:12 PM  For on call review [email or web address].

## 2024-02-20 NOTE — Progress Notes (Signed)
 ANTICOAGULATION CONSULT NOTE Pharmacy Consult for Heparin Indication: pulmonary embolus  Allergies  Allergen Reactions   Lisinopril  Swelling    SOB & swelling    Amlodipine  Swelling    Swelling face   Anastrozole  Hives and Other (See Comments)   Flagyl  [Metronidazole ] Nausea Only   Nsaids Other (See Comments)    CKD - per PCP  Other reaction(s): Other (See Comments)  CKD per PCP  Other Reaction(s): Not available   Prednisone  Nausea And Vomiting   Tetracycline Hcl Other (See Comments)    Patient Measurements: Height: 5' 2 (157.5 cm) Weight: 80.5 kg (177 lb 6.4 oz) IBW/kg (Calculated) : 50.1 Heparin Dosing Weight: 68.6 kg  Vital Signs: Temp: 98 F (36.7 C) (10/18 0510) Temp Source: Oral (10/18 0510) BP: 138/72 (10/18 0510) Pulse Rate: 80 (10/18 0510)  Labs: Recent Labs    02/18/24 1604 02/19/24 0553 02/19/24 1401 02/20/24 0446  HGB 14.6  --   --  14.1  HCT 42.8  --   --  41.0  PLT 182  --   --  215  HEPARINUNFRC  --  0.70 0.65 0.62  CREATININE 0.85  --   --  0.83    Estimated Creatinine Clearance: 54.9 mL/min (by C-G formula based on SCr of 0.83 mg/dL).  Assessment: 78 y.o. female presenting with SOB and leg swelling. Pharmacy consulted for heparin for PE/DVT.   Heparin level today is therapeutic at 0.62. No signs or symptoms of bleeding or interruptions to the infusion per RN. CBC stable: Hgb 14.1 and plt: 215.  Goal of Therapy:  Heparin level 0.3-0.7 units/ml Monitor platelets by anticoagulation protocol: Yes   Plan:  Continue Heparin at 1250 units/h Daily heparin level and CBC Monitor for signs and symptoms of bleeding  Elma Fail, PharmD PGY1 Clinical Pharmacist [facility]  02/20/2024 7:06 AM

## 2024-02-20 NOTE — Progress Notes (Signed)
  Echocardiogram 2D Echocardiogram has been performed.  [name] [facility] 02/20/2024, 3:34 PM

## 2024-02-21 ENCOUNTER — Other Ambulatory Visit (HOSPITAL_COMMUNITY): Payer: Self-pay

## 2024-02-21 DIAGNOSIS — I824Z1 Acute embolism and thrombosis of unspecified deep veins of right distal lower extremity: Secondary | ICD-10-CM | POA: Diagnosis not present

## 2024-02-21 LAB — HEPARIN LEVEL (UNFRACTIONATED): Heparin Unfractionated: 0.6 [IU]/mL (ref 0.30–0.70)

## 2024-02-21 LAB — CBC
HCT: 41 % (ref 36.0–46.0)
Hemoglobin: 14 g/dL (ref 12.0–15.0)
MCH: 32.2 pg (ref 26.0–34.0)
MCHC: 34.1 g/dL (ref 30.0–36.0)
MCV: 94.3 fL (ref 80.0–100.0)
Platelets: 229 K/uL (ref 150–400)
RBC: 4.35 MIL/uL (ref 3.87–5.11)
RDW: 12.4 % (ref 11.5–15.5)
WBC: 6.9 K/uL (ref 4.0–10.5)
nRBC: 0 % (ref 0.0–0.2)

## 2024-02-21 MED ORDER — APIXABAN 5 MG PO TABS
5.0000 mg | ORAL_TABLET | Freq: Two times a day (BID) | ORAL | 1 refills | Status: DC
  Filled 2024-02-21: qty 60, 30d supply, fill #0

## 2024-02-21 MED ORDER — APIXABAN 5 MG PO TABS
10.0000 mg | ORAL_TABLET | Freq: Two times a day (BID) | ORAL | Status: DC
Start: 2024-02-21 — End: 2024-02-21
  Administered 2024-02-21: 10 mg via ORAL
  Filled 2024-02-21: qty 2

## 2024-02-21 MED ORDER — APIXABAN 5 MG PO TABS
ORAL_TABLET | ORAL | 0 refills | Status: DC
  Filled 2024-02-21: qty 74, 30d supply, fill #0

## 2024-02-21 MED ORDER — APIXABAN 5 MG PO TABS: 5.0000 mg | ORAL_TABLET | Freq: Two times a day (BID) | ORAL | Status: DC

## 2024-02-21 NOTE — Plan of Care (Signed)
  Problem: Education: Goal: Knowledge of General Education information will improve Description: Including pain rating scale, medication(s)/side effects and non-pharmacologic comfort measures 02/21/2024 1030 by Dallie Waddell SAUNDERS, RN Outcome: Adequate for Discharge 02/21/2024 1030 by Dallie Waddell SAUNDERS, RN Outcome: Adequate for Discharge   Problem: Health Behavior/Discharge Planning: Goal: Ability to manage health-related needs will improve 02/21/2024 1030 by Dallie Waddell SAUNDERS, RN Outcome: Adequate for Discharge 02/21/2024 1030 by Dallie Waddell SAUNDERS, RN Outcome: Adequate for Discharge   Problem: Clinical Measurements: Goal: Ability to maintain clinical measurements within normal limits will improve 02/21/2024 1030 by Dallie Waddell SAUNDERS, RN Outcome: Adequate for Discharge 02/21/2024 1030 by Dallie Waddell SAUNDERS, RN Outcome: Adequate for Discharge Goal: Will remain free from infection 02/21/2024 1030 by Dallie Waddell SAUNDERS, RN Outcome: Adequate for Discharge 02/21/2024 1030 by Dallie Waddell SAUNDERS, RN Outcome: Adequate for Discharge Goal: Diagnostic test results will improve 02/21/2024 1030 by Dallie Waddell SAUNDERS, RN Outcome: Adequate for Discharge 02/21/2024 1030 by Dallie Waddell SAUNDERS, RN Outcome: Adequate for Discharge Goal: Respiratory complications will improve 02/21/2024 1030 by Dallie Waddell SAUNDERS, RN Outcome: Adequate for Discharge 02/21/2024 1030 by Dallie Waddell SAUNDERS, RN Outcome: Adequate for Discharge Goal: Cardiovascular complication will be avoided 02/21/2024 1030 by Dallie Waddell SAUNDERS, RN Outcome: Adequate for Discharge 02/21/2024 1030 by Dallie Waddell SAUNDERS, RN Outcome: Adequate for Discharge   Problem: Activity: Goal: Risk for activity intolerance will decrease 02/21/2024 1030 by Dallie Waddell SAUNDERS, RN Outcome: Adequate for Discharge 02/21/2024 1030 by Dallie Waddell SAUNDERS, RN Outcome: Adequate for Discharge   Problem: Nutrition: Goal: Adequate nutrition will be maintained 02/21/2024 1030 by Dallie Waddell SAUNDERS,  RN Outcome: Adequate for Discharge 02/21/2024 1030 by Dallie Waddell SAUNDERS, RN Outcome: Adequate for Discharge   Problem: Coping: Goal: Level of anxiety will decrease 02/21/2024 1030 by Dallie Waddell SAUNDERS, RN Outcome: Adequate for Discharge 02/21/2024 1030 by Dallie Waddell SAUNDERS, RN Outcome: Adequate for Discharge   Problem: Elimination: Goal: Will not experience complications related to bowel motility 02/21/2024 1030 by Dallie Waddell SAUNDERS, RN Outcome: Adequate for Discharge 02/21/2024 1030 by Dallie Waddell SAUNDERS, RN Outcome: Adequate for Discharge Goal: Will not experience complications related to urinary retention 02/21/2024 1030 by Dallie Waddell SAUNDERS, RN Outcome: Adequate for Discharge 02/21/2024 1030 by Dallie Waddell SAUNDERS, RN Outcome: Adequate for Discharge   Problem: Pain Managment: Goal: General experience of comfort will improve and/or be controlled 02/21/2024 1030 by Dallie Waddell SAUNDERS, RN Outcome: Adequate for Discharge 02/21/2024 1030 by Dallie Waddell SAUNDERS, RN Outcome: Adequate for Discharge   Problem: Safety: Goal: Ability to remain free from injury will improve 02/21/2024 1030 by Dallie Waddell SAUNDERS, RN Outcome: Adequate for Discharge 02/21/2024 1030 by Dallie Waddell SAUNDERS, RN Outcome: Adequate for Discharge   Problem: Skin Integrity: Goal: Risk for impaired skin integrity will decrease 02/21/2024 1030 by Dallie Waddell SAUNDERS, RN Outcome: Adequate for Discharge 02/21/2024 1030 by Dallie Waddell SAUNDERS, RN Outcome: Adequate for Discharge

## 2024-02-21 NOTE — Discharge Summary (Signed)
 Physician Discharge Summary  Meghan [name] FMW:[id number] DOB: May 07, 1945 DOA: 02/18/2024  PCP: Katina Pfeiffer, PA-C  Admit date: 02/18/2024 Discharge date: 02/21/2024  Admitted From: Home  Discharge disposition: Home   Recommendations for Outpatient Follow-Up:   Follow up with your primary care provider in one week.  Check CBC, BMP, magnesium  in the next visit Hypercoagulable workup could be done as outpatient/history of breast cancer in the past please decide on length of anticoagulation as outpatient.   Discharge Diagnosis:   Principal Problem:   Acute deep vein thrombosis (DVT) (HCC) Pulmonary embolism  Discharge Condition: Improved.  Diet recommendation: Low sodium, heart healthy.    Wound care: None.  Code status: Full.   History of Present Illness:   Meghan [name] is a 78 y.o. female with past medical history  of allergies to multiple meds ,Breast cancer in remission as per the patient, esophageal stricture/GERD, essential hypertension, neuropathy, restless leg syndrome presented to the hospital with right leg pain for 2 to 3 days with some swelling and shortness of breath but no chest pain.  Patient was seen and admitted hospital for further evaluation and treatment.   Hospital Course:   Following conditions were addressed during hospitalization as listed below,    Right leg DVT with right sided pulmonary embolism. Looks like unprovoked DVT.  2D echocardiogram showed LV ejection fraction of 65 to 70% with no regional wall motion abnormality and grade 1 diastolic dysfunction.  Right ventricular systolic function normal without RV strain.  Patient without hypoxemia.  She was initially on heparin drip which has been switched to Eliquis at this time.  Patient was advised to continue Eliquis on discharge and take precautions while on blood thinners.  She was advised to hold her turmeric and herbal supplements while on blood thinners and seek medical attention  if any bleeding.  Patient will need to follow-up with PCP to discuss further regarding hypercoagulable workup/history of breast cancer and to decide on length of anticoagulation   GERD/Schatzki's ring Continue PPI   Essential hypertension. Not on medications.  Closely monitor.  Hyperlipidemia.  Continue Crestor    CKD stage IIIa Monitor BMP as outpatient.  Creatinine prior to discharge was 0.8.  History of neuropathy, restless leg syndrome.  Continue Lyrica .   Disposition.  At this time, patient is stable for disposition home with outpatient PCP follow-up.  Medical Consultants:   None.  Procedures:    None Subjective:   Today, patient was seen and examined at bedside.  Denies any chest pain, shortness of breath, dyspnea, palpitations.  Denies any leg pain.  Discharge Exam:   Vitals:   02/21/24 0401 02/21/24 0812  BP:  (!) 141/77  Pulse: 73 79  Resp: 18 18  Temp: 98.1 F (36.7 C) 97.9 F (36.6 C)  SpO2: 98% 97%   Vitals:   02/20/24 1910 02/21/24 0002 02/21/24 0401 02/21/24 0812  BP: 132/76 137/71  (!) 141/77  Pulse: 83 79 73 79  Resp: 18 18 18 18   Temp: 98.1 F (36.7 C) 98.3 F (36.8 C) 98.1 F (36.7 C) 97.9 F (36.6 C)  TempSrc: Oral Oral Oral Oral  SpO2: 96% 98% 98% 97%  Weight:      Height:       Body mass index is 32.45 kg/m.  General: Alert awake, not in obvious distress, elderly female, Communicative, HENT: pupils equally reacting to light,  No scleral pallor or icterus noted. Oral mucosa is moist.  Chest:  Clear breath sounds.  Diminished breath sounds bilaterally. No crackles or wheezes.  CVS: S1 &S2 heard. No murmur.  Regular rate and rhythm. Abdomen: Soft, nontender, nondistended.  Bowel sounds are heard.   Extremities: No cyanosis, clubbing or edema.  Peripheral pulses are palpable. Psych: Alert, awake and oriented, normal mood CNS:  No cranial nerve deficits.  Power equal in all extremities.   Skin: Warm and dry.  No rashes noted.  The  results of significant diagnostics from this hospitalization (including imaging, microbiology, ancillary and laboratory) are listed below for reference.     Diagnostic Studies:   CT Angio Chest PE W and/or Wo Contrast Addendum Date: 02/18/2024 EXAM: CTA CHEST 02/18/2024 05:43:33 PM TECHNIQUE: CTA of the chest was performed without and with the administration of 75 mL of intravenous iohexol (OMNIPAQUE) 350 MG/ML injection. Multiplanar reformatted images are provided for review. MIP images are provided for review. Automated exposure control, iterative reconstruction, and/or weight based adjustment of the mA/kV was utilized to reduce the radiation dose to as low as reasonably achievable. COMPARISON: Chest CT 04/05/2021. CLINICAL HISTORY: Pulmonary embolism (PE) suspected, high prob; Positive for DVT in right leg, shortness of breath today and yesterday with exertion. Rule out PE. Pt from PCP caox4 ambulatory NAD c/o pain and swelling in R lower leg since yesterday morning, PCP told pt to come to ED for DVT and PE r/o. Denies SOB and CP. FINDINGS: PULMONARY ARTERIES: Pulmonary arteries are adequately opacified for evaluation. Pulmonary emboli are seen within the distal right main pulmonary artery extending into a lobar, segmental and subsegmental branches of the right lower lobe. There are segmental right upper lobe pulmonary emboli as well. Main pulmonary artery is normal in caliber. MEDIASTINUM: The heart and pericardium demonstrate no acute abnormality. There is no acute abnormality of the thoracic aorta. There is a small hiatal hernia. LYMPH NODES: There is a prominent right hilar lymph node measuring 9 mm. No mediastinal or axillary lymphadenopathy. LUNGS AND PLEURA: Atelectatic changes in the lung bases. No focal consolidation or pulmonary edema. No evidence of pleural effusion or pneumothorax. UPPER ABDOMEN: Limited images of the upper abdomen are unremarkable. SOFT TISSUES AND BONES: There is scarring and  surgical clips in the left breast with some coarse calcifications. No acute bone abnormality. IMPRESSION: 1. Pulmonary emboli within the distal right main pulmonary artery extending into lobar, segmental, and subsegmental branches of the right lower lobe, as well as segmental right upper lobe pulmonary emboli. No evidence for right heart strain. Electronically signed by: Greig Pique MD 02/18/2024 06:07 PM EDT RP Workstation: [id number]   US  Venous Img Lower Unilateral Right (DVT) Result Date: 02/18/2024 CLINICAL DATA:  Right calf pain. EXAM: RIGHT LOWER EXTREMITY VENOUS DOPPLER ULTRASOUND TECHNIQUE: Gray-scale sonography with graded compression, as well as color Doppler and duplex ultrasound were performed to evaluate the lower extremity deep venous systems from the level of the common femoral vein and including the common femoral, femoral, profunda femoral, popliteal and calf veins including the posterior tibial, peroneal and gastrocnemius veins when visible. Spectral Doppler was utilized to evaluate flow at rest and with distal augmentation maneuvers in the common femoral, femoral and popliteal veins. COMPARISON:  None Available. FINDINGS: Contralateral Common Femoral Vein: No evidence of thrombus. Normal compressibility and color Doppler flow. Common Femoral Vein: Positive for thrombus. Nonobstructive echogenic thrombus in the right common femoral vein. Saphenofemoral Junction: No evidence of thrombus. Normal compressibility and flow on color Doppler imaging. Profunda Femoral Vein: No evidence of thrombus. Normal compressibility and flow on color  Doppler imaging. Femoral Vein: Positive for thrombus. Femoral vein is not compressible and contains echogenic thrombus. Popliteal Vein: Positive for thrombus. Popliteal vein is not compressible and contains echogenic thrombus. Popliteal thrombus is occlusive. Calf Veins: Limited evaluation but there is evidence for thrombus involving posterior tibial and peroneal  veins. Other: Great saphenous vein is compressible without thrombus. IMPRESSION: Positive for deep venous thrombosis in the right lower extremity. Thrombus involving the right common femoral vein, right femoral vein, right popliteal vein and right calf veins. These results were called by telephone at the time of interpretation on 02/18/2024 at 2:45 pm to provider [facility] , who verbally acknowledged these results. Electronically Signed   By: Juliene Balder M.D.   On: 02/18/2024 14:45     Labs:   Basic Metabolic Panel: Recent Labs  Lab 02/18/24 1604 02/19/24 1608 02/20/24 0446  NA 140  --  139  K 3.9  --  3.5  CL 103  --  106  CO2 25  --  21*  GLUCOSE 120*  --  123*  BUN 19  --  9  CREATININE 0.85  --  0.83  CALCIUM  11.1*  --  9.6  MG  --  1.7  --    GFR Estimated Creatinine Clearance: 54.9 mL/min (by C-G formula based on SCr of 0.83 mg/dL). Liver Function Tests: Recent Labs  Lab 02/18/24 1604 02/20/24 0446  AST 25 24  ALT 17 17  ALKPHOS 67 46  BILITOT 0.4 0.6  PROT 8.0 6.7  ALBUMIN 4.3 3.1*   No results for input(s): LIPASE, AMYLASE in the last 168 hours. No results for input(s): AMMONIA in the last 168 hours. Coagulation profile No results for input(s): INR, PROTIME in the last 168 hours.  CBC: Recent Labs  Lab 02/18/24 1604 02/20/24 0446 02/21/24 0431  WBC 7.5 6.2 6.9  NEUTROABS 4.5  --   --   HGB 14.6 14.1 14.0  HCT 42.8 41.0 41.0  MCV 95.1 93.0 94.3  PLT 182 215 229   Cardiac Enzymes: No results for input(s): CKTOTAL, CKMB, CKMBINDEX, TROPONINI in the last 168 hours. BNP: Invalid input(s): POCBNP CBG: No results for input(s): GLUCAP in the last 168 hours. D-Dimer No results for input(s): DDIMER in the last 72 hours. Hgb A1c Recent Labs    02/19/24 1608  HGBA1C 6.1*   Lipid Profile No results for input(s): CHOL, HDL, LDLCALC, TRIG, CHOLHDL, LDLDIRECT in the last 72 hours. Thyroid  function studies No  results for input(s): TSH, T4TOTAL, T3FREE, THYROIDAB in the last 72 hours.  Invalid input(s): FREET3 Anemia work up No results for input(s): VITAMINB12, FOLATE, FERRITIN, TIBC, IRON, RETICCTPCT in the last 72 hours. Microbiology No results found for this or any previous visit (from the past 240 hours).   Discharge Instructions:   Discharge Instructions     Diet - low sodium heart healthy   Complete by: As directed    Discharge instructions   Complete by: As directed    Follow up with your primary care provider in one week. Seek medical attention for worsening symptoms.   Increase activity slowly   Complete by: As directed       Allergies as of 02/21/2024       Reactions   Lisinopril  Swelling   SOB & swelling   Amlodipine  Swelling   Swelling face   Anastrozole  Hives, Other (See Comments)   Flagyl  [metronidazole ] Nausea Only   Nsaids Other (See Comments)   CKD - per PCP Other reaction(s):  Other (See Comments) CKD per PCP Other Reaction(s): Not available   Prednisone  Nausea And Vomiting   Tetracycline Hcl Other (See Comments)        Medication List     TAKE these medications    AZO Cranberry 250-30 MG Tabs Take 1 tablet by mouth 2 (two) times daily.   b complex vitamins capsule Take 1 capsule by mouth daily.   clidinium-chlordiazePOXIDE  5-2.5 MG capsule Commonly known as: LIBRAX  Take 1 capsule by mouth as needed.   Co Q-10 100 MG Caps Take 200 mg by mouth daily.   Eliquis 5 MG Tabs tablet Generic drug: apixaban Take 2 tablets (10 mg total) by mouth 2 (two) times daily for 7 days, THEN 1 tablet (5 mg total) 2 (two) times daily. Start taking on: February 21, 2024   Fish Oil 1200 MG Caps Take 1,200 mg by mouth daily.   fluticasone 50 MCG/ACT nasal spray Commonly known as: FLONASE Place 1 spray into both nostrils daily as needed for allergies or rhinitis.   GLUCOSAMINE PO Take by mouth 2 (two) times daily. 1500/1200 mg   Iron  (Ferrous Sulfate) 325 (65 Fe) MG Tabs Take 1 tablet by mouth See admin instructions. Take one tablet by mouth on Mondays and Fridays   MAGNESIUM  PO Take 240 mg by mouth daily.   multivitamin tablet Take 1 tablet by mouth every evening.   pantoprazole  40 MG tablet Commonly known as: PROTONIX  TAKE 1 TABLET DAILY   pregabalin  50 MG capsule Commonly known as: LYRICA  TAKE UP TO 2 CAPSULES AT BEDTIME FOR RESTLESS LEG SYMPTOMS   rosuvastatin  10 MG tablet Commonly known as: CRESTOR  TAKE 1 TABLET DAILY   TURMERIC PO Take 1 capsule by mouth 2 (two) times a day. 1000 mg   Vitamin D3 50 MCG (2000 UT) Tabs Take 2,000 Units by mouth daily.         Follow-up Information     Katina Pfeiffer, PA-C Follow up in 1 week(s).   Specialty: Family Medicine Contact information: [address] [place] [address] [phone]                  Time coordinating discharge: 39 minutes  Signed:  Celia Friedland  [facility] 02/21/2024, 3:34 PM

## 2024-02-21 NOTE — Progress Notes (Addendum)
 ANTICOAGULATION CONSULT NOTE Pharmacy Consult for Heparin Indication: pulmonary embolus  Allergies  Allergen Reactions   Lisinopril  Swelling    SOB & swelling    Amlodipine  Swelling    Swelling face   Anastrozole  Hives and Other (See Comments)   Flagyl  [Metronidazole ] Nausea Only   Nsaids Other (See Comments)    CKD - per PCP  Other reaction(s): Other (See Comments)  CKD per PCP  Other Reaction(s): Not available   Prednisone  Nausea And Vomiting   Tetracycline Hcl Other (See Comments)    Patient Measurements: Height: 5' 2 (157.5 cm) Weight: 80.5 kg (177 lb 6.4 oz) IBW/kg (Calculated) : 50.1 Heparin Dosing Weight: 68.6 kg  Vital Signs: Temp: 98.1 F (36.7 C) (10/19 0401) Temp Source: Oral (10/19 0401) BP: 137/71 (10/19 0002) Pulse Rate: 73 (10/19 0401)  Labs: Recent Labs    02/18/24 1604 02/19/24 0553 02/19/24 1401 02/20/24 0446 02/21/24 0431  HGB 14.6  --   --  14.1 14.0  HCT 42.8  --   --  41.0 41.0  PLT 182  --   --  215 229  HEPARINUNFRC  --    < > 0.65 0.62 0.60  CREATININE 0.85  --   --  0.83  --    < > = values in this interval not displayed.    Estimated Creatinine Clearance: 54.9 mL/min (by C-G formula based on SCr of 0.83 mg/dL).  Assessment: 78 y.o. female presenting with SOB and leg swelling. Pharmacy consulted for heparin for PE/DVT.   Heparin level is therapeutic at 0.60 on 1250 units/hr. CBC stable: Hgb 14 and plt: 229. No signs or symptoms of bleeding or interruptions to the infusion noted.  Goal of Therapy:  Heparin level 0.3-0.7 units/ml Monitor platelets by anticoagulation protocol: Yes   Plan:  Continue Heparin at 1250 units/h Daily heparin level and CBC Monitor for signs and symptoms of bleeding  Will follow-up transition to DOAC   10/19 AM update: Patient transitioning to Eliquis. Patient was therapeutic on heparin for 2 days (10/17, 10/18)  prior to starting Eliquis.   Plan:  Start apixaban 10 mg po BID x 5 more days then  5 mg BID on 10/24.  Monitor signs and symptoms of bleeding daily   B. Amon Rocher, PharmD PGY-1 Pharmacy Resident [facility] 02/21/2024 7:47 AM

## 2024-02-21 NOTE — Discharge Instructions (Signed)
 Information on my medicine - ELIQUIS (apixaban)  This medication education was reviewed with me or my healthcare representative as part of my discharge preparation.  Why was Eliquis prescribed for you? Eliquis was prescribed to treat blood clots that may have been found in the veins of your legs (deep vein thrombosis) or in your lungs (pulmonary embolism) and to reduce the risk of them occurring again.  What do You need to know about Eliquis ? The starting dose is 10 mg (two 5 mg tablets) taken TWICE daily for more 5 additional days after discharge (7 days total), then on  02/26/2024  the dose is reduced to ONE 5 mg tablet taken TWICE daily.  Eliquis may be taken with or without food.   Try to take the dose about the same time in the morning and in the evening. If you have difficulty swallowing the tablet whole please discuss with your pharmacist how to take the medication safely.  Take Eliquis exactly as prescribed and DO NOT stop taking Eliquis without talking to the doctor who prescribed the medication.  Stopping may increase your risk of developing a new blood clot.  Refill your prescription before you run out.  After discharge, you should have regular check-up appointments with your healthcare provider that is prescribing your Eliquis.    What do you do if you miss a dose? If a dose of ELIQUIS is not taken at the scheduled time, take it as soon as possible on the same day and twice-daily administration should be resumed. The dose should not be doubled to make up for a missed dose.  Important Safety Information A possible side effect of Eliquis is bleeding. You should call your healthcare provider right away if you experience any of the following: Bleeding from an injury or your nose that does not stop. Unusual colored urine (red or dark brown) or unusual colored stools (red or black). Unusual bruising for unknown reasons. A serious fall or if you hit your head (even if there is  no bleeding).  Some medicines may interact with Eliquis and might increase your risk of bleeding or clotting while on Eliquis. To help avoid this, consult your healthcare provider or pharmacist prior to using any new prescription or non-prescription medications, including herbals, vitamins, non-steroidal anti-inflammatory drugs (NSAIDs) and supplements.  This website has more information on Eliquis (apixaban): http://www.eliquis.com/eliquis/home

## 2024-02-22 ENCOUNTER — Other Ambulatory Visit (HOSPITAL_COMMUNITY): Payer: Self-pay

## 2024-02-29 DIAGNOSIS — R0902 Hypoxemia: Secondary | ICD-10-CM | POA: Diagnosis not present

## 2024-02-29 DIAGNOSIS — Z6832 Body mass index (BMI) 32.0-32.9, adult: Secondary | ICD-10-CM | POA: Diagnosis not present

## 2024-02-29 DIAGNOSIS — J302 Other seasonal allergic rhinitis: Secondary | ICD-10-CM | POA: Diagnosis not present

## 2024-02-29 DIAGNOSIS — Z853 Personal history of malignant neoplasm of breast: Secondary | ICD-10-CM | POA: Diagnosis not present

## 2024-02-29 DIAGNOSIS — I82409 Acute embolism and thrombosis of unspecified deep veins of unspecified lower extremity: Secondary | ICD-10-CM | POA: Diagnosis not present

## 2024-02-29 DIAGNOSIS — I2699 Other pulmonary embolism without acute cor pulmonale: Secondary | ICD-10-CM | POA: Diagnosis not present

## 2024-02-29 NOTE — Progress Notes (Unsigned)
 DVT Clinic Note  Name: Meghan [name]     MRN: [id number]     DOB: 12-18-1945     Sex: female  PCP: Katina Pfeiffer, PA-C  Today's Visit: Visit Information: Initial Visit  Referred to DVT Clinic by: Primary Care - Pfeiffer Katina, PA-C Referred to CPP by: Dr. Magda Reason for referral:  Chief Complaint  Patient presents with   DVT   HISTORY OF PRESENT ILLNESS: Meghan [name] is a 78 y.o. female with PMH breast cancer in 2006 and 2022, obesity, GERD, restless leg syndrome, perpheral neuropathy, CKD who presents after diagnosis of DVT for medication management. Admitted to [facility] 02/18/24 to 02/21/24 for RLE DVT involving the common femoral, femoral, popliteal, and calf veins as well as PE in the distal main pulmonary artery extending into lobar, segmental, and subsegmental branches of the right lower lobe, and segmental right upper lobe. No evidence of right heart strain. She was discharged on Eliquis. Referred to DVT Clinic by her PCP. Today patient presents to clinic ambulating independently. Reports she woke up on 02/18/24 with pain and swelling in her right calf. Also said she had been experiencing SOB for 1-2 days before that. Since starting on anticoagulation, her symptoms have resolved. She denies pain/swelling in her leg. Denies SOB. Reports adherence to Eliquis as prescribed. Denies s/sx of bleeding. Reports she had a mammogram and pap smear done this past summer which were normal. Last colonoscopy was ~2017. Reports new headaches and increased fatigue recently. Also, was taking turmeric and glucosamine for arthritis and fish oil and wonders if it is okay for her to resume this with being on Eliquis.  Positive Thrombotic Risk Factors: Older Age, Obesity Bleeding Risk Factors: Age >65 years  Negative Thrombotic Risk Factors: Recent surgery (within 3 months), Recent trauma (within 3 months), Previous VTE, Recent admission to hospital with acute illness (within 3 months), Paralysis,  paresis, or recent plaster cast immobilization of lower extremity, Central venous catheterization, Sedentary journey lasting >8 hours within 4 weeks, Bed rest >72 hours within 3 months, Within 6 weeks postpartum, Recent cesarean section (within 3 months), Estrogen therapy, Pregnancy, Recent COVID diagnosis (within 3 months), Erythropoiesis-stimulating agent, Testosterone therapy, Active cancer, Non-malignant, chronic inflammatory condition, Known thrombophilic condition, Smoking  Rx Insurance Coverage: Medicare Tricare Rx Affordability: Eliquis is $43 per 30 day supply Rx Assistance Provided: None needed Preferred Pharmacy: [facility]  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%   Cataract    REMOVED 05-2020 AND 06-2020 BILAT   Colon polyps    Hyperplastic    Diverticulosis of colon (without mention of hemorrhage)    Esophageal stricture    GERD (gastroesophageal reflux disease)    History of radiation therapy    History of urinary tract infection    Hypertension    CONTROLLED- OFF MEDS -   Lower back pain    Neuromuscular disorder (HCC)    peripheral nueropathy   Numbness    feet bilat at nighttime   Osteopenia    Personal history of radiation therapy 2006   Restless leg syndrome 03/27/2014   Tremor, 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   Head and neck tremor   Trimalleolar fracture of left ankle 08/24/2015   Urinary urgency     Past Surgical History:  Procedure Laterality Date   BREAST BIOPSY Left 01/2021   BREAST LUMPECTOMY Left 2006   BREAST LUMPECTOMY  Left 02/12/2021   BREAST LUMPECTOMY WITH RADIOACTIVE SEED LOCALIZATION Left 02/12/2021   Procedure: LEFT BREAST LUMPECTOMY WITH RADIOACTIVE SEED LOCALIZATION;  Surgeon: Vanderbilt Ned, MD;  Location: [facility];  Service: General;  Laterality: Left;   BREAST SURGERY     left 2006   CESAREAN SECTION     COLONOSCOPY      EYE SURGERY  2022   FRACTURE SURGERY  2017   ORIF ANKLE FRACTURE Left 08/24/2015   Procedure: OPEN REDUCTION INTERNAL FIXATION (ORIF) LEFT ANKLE FRACTURE;  Surgeon: Lonni CINDERELLA Poli, MD;  Location: [facility];  Service: Orthopedics;  Laterality: Left;  General with a popliteal block   POLYPECTOMY     HPP 2011    Social History   Socioeconomic History   Marital status: Married    Spouse name: Not on file   Number of children: 2   Years of education: AS   Highest education level: Not on file  Occupational History   Occupation: Teacher, Adult Education:  CARDIOLOGY   Occupation: Retired  Tobacco Use   Smoking status: Never   Smokeless tobacco: Never  Vaping Use   Vaping status: Never Used  Substance and Sexual Activity   Alcohol use: Yes    Comment: rare occ drink wine   Drug use: No   Sexual activity: Yes  Other Topics Concern   Not on file  Social History Narrative   Lives at home w/ her husband   Right-handed   Occasionally drinks tea   Social Drivers of Corporate Investment Banker Strain: Low Risk  (05/27/2021)   Overall Financial Resource Strain (CARDIA)    Difficulty of Paying Living Expenses: Not very hard  Food Insecurity: No Food Insecurity (02/19/2024)   Hunger Vital Sign    Worried About Running Out of Food in the Last Year: Never true    Ran Out of Food in the Last Year: Never true  Transportation Needs: No Transportation Needs (02/19/2024)   PRAPARE - Administrator, Civil Service (Medical): No    Lack of Transportation (Non-Medical): No  Physical Activity: Sufficiently Active (05/27/2021)   Exercise Vital Sign    Days of Exercise per Week: 3 days    Minutes of Exercise per Session: 90 min  Stress: Not on file  Social Connections: Socially Integrated (02/19/2024)   Social Connection and Isolation Panel    Frequency of Communication with Friends and Family: More than three times a week    Frequency of Social Gatherings with Friends and  Family: Three times a week    Attends Religious Services: More than 4 times per year    Active Member of Clubs or Organizations: Yes    Attends Banker Meetings: 1 to 4 times per year    Marital Status: Married  Catering Manager Violence: Not At Risk (02/19/2024)   Humiliation, Afraid, Rape, and Kick questionnaire    Fear of Current or Ex-Partner: No    Emotionally Abused: No    Physically Abused: No    Sexually Abused: No    Family History  Problem Relation Age of Onset   Heart attack Mother    Varicose Veins Mother    COPD Father    Heart disease Father    Emphysema Father        smoker   Breast cancer Sister 40       mastectomy; metastasis to liver and lung   Cancer Sister    Obesity Brother  Breast cancer Maternal Aunt        dx. late 80s   Cancer Maternal Aunt    Alzheimer's disease Maternal Aunt    Breast cancer Maternal Aunt        dx. 70s   Squamous cell carcinoma Maternal Aunt        recently removed; dx. 95   Cancer Maternal Aunt    Heart disease Maternal Grandmother    Congestive Heart Failure Maternal Grandmother    Heart disease Paternal Grandmother    Breast cancer Cousin 32       genetic testing in approx 2015   Ovarian cancer Cousin        dx. 50s   Esophageal cancer Cousin        not a smoker   Neuropathy Neg Hx    Thyroid  disease Neg Hx    Colon cancer Neg Hx    Colon polyps Neg Hx    Rectal cancer Neg Hx    Stomach cancer Neg Hx     Allergies as of 03/01/2024 - Review Complete 03/01/2024  Allergen Reaction Noted   Lisinopril  Swelling 05/24/2020   Amlodipine  Swelling 10/31/2020   Anastrozole  Hives and Other (See Comments) 04/21/1921   Flagyl  [metronidazole ] Nausea Only 12/02/2016   Nsaids Other (See Comments) 10/12/2015   Prednisone  Nausea And Vomiting 02/09/2024   Tetracycline hcl Other (See Comments) 10/03/2015    Current Outpatient Medications on File Prior to Visit  Medication Sig Dispense Refill   b complex vitamins  capsule Take 1 capsule by mouth daily.     Cholecalciferol  (VITAMIN D3) 2000 units TABS Take 2,000 Units by mouth daily.      clidinium-chlordiazePOXIDE  (LIBRAX ) 5-2.5 MG capsule Take 1 capsule by mouth as needed. 30 capsule 3   Coenzyme Q10 (CO Q-10) 100 MG CAPS Take 200 mg by mouth daily.     Cranberry-Vitamin C-Probiotic (AZO CRANBERRY) 250-30 MG TABS Take 1 tablet by mouth 2 (two) times daily.      fluticasone (FLONASE) 50 MCG/ACT nasal spray Place 1 spray into both nostrils daily as needed for allergies or rhinitis.     Iron, Ferrous Sulfate, 325 (65 Fe) MG TABS Take 1 tablet by mouth See admin instructions. Take one tablet by mouth on Mondays and Fridays     MAGNESIUM  PO Take 240 mg by mouth daily.     Multiple Vitamin (MULTIVITAMIN) tablet Take 1 tablet by mouth every evening.      pantoprazole  (PROTONIX ) 40 MG tablet TAKE 1 TABLET DAILY 90 tablet 3   pregabalin  (LYRICA ) 50 MG capsule TAKE UP TO 2 CAPSULES AT BEDTIME FOR RESTLESS LEG SYMPTOMS 180 capsule 1   rosuvastatin  (CRESTOR ) 10 MG tablet TAKE 1 TABLET DAILY 90 tablet 3   Glucosamine HCl (GLUCOSAMINE PO) Take by mouth 2 (two) times daily. 1500/1200 mg (Patient not taking: Reported on 03/01/2024)     Omega-3 Fatty Acids (FISH OIL) 1200 MG CAPS Take 1,200 mg by mouth daily.  (Patient not taking: Reported on 03/01/2024)     TURMERIC PO Take 1 capsule by mouth 2 (two) times a day. 1000 mg (Patient not taking: Reported on 03/01/2024)     No current facility-administered medications on file prior to visit.   REVIEW OF SYSTEMS:  Review of Systems  Respiratory:  Negative for shortness of breath.   Cardiovascular:  Negative for chest pain and leg swelling.  Musculoskeletal:  Negative for myalgias.   PHYSICAL EXAMINATION:  Vitals:   03/01/24 1429  BP: 130/83  Pulse: 78  SpO2: 98%  Weight: 181 lb 11.2 oz (82.4 kg)    Body mass index is 33.23 kg/m.  Physical Exam Vitals reviewed.  Pulmonary:     Effort: Pulmonary effort is  normal.  Musculoskeletal:        General: No tenderness.     Right lower leg: No edema.  Neurological:     Mental Status: She is alert.  Psychiatric:        Mood and Affect: Mood normal.    Villalta Score for Post-Thrombotic Syndrome: Pain: Absent Cramps: Absent Heaviness: Absent Paresthesia: Absent Pruritus: Absent Pretibial Edema: Absent Skin Induration: Absent Hyperpigmentation: Absent Redness: Absent Venous Ectasia: Absent Pain on calf compression: Absent Villalta Preliminary Score: 0 Is venous ulcer present?: No If venous ulcer is present and score is <15, then 15 points total are assigned: Absent Villalta Total Score: 0  LABS:  CBC     Component Value Date/Time   WBC 6.9 02/21/2024 0431   RBC 4.35 02/21/2024 0431   HGB 14.0 02/21/2024 0431   HGB 14.0 12/24/2021 0810   HGB 13.8 09/05/2009 1447   HCT 41.0 02/21/2024 0431   HCT 41.8 12/24/2021 0810   HCT 40.6 09/05/2009 1447   PLT 229 02/21/2024 0431   PLT 197 12/24/2021 0810   MCV 94.3 02/21/2024 0431   MCV 94 12/24/2021 0810   MCV 94.0 09/05/2009 1447   MCH 32.2 02/21/2024 0431   MCHC 34.1 02/21/2024 0431   RDW 12.4 02/21/2024 0431   RDW 12.3 12/24/2021 0810   RDW 13.1 09/05/2009 1447   LYMPHSABS 2.0 02/18/2024 1604   LYMPHSABS 1.6 12/24/2021 0810   LYMPHSABS 2.1 09/05/2009 1447   MONOABS 0.7 02/18/2024 1604   MONOABS 0.6 09/05/2009 1447   EOSABS 0.2 02/18/2024 1604   EOSABS 0.2 12/24/2021 0810   BASOSABS 0.0 02/18/2024 1604   BASOSABS 0.0 12/24/2021 0810   BASOSABS 0.0 09/05/2009 1447    Hepatic Function      Component Value Date/Time   PROT 6.7 02/20/2024 0446   PROT 6.7 12/21/2020 0824   ALBUMIN 3.1 (L) 02/20/2024 0446   AST 24 02/20/2024 0446   AST 24 01/23/2021 0816   ALT 17 02/20/2024 0446   ALT 19 01/23/2021 0816   ALKPHOS 46 02/20/2024 0446   BILITOT 0.6 02/20/2024 0446   BILITOT 0.6 01/23/2021 0816    Renal Function   Lab Results  Component Value Date   CREATININE 0.83  02/20/2024   CREATININE 0.85 02/18/2024   CREATININE 0.96 01/22/2024    Estimated Creatinine Clearance: 55.6 mL/min (by C-G formula based on SCr of 0.83 mg/dL).   [facility] Vascular Lab Studies:  02/18/24 US  Venous Img Lower Unilateral Right (DVT): IMPRESSION: Positive for deep venous thrombosis in the right lower extremity. Thrombus involving the right common femoral vein, right femoral vein, right popliteal vein and right calf veins.  02/18/24 CT Angio Chest PE W and/or Wo Contrast: IMPRESSION: 1. Pulmonary emboli within the distal right main pulmonary artery extending into lobar, segmental, and subsegmental branches of the right lower lobe, as well as segmental right upper lobe pulmonary emboli. No evidence for right heart strain.  ASSESSMENT: Location of DVT: Right common femoral vein Cause of DVT: unprovoked  Patient without prior history of DVT diagnosed with DVT involving the right common femoral, femoral, popliteal, and calf veins as well as PE within the distal right main pulmonary artery extending into lobar, segmental, and subsegmental branches of the right lower lobe, and segmental right upper  lobe, no evidence of right heart strain. She was admitted to [facility] 02/18/24 to 02/21/24 and discharged on Eliquis. Today, she has no RLE swelling or pain and denies SOB. She is tolerating Eliquis well. Discussed the patient with Dr. Magda, who recommended no surgical intervention given her age and that her symptoms have already significantly improved. No clear provoking factors for DVT/PE identified. Will continue Eliquis and refer her to hematology for further work up and anticoagulation management. Patient has a history of breast cancer and expressed concern for cancer given no clear provoking factors for DVT. No barriers to medication adherence identified. Counseled patient that she may resume taking her glucosamine and turmeric if desired as this significantly helps with her arthritis pain.  Discussed that turmeric can increase risk of bleeding with Eliquis so would recommend minimizing use. She said she would only use it as needed a couple days per week. Counseled that she should stop taking turmeric if she experiences any bleeding side effects and she expressed understanding. Counseled patient that she may resume fish oil if desired as well as this does not interact with Eliquis. Extensively counseled on Eliquis and all patient questions were answered.  PLAN: -Continue apixaban (Eliquis) 5 mg twice daily. -Expected duration of therapy: per hematology. Therapy started on 02/18/24. -Patient educated on purpose, proper use and potential adverse effects of apixaban (Eliquis). -Discussed importance of taking medication around the same time every day. -Advised patient of medications to avoid (NSAIDs, aspirin  doses >100 mg daily). -Educated that Tylenol  (acetaminophen ) is the preferred analgesic to lower the risk of bleeding. -Advised patient to alert all providers of anticoagulation therapy prior to starting a new medication or having a procedure. -Emphasized importance of monitoring for signs and symptoms of bleeding (abnormal bruising, prolonged bleeding, nose bleeds, bleeding from gums, discolored urine, black tarry stools). -Educated patient to present to the ED if emergent signs and symptoms of new thrombosis occur. -Counseled patient to wear compression stockings daily, removing at night.  Follow up: Referred to hematology  Izetta Henry, PharmD [facility] [phone]

## 2024-03-01 ENCOUNTER — Ambulatory Visit: Attending: Vascular Surgery | Admitting: Pharmacist

## 2024-03-01 VITALS — BP 130/83 | HR 78 | Wt 181.7 lb

## 2024-03-01 DIAGNOSIS — I82411 Acute embolism and thrombosis of right femoral vein: Secondary | ICD-10-CM | POA: Insufficient documentation

## 2024-03-01 MED ORDER — APIXABAN 5 MG PO TABS: 5.0000 mg | ORAL_TABLET | Freq: Two times a day (BID) | ORAL | 1 refills | Status: AC

## 2024-03-01 NOTE — Patient Instructions (Signed)
-  Continue apixaban (Eliquis) 5 mg twice daily. -Your refills have been sent to [facility]. You may need to call the pharmacy to ask them to fill this when you start to run low on your current supply.  -It is important to take your medication around the same time every day.  -Avoid NSAIDs like ibuprofen (Advil, Motrin) and naproxen (Aleve) as well as aspirin  doses over 100 mg daily. -Tylenol  (acetaminophen ) is the preferred over the counter pain medication to lower the risk of bleeding. -Be sure to alert all of your health care providers that you are taking an anticoagulant prior to starting a new medication or having a procedure. -Monitor for signs and symptoms of bleeding (abnormal bruising, prolonged bleeding, nose bleeds, bleeding from gums, discolored urine, black tarry stools). If you have fallen and hit your head OR if your bleeding is severe or not stopping, seek emergency care.  -Go to the emergency room if emergent signs and symptoms of new clot occur (new or worse swelling and pain in an arm or leg, shortness of breath, chest pain, fast or irregular heartbeats, lightheadedness, dizziness, fainting, coughing up blood) or if you experience a significant color change (pale or blue) in the extremity that has the DVT.  -We recommend you wear compression stockings (20-30 mmHg) as long as you are having swelling or pain. Be sure to purchase the correct size and take them off at night.   If you have any questions or need to reschedule an appointment, please call [phone]. If you are having an emergency, call 911 or present to the nearest emergency room.   What is a DVT?  -Deep vein thrombosis (DVT) is a condition in which a blood clot forms in a vein of the deep venous system which can occur in the lower leg, thigh, pelvis, arm, or neck. This condition is serious and can be life-threatening if the clot travels to the arteries of the lungs and causing a blockage (pulmonary embolism, PE). A  DVT can also damage veins in the leg, which can lead to long-term venous disease, leg pain, swelling, discoloration, and ulcers or sores (post-thrombotic syndrome).  -Treatment may include taking an anticoagulant medication to prevent more clots from forming and the current clot from growing, wearing compression stockings, and/or surgical procedures to remove or dissolve the clot.

## 2024-03-07 ENCOUNTER — Inpatient Hospital Stay

## 2024-03-07 ENCOUNTER — Inpatient Hospital Stay: Attending: Adult Health | Admitting: Hematology and Oncology

## 2024-03-07 VITALS — BP 137/72 | HR 87 | Temp 97.8°F | Resp 17 | Wt 181.8 lb

## 2024-03-07 DIAGNOSIS — Z86000 Personal history of in-situ neoplasm of breast: Secondary | ICD-10-CM | POA: Diagnosis not present

## 2024-03-07 DIAGNOSIS — I82411 Acute embolism and thrombosis of right femoral vein: Secondary | ICD-10-CM | POA: Insufficient documentation

## 2024-03-07 DIAGNOSIS — I824Z1 Acute embolism and thrombosis of unspecified deep veins of right distal lower extremity: Secondary | ICD-10-CM | POA: Diagnosis not present

## 2024-03-07 DIAGNOSIS — Z7901 Long term (current) use of anticoagulants: Secondary | ICD-10-CM | POA: Insufficient documentation

## 2024-03-07 DIAGNOSIS — I82431 Acute embolism and thrombosis of right popliteal vein: Secondary | ICD-10-CM | POA: Insufficient documentation

## 2024-03-07 DIAGNOSIS — R109 Unspecified abdominal pain: Secondary | ICD-10-CM | POA: Diagnosis not present

## 2024-03-07 DIAGNOSIS — R791 Abnormal coagulation profile: Secondary | ICD-10-CM

## 2024-03-07 DIAGNOSIS — D0512 Intraductal carcinoma in situ of left breast: Secondary | ICD-10-CM

## 2024-03-07 DIAGNOSIS — R519 Headache, unspecified: Secondary | ICD-10-CM | POA: Diagnosis not present

## 2024-03-07 NOTE — Progress Notes (Signed)
 Patient Care Team: Katina Pfeiffer, PA-C as PCP - General (Family Medicine) Vanderbilt Ned, MD as Consulting Physician (General Surgery) Dewey Rush, MD as Consulting Physician (Radiation Oncology) Jenel Carlin POUR, MD (Inactive) as Consulting Physician (Neurology) Vernetta Lonni GRADE, MD as Consulting Physician (Orthopedic Surgery) Abran Rush SAILOR, MD as Consulting Physician (Gastroenterology) Colon Shove, MD as Consulting Physician (Neurosurgery) Kate Lonni CROME, MD as Consulting Physician (Cardiology) Rosalynn LELON Ingle, MD (Inactive) as Attending Physician (Obstetrics and Gynecology) Leslee Reusing, MD as Consulting Physician (Ophthalmology) Odean Potts, MD as Consulting Physician (Hematology and Oncology)  DIAGNOSIS:  Encounter Diagnoses  Name Primary?   Ductal carcinoma in situ (DCIS) of left breast Yes   Abnormal coagulation profile    Abdominal pain, unspecified abdominal location     SUMMARY OF ONCOLOGIC HISTORY: Oncology History  Ductal carcinoma in situ (DCIS) of left breast  10/08/2004 Surgery   s/p left lumpectomy 10/08/2004 for a pT1b pN0, stage IA invasive ductal carcinoma, grade 2, estrogen and progesterone receptor positive, HER2 not amplified, with an MIB-1 of 17%             (A) status post adjuvant MammoSite radiation             (B) status post tamoxifen for 5 years   12/22/2014 Genetic Testing   Genes tested: ATM, BARD1, BRCA1, BRCA2, BRIP1, CDH1, CHEK2, FANCC, MLH1, MSH2, MSH6, NBN, PALB2, PMS2, PTEN, RAD51C, RAD51D, TP53, and XRCC2.  .  Genetic testing was normal, and did not reveal a deleterious mutation in these genes.  Additionally, no variants of uncertain significance (VUSs) were found   01/14/2021 Initial Biopsy   Diagnosis Breast, left, needle core biopsy, upper outer, X clip - DUCTAL CARCINOMA IN SITU, INTERMEDIATE GRADE WITH CALCIFICATIONS. SEE NOTE Diagnosis Note DCIS measures 0.4 cm in greatest linear dimension  PROGNOSTIC  INDICATORS Results: Estrogen Receptor: 100%, POSITIVE, STRONG STAINING INTENSITY Progesterone Receptor: 30%, POSITIVE, STRONG STAINING INTENSITY   01/23/2021 Cancer Staging   Staging form: Breast, AJCC 8th Edition - Clinical stage from 01/23/2021: Stage 0 (cTis (DCIS), cN0, cM0, ER+, PR+, HER2: Not Assessed) - Signed by Layla Sandria BROCKS, MD on 01/23/2021 Stage prefix: Initial diagnosis Nuclear grade: G2 Laterality: Left Staged by: Pathologist and managing physician Stage used in treatment planning: Yes National guidelines used in treatment planning: Yes Type of national guideline used in treatment planning: NCCN   02/12/2021 Cancer Staging   Staging form: Breast, AJCC 8th Edition - Pathologic stage from 02/12/2021: Stage 0 (pTis (DCIS), pN0, cM0, ER+, PR+) - Signed by Crawford Morna Pickle, NP on 05/25/2021 Stage prefix: Initial diagnosis   02/12/2021 Definitive Surgery   FINAL MICROSCOPIC DIAGNOSIS:   A. BREAST, LEFT, LUMPECTOMY:  - Focus of intermediate to high-grade ductal carcinoma in situ  - Negative for invasive carcinoma  - Resection margins are negative for DCIS  - Biopsy site changes  - See oncology table   B. BREAST, LEFT ADDITIONAL SUPERIOR MARGIN, EXCISION:  - Benign breast parenchyma with no specific histopathologic changes.  - Negative for carcinoma   C. BREAST, LEFT ADDITIONAL MEDIAL MARGIN, EXCISION:  - Benign breast parenchyma with no specific histopathologic changes.  - Negative for carcinoma   D. BREAST, LEFT ADDITIONAL INFERIOR MARGIN, EXCISION:  - Benign breast parenchyma with no specific histopathologic changes.  - Negative for carcinoma   E. BREAST, LEFT ADDITIONAL LATERAL MARGIN, EXCISION:  - Benign breast parenchyma with no specific histopathologic changes.  - Negative for carcinoma   F. BREAST, LEFT ADDITIONAL POSTERIOR MARGIN,  EXCISION:  - Benign breast parenchyma with no specific histopathologic changes.  - Negative for carcinoma     COMMENT:  A.  Focus of DCIS measures 0.2 cm in greatest dimension.    04/2021 - 04/2021 Anti-estrogen oral therapy   anastrozole  daily-unable to tolerate, caused dizziness     CHIEF COMPLIANT: Recent diagnosis of DVT and PE  HISTORY OF PRESENT ILLNESS: History of Present Illness Meghan [name] is a 78 year old female who presents with a recent blood clot.  She experienced a sudden onset of a blood clot without preceding travel, injury, or sedentary behavior. There is no family history of blood clots, and this is her first occurrence. She is currently on Eliquis.  She was previously on prednisone  for severe headaches, which she continues to experience occasionally. The headaches began in mid-September with severe sharp pain, but a CT scan was normal. Currently, she experiences a dull ache, particularly at night.  She denies smoking and has not been on hormone therapies, including anastrozole . She was active prior to the clot, attending classes a couple of times a week, and now finds it difficult to remain inactive. She experiences fatigue and becomes winded with activity, such as mopping the floor.     ALLERGIES:  is allergic to lisinopril , amlodipine , anastrozole , flagyl  [metronidazole ], nsaids, prednisone , and tetracycline hcl.  MEDICATIONS:  Current Outpatient Medications  Medication Sig Dispense Refill   apixaban (ELIQUIS) 5 MG TABS tablet Take 1 tablet (5 mg total) by mouth 2 (two) times daily. 180 tablet 1   b complex vitamins capsule Take 1 capsule by mouth daily.     Cholecalciferol  (VITAMIN D3) 2000 units TABS Take 2,000 Units by mouth daily.      clidinium-chlordiazePOXIDE  (LIBRAX ) 5-2.5 MG capsule Take 1 capsule by mouth as needed. 30 capsule 3   Coenzyme Q10 (CO Q-10) 100 MG CAPS Take 200 mg by mouth daily.     Cranberry-Vitamin C-Probiotic (AZO CRANBERRY) 250-30 MG TABS Take 1 tablet by mouth 2 (two) times daily.      fluticasone (FLONASE) 50 MCG/ACT nasal spray  Place 1 spray into both nostrils daily as needed for allergies or rhinitis.     Glucosamine HCl (GLUCOSAMINE PO) Take by mouth 2 (two) times daily. 1500/1200 mg     Iron, Ferrous Sulfate, 325 (65 Fe) MG TABS Take 1 tablet by mouth See admin instructions. Take one tablet by mouth on Mondays and Fridays     MAGNESIUM  PO Take 240 mg by mouth daily.     Multiple Vitamin (MULTIVITAMIN) tablet Take 1 tablet by mouth every evening.      Omega-3 Fatty Acids (FISH OIL) 1200 MG CAPS Take 1,200 mg by mouth daily.      pantoprazole  (PROTONIX ) 40 MG tablet TAKE 1 TABLET DAILY 90 tablet 3   pregabalin  (LYRICA ) 50 MG capsule TAKE UP TO 2 CAPSULES AT BEDTIME FOR RESTLESS LEG SYMPTOMS 180 capsule 1   rosuvastatin  (CRESTOR ) 10 MG tablet TAKE 1 TABLET DAILY 90 tablet 3   TURMERIC PO Take 1 capsule by mouth 2 (two) times a day. 1000 mg     No current facility-administered medications for this visit.    PHYSICAL EXAMINATION: ECOG PERFORMANCE STATUS: 1 - Symptomatic but completely ambulatory  Vitals:   03/07/24 1114  BP: 137/72  Pulse: 87  Resp: 17  Temp: 97.8 F (36.6 C)  SpO2: 99%   Filed Weights   03/07/24 1114  Weight: 181 lb 12.8 oz (82.5 kg)  LABORATORY DATA:  I have reviewed the data as listed    Latest Ref Rng & Units 02/20/2024    4:46 AM 02/18/2024    4:04 PM 01/22/2024    3:05 PM  CMP  Glucose 70 - 99 mg/dL 876  879  882   BUN 8 - 23 mg/dL 9  19  16    Creatinine 0.44 - 1.00 mg/dL 9.16  9.14  9.03   Sodium 135 - 145 mmol/L 139  140  141   Potassium 3.5 - 5.1 mmol/L 3.5  3.9  4.3   Chloride 98 - 111 mmol/L 106  103  106   CO2 22 - 32 mmol/L 21  25  24    Calcium  8.9 - 10.3 mg/dL 9.6  88.8  88.8   Total Protein 6.5 - 8.1 g/dL 6.7  8.0    Total Bilirubin 0.0 - 1.2 mg/dL 0.6  0.4    Alkaline Phos 38 - 126 U/L 46  67    AST 15 - 41 U/L 24  25    ALT 0 - 44 U/L 17  17      Lab Results  Component Value Date   WBC 6.9 02/21/2024   HGB 14.0 02/21/2024   HCT 41.0 02/21/2024   MCV  94.3 02/21/2024   PLT 229 02/21/2024   NEUTROABS 4.5 02/18/2024    ASSESSMENT & PLAN:  Ductal carcinoma in situ (DCIS) of left breast (1) s/p left lumpectomy 10/08/2004 for a pT1b pN0, stage IA invasive ductal carcinoma, grade 2, estrogen and progesterone receptor positive, HER2 not amplified, with an MIB-1 of 17%             (A) status post adjuvant MammoSite radiation             (B) status post tamoxifen for 5 years  (2) genetics testing 2006, updated 2018: No deleterious mutations  (3) status post left breast biopsy 01/14/2021 for ductal carcinoma in situ, grade 2, estrogen and progesterone receptor positive.  (4) status post left lumpectomy 02/12/2021 showing a 0.2 cm focus of ductal carcinoma in situ, intermediate to high-grade, with negative margins.  (5) opted against adjuvant radiation   --------------------------------------------------------------------------------------------------------------------------------- Current treatment: anastrozole  started 04/04/2021 discontinued because of toxicities   Breast cancer surveillance: mammogram 01/19/2024: Benign breast density category B   Assessment & Plan Venous thromboembolism of lower extremity First occurrence with no family history or clear precipitating factors. Differential diagnosis includes antiphospholipid antibody syndrome. - Order antiphospholipid antibody test. - Continue Eliquis. - If antiphospholipid antibody test is positive, repeat test off Eliquis in three months. - If repeat test is positive, continue lifelong anticoagulation. - If repeat test is negative, continue anticoagulation for six months.  Abdominal pain Abdominal CT scan recommended to rule out underlying causes of venous thromboembolism. - Order abdominal CT scan in one week.  Intraductal carcinoma in situ of left breast Previously treated with clear margins. No current evidence of recurrence or metastasis on recent CT scan.  Headache,  unspecified Intermittent headaches since mid-September, not consistent with migraine or tension-type headaches. CT scan was normal. Prednisone  was ineffective.      Orders Placed This Encounter  Procedures   CT ABDOMEN PELVIS W CONTRAST    No oral contrast    Standing Status:   Future    Expected Date:   03/14/2024    Expiration Date:   03/07/2025    If indicated for the ordered procedure, I authorize the administration of contrast media per Radiology protocol:  Yes    Does the patient have a contrast media/X-ray dye allergy?:   No    Preferred imaging location?:   [facility]    Release to patient:   Immediate [1]    If indicated for the ordered procedure, I authorize the administration of oral contrast media per Radiology protocol:   Yes   Lupus anticoagulant panel    Standing Status:   Future    Number of Occurrences:   1    Expiration Date:   04/11/2025   Cardiolipin antibodies, IgG, IgM, IgA    Standing Status:   Future    Number of Occurrences:   1    Expiration Date:   03/07/2025   Beta-2-glycoprotein i abs, IgG/M/A    Standing Status:   Future    Number of Occurrences:   1    Expiration Date:   03/07/2025   The patient has a good understanding of the overall plan. she agrees with it. she will call with any problems that may develop before the next visit here.  I personally spent a total of 30 minutes in the care of the patient today including preparing to see the patient, getting/reviewing separately obtained history, performing a medically appropriate exam/evaluation, counseling and educating, placing orders, referring and communicating with other health care professionals, documenting clinical information in the EHR, independently interpreting results, communicating results, and coordinating care.   Viinay K Ashni Lonzo, MD 03/07/24

## 2024-03-07 NOTE — Assessment & Plan Note (Signed)
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  (4) status post left lumpectomy 02/12/2021 showing a 0.2 cm focus of ductal carcinoma in situ, intermediate to high-grade, with negative margins.  (5) opted against adjuvant radiation   --------------------------------------------------------------------------------------------------------------------------------- Current treatment: anastrozole  started 04/04/2021 discontinued because of toxicities   Breast cancer surveillance: 1.  Breast exam 03/07/2024: Benign 2. mammogram 01/19/2024: Benign breast density category B   Return to clinic in 1 year for follow-up

## 2024-03-08 ENCOUNTER — Ambulatory Visit (HOSPITAL_BASED_OUTPATIENT_CLINIC_OR_DEPARTMENT_OTHER)
Admission: RE | Admit: 2024-03-08 | Discharge: 2024-03-08 | Disposition: A | Discharge status: Home / Self Care | Source: Ambulatory Visit | Attending: Hematology and Oncology | Admitting: Hematology and Oncology

## 2024-03-08 DIAGNOSIS — K5730 Diverticulosis of large intestine without perforation or abscess without bleeding: Secondary | ICD-10-CM | POA: Diagnosis not present

## 2024-03-08 DIAGNOSIS — R109 Unspecified abdominal pain: Secondary | ICD-10-CM | POA: Insufficient documentation

## 2024-03-08 MED ORDER — IOHEXOL 300 MG/ML  SOLN
100.0000 mL | Freq: Once | INTRAMUSCULAR | Status: AC | PRN
  Administered 2024-03-08: 100 mL via INTRAVENOUS

## 2024-03-09 LAB — BETA-2-GLYCOPROTEIN I ABS, IGG/M/A
Beta-2 Glyco I IgG: <9 GPI IgG units (ref 0–20)
Beta-2-Glycoprotein I IgA: <9 GPI IgA units (ref 0–25)
Beta-2-Glycoprotein I IgM: 49 GPI IgM units — ABNORMAL HIGH (ref 0–32)

## 2024-03-09 LAB — LUPUS ANTICOAGULANT PANEL
DRVVT: 87.6 s — ABNORMAL HIGH (ref 0.0–47.0)
PTT Lupus Anticoagulant: 36.8 s (ref 0.0–43.5)

## 2024-03-09 LAB — DRVVT MIX: dRVVT Mix: 65.1 s — ABNORMAL HIGH (ref 0.0–40.4)

## 2024-03-09 LAB — DRVVT CONFIRM: dRVVT Confirm: 1.1 ratio (ref 0.8–1.2)

## 2024-03-14 LAB — CARDIOLIPIN ANTIBODIES, IGG, IGM, IGA
Anticardiolipin IgA: <9 U/mL (ref 0–11)
Anticardiolipin IgG: <9 GPL U/mL (ref 0–14)
Anticardiolipin IgM: 19 [MPL'U]/mL — ABNORMAL HIGH (ref 0–12)

## 2024-03-21 ENCOUNTER — Inpatient Hospital Stay: Admitting: Hematology and Oncology

## 2024-03-21 DIAGNOSIS — I824Z1 Acute embolism and thrombosis of unspecified deep veins of right distal lower extremity: Secondary | ICD-10-CM | POA: Diagnosis not present

## 2024-03-21 DIAGNOSIS — D0512 Intraductal carcinoma in situ of left breast: Secondary | ICD-10-CM | POA: Diagnosis not present

## 2024-03-21 NOTE — Progress Notes (Signed)
 HEMATOLOGY-ONCOLOGY TELEPHONE VISIT PROGRESS NOTE  I connected with our patient on 03/21/24 at 11:30 AM EST by telephone and verified that I am speaking with the correct person using two identifiers.  I discussed the limitations, risks, security and privacy concerns of performing an evaluation and management service by telephone and the availability of in person appointments.  I also discussed with the patient that there may be a patient responsible charge related to this service. The patient expressed understanding and agreed to proceed.   History of Present Illness: Follow-up to discuss the result of lupus anticoagulant workup  History of Present Illness Meghan [name] is a 78 year old female who presents for evaluation of lupus anticoagulant antibody associated with increased risk of blood clots.  Blood work revealed the presence of an antibody associated with increased risk of blood clots in two out of three tests. She is currently on Eliquis, which she will hold for one day prior to a repeat blood test to avoid interference with the results. She will resume Eliquis after the test.    Oncology History  Ductal carcinoma in situ (DCIS) of left breast  10/08/2004 Surgery   s/p left lumpectomy 10/08/2004 for a pT1b pN0, stage IA invasive ductal carcinoma, grade 2, estrogen and progesterone receptor positive, HER2 not amplified, with an MIB-1 of 17%             (A) status post adjuvant MammoSite radiation             (B) status post tamoxifen for 5 years   12/22/2014 Genetic Testing   Genes tested: ATM, BARD1, BRCA1, BRCA2, BRIP1, CDH1, CHEK2, FANCC, MLH1, MSH2, MSH6, NBN, PALB2, PMS2, PTEN, RAD51C, RAD51D, TP53, and XRCC2.  .  Genetic testing was normal, and did not reveal a deleterious mutation in these genes.  Additionally, no variants of uncertain significance (VUSs) were found   01/14/2021 Initial Biopsy   Diagnosis Breast, left, needle core biopsy, upper outer, X clip - DUCTAL CARCINOMA  IN SITU, INTERMEDIATE GRADE WITH CALCIFICATIONS. SEE NOTE Diagnosis Note DCIS measures 0.4 cm in greatest linear dimension  PROGNOSTIC INDICATORS Results: Estrogen Receptor: 100%, POSITIVE, STRONG STAINING INTENSITY Progesterone Receptor: 30%, POSITIVE, STRONG STAINING INTENSITY   01/23/2021 Cancer Staging   Staging form: Breast, AJCC 8th Edition - Clinical stage from 01/23/2021: Stage 0 (cTis (DCIS), cN0, cM0, ER+, PR+, HER2: Not Assessed) - Signed by Layla Sandria BROCKS, MD on 01/23/2021 Stage prefix: Initial diagnosis Nuclear grade: G2 Laterality: Left Staged by: Pathologist and managing physician Stage used in treatment planning: Yes National guidelines used in treatment planning: Yes Type of national guideline used in treatment planning: NCCN   02/12/2021 Cancer Staging   Staging form: Breast, AJCC 8th Edition - Pathologic stage from 02/12/2021: Stage 0 (pTis (DCIS), pN0, cM0, ER+, PR+) - Signed by Crawford Morna Pickle, NP on 05/25/2021 Stage prefix: Initial diagnosis   02/12/2021 Definitive Surgery   FINAL MICROSCOPIC DIAGNOSIS:   A. BREAST, LEFT, LUMPECTOMY:  - Focus of intermediate to high-grade ductal carcinoma in situ  - Negative for invasive carcinoma  - Resection margins are negative for DCIS  - Biopsy site changes  - See oncology table   B. BREAST, LEFT ADDITIONAL SUPERIOR MARGIN, EXCISION:  - Benign breast parenchyma with no specific histopathologic changes.  - Negative for carcinoma   C. BREAST, LEFT ADDITIONAL MEDIAL MARGIN, EXCISION:  - Benign breast parenchyma with no specific histopathologic changes.  - Negative for carcinoma   D. BREAST, LEFT ADDITIONAL INFERIOR MARGIN,  EXCISION:  - Benign breast parenchyma with no specific histopathologic changes.  - Negative for carcinoma   E. BREAST, LEFT ADDITIONAL LATERAL MARGIN, EXCISION:  - Benign breast parenchyma with no specific histopathologic changes.  - Negative for carcinoma   F. BREAST, LEFT  ADDITIONAL POSTERIOR MARGIN, EXCISION:  - Benign breast parenchyma with no specific histopathologic changes.  - Negative for carcinoma    COMMENT:  A.  Focus of DCIS measures 0.2 cm in greatest dimension.    04/2021 - 04/2021 Anti-estrogen oral therapy   anastrozole  daily-unable to tolerate, caused dizziness     REVIEW OF SYSTEMS:   Constitutional: Denies fevers, chills or abnormal weight loss All other systems were reviewed with the patient and are negative. Observations/Objective:     Assessment Plan:  Ductal carcinoma in situ (DCIS) of left breast (1) s/p left lumpectomy 10/08/2004 for a pT1b pN0, stage IA invasive ductal carcinoma, grade 2, estrogen and progesterone receptor positive, HER2 not amplified, with an MIB-1 of 17%             (A) status post adjuvant MammoSite radiation             (B) status post tamoxifen for 5 years  (2) genetics testing 2006, updated 2018: No deleterious mutations  (3) status post left breast biopsy 01/14/2021 for ductal carcinoma in situ, grade 2, estrogen and progesterone receptor positive.  (4) status post left lumpectomy 02/12/2021 showing a 0.2 cm focus of ductal carcinoma in situ, intermediate to high-grade, with negative margins.  (5) opted against adjuvant radiation   --------------------------------------------------------------------------------------------------------------------------------- Current treatment: anastrozole  started 04/04/2021 discontinued because of toxicities   Breast cancer surveillance: mammogram 01/19/2024: Benign breast density category B Breast exam done 17 2025: Benign  Acute deep vein thrombosis (DVT) (HCC) 02/18/2024: Ultrasound: Right lower extremity DVT thrombus involving the right common femoral vein, right femoral vein, right popliteal vein and right calf veins  Current treatment: Eliquis started 03/01/2024 Hypercoagulability workup: Beta-2 glycoprotein 1 IgM positive (49) Anticardiolipin IgM: Positive  (19: Indeterminate) The lupus anticoagulant panel: Revealed no lupus anticoagulant.  I discussed with the patient that this could be a false positive reading.  We would like to repeat the same testing in 3 months and at that time she will discontinue Eliquis for 48 hours before running the test. --------------------------------- Assessment and Plan Assessment & Plan Evaluation for antiphospholipid antibody syndrome Mixed antibody test results; potential false positive due to Eliquis. Confirmation needed for long-term anticoagulation decision. - Repeat antibody test in three months. - Hold Eliquis one day prior to test. - Continue Eliquis post-test until results available.  Venous thromboembolism of right distal lower extremity On Apixaban for anticoagulation. Continued therapy necessary if antiphospholipid syndrome confirmed. - Continue Apixaban 5 MG oral bid, except one day prior to antibody test.      I discussed the assessment and treatment plan with the patient. The patient was provided an opportunity to ask questions and all were answered. The patient agreed with the plan and demonstrated an understanding of the instructions. The patient was advised to call back or seek an in-person evaluation if the symptoms worsen or if the condition fails to improve as anticipated.   I provided 20 minutes of non-face-to-face time during this encounter.  This includes time for charting and coordination of care   Naomi MARLA Chad, MD

## 2024-03-21 NOTE — Assessment & Plan Note (Signed)
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  (4) status post left lumpectomy 02/12/2021 showing a 0.2 cm focus of ductal carcinoma in situ, intermediate to high-grade, with negative margins.  (5) opted against adjuvant radiation   --------------------------------------------------------------------------------------------------------------------------------- Current treatment: anastrozole  started 04/04/2021 discontinued because of toxicities   Breast cancer surveillance: mammogram 01/19/2024: Benign breast density category B Breast exam done 17 2025: Benign

## 2024-03-21 NOTE — Assessment & Plan Note (Signed)
 02/18/2024: Ultrasound: Right lower extremity DVT thrombus involving the right common femoral vein, right femoral vein, right popliteal vein and right calf veins  Current treatment: Eliquis started 03/01/2024 Hypercoagulability workup: Beta-2 glycoprotein 1 IgM positive (49) Anticardiolipin IgM: Positive (19: Indeterminate) The lupus anticoagulant panel: Revealed no lupus anticoagulant.  I discussed with the patient that this could be a false positive reading.  We would like to repeat the same testing in 3 months and at that time she will discontinue Eliquis for 48 hours before running the test.

## 2024-04-07 ENCOUNTER — Ambulatory Visit: Admitting: Neurology

## 2024-04-18 ENCOUNTER — Ambulatory Visit: Attending: Cardiology | Admitting: Cardiology

## 2024-04-18 ENCOUNTER — Encounter: Payer: Self-pay | Admitting: Cardiology

## 2024-04-18 VITALS — BP 138/82 | HR 91 | Ht 62.0 in | Wt 184.4 lb

## 2024-04-18 DIAGNOSIS — I2699 Other pulmonary embolism without acute cor pulmonale: Secondary | ICD-10-CM

## 2024-04-18 DIAGNOSIS — E66811 Obesity, class 1: Secondary | ICD-10-CM | POA: Diagnosis present

## 2024-04-18 DIAGNOSIS — I10 Essential (primary) hypertension: Secondary | ICD-10-CM | POA: Insufficient documentation

## 2024-04-18 DIAGNOSIS — E785 Hyperlipidemia, unspecified: Secondary | ICD-10-CM | POA: Diagnosis present

## 2024-04-18 DIAGNOSIS — R0609 Other forms of dyspnea: Secondary | ICD-10-CM | POA: Diagnosis present

## 2024-04-18 DIAGNOSIS — Z6830 Body mass index (BMI) 30.0-30.9, adult: Secondary | ICD-10-CM | POA: Insufficient documentation

## 2024-04-18 NOTE — Progress Notes (Signed)
 Cardiology Office Note:    Date:  04/18/2024   ID:  Meghan [name], DOB [birth year], MRN [id number]  PCP:  Katina Pfeiffer, PA-C  Cardiologist:  Lonni LITTIE Nanas, MD  Electrophysiologist:  None   Referring MD: Katina Pfeiffer, PA-C   Chief Complaint  Patient presents with   Shortness of Breath     History of Present Illness:    Meghan [name] is a 78 y.o. female with a hx of breast cancer, esophageal stricture who presents for follow-up. She was referred by Dr. Morgan for evaluation of chest pain, initially seen on 10/13/2019. She reports that she has been having intermittent chest pain.  Reports that it occurs every day, can last up to 30 minutes.  Can occur with exertion, but she states she has not been exercising due to back pain.  States that over the last 2 weeks she has tried to walk on the treadmill 1-2 times, but does walk for less than 10 minutes.  Felt short of breath when she tried to do this.  States that she feels her shortness of breath has worsened over the last 6 weeks.  Walking up hills causes her to be short of breath.  Reports BP has been elevated up to 150s at home.  No smoking history.  Family history includes father had MI in 35s.  Lexiscan  Myoview  on 10/21/2019 showed normal perfusion, EF 68%. Echocardiogram on 11/11/2019 showed LVEF 65 to 70%, moderate LVH, grade 1 diastolic dysfunction, normal RV function, mild AI. Calcium  score on 11/11/2019 was 57 (54th percentile); also notable for small pulmonary nodules.  She was admitted 02/2024 with DVT in right leg with pulmonary embolism..  Unprovoked DVT/PE.  Echocardiogram showed EF 65 to 70%, normal RV size/systolic function, no significant valvular disease, negative bubble study.  She was started on Eliquis .  Since last clinic visit, she reports she is doing okay.  She is seeing hematology for recent DVT/PE.  Despite having some dyspnea with exertion.  She has not been exercising since her DVT.  She denies any  chest pain, lightheadedness, or syncope.  Has chronic left lower extremity swelling.  She is taking Eliquis , denies any bleeding issues.  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%   Cataract    REMOVED 05-2020 AND 06-2020 BILAT   Colon polyps    Hyperplastic    Diverticulosis of colon (without mention of hemorrhage)    Esophageal stricture    GERD (gastroesophageal reflux disease)    History of radiation therapy    History of urinary tract infection    Hypertension    CONTROLLED- OFF MEDS -   Lower back pain    Neuromuscular disorder (HCC)    peripheral nueropathy   Numbness    feet bilat at nighttime   Osteopenia    Personal history of radiation therapy 2006   Restless leg syndrome 03/27/2014   Tremor, 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   Head and neck tremor   Trimalleolar fracture of left ankle 08/24/2015   Urinary urgency     Past Surgical History:  Procedure Laterality Date   BREAST BIOPSY Left 01/2021   BREAST LUMPECTOMY Left 2006   BREAST LUMPECTOMY  Left 02/12/2021   BREAST LUMPECTOMY WITH RADIOACTIVE SEED LOCALIZATION Left 02/12/2021   Procedure: LEFT BREAST LUMPECTOMY WITH RADIOACTIVE SEED LOCALIZATION;  Surgeon: Vanderbilt Ned, MD;  Location: [facility];  Service: General;  Laterality: Left;   BREAST SURGERY     left 2006   CESAREAN SECTION     COLONOSCOPY     EYE SURGERY  2022   FRACTURE SURGERY  2017   ORIF ANKLE FRACTURE Left 08/24/2015   Procedure: OPEN REDUCTION INTERNAL FIXATION (ORIF) LEFT ANKLE FRACTURE;  Surgeon: Lonni CINDERELLA Poli, MD;  Location: [facility];  Service: Orthopedics;  Laterality: Left;  General with a popliteal block   POLYPECTOMY     HPP 2011    Current Medications: Current Meds   Medication Sig   apixaban  (ELIQUIS ) 5 MG TABS tablet Take 1 tablet (5 mg total) by mouth 2 (two) times daily.   b complex vitamins capsule Take 1 capsule by mouth daily.   Cholecalciferol  (VITAMIN D3) 2000 units TABS Take 2,000 Units by mouth daily.    clidinium-chlordiazePOXIDE  (LIBRAX ) 5-2.5 MG capsule Take 1 capsule by mouth as needed.   Coenzyme Q10 (CO Q-10) 100 MG CAPS Take 200 mg by mouth daily.   Cranberry-Vitamin C-Probiotic (AZO CRANBERRY) 250-30 MG TABS Take 1 tablet by mouth 2 (two) times daily.    fluticasone  (FLONASE ) 50 MCG/ACT nasal spray Place 1 spray into both nostrils daily as needed for allergies or rhinitis.   Glucosamine HCl (GLUCOSAMINE PO) Take by mouth 2 (two) times daily. 1500/1200 mg   Iron, Ferrous Sulfate, 325 (65 Fe) MG TABS Take 1 tablet by mouth See admin instructions. Take one tablet by mouth on Mondays and Fridays   MAGNESIUM  PO Take 240 mg by mouth daily.   Multiple Vitamin (MULTIVITAMIN) tablet Take 1 tablet by mouth every evening.    Omega-3 Fatty Acids (FISH OIL) 1200 MG CAPS Take 1,200 mg by mouth daily.    pantoprazole  (PROTONIX ) 40 MG tablet TAKE 1 TABLET DAILY   pregabalin  (LYRICA ) 50 MG capsule TAKE UP TO 2 CAPSULES AT BEDTIME FOR RESTLESS LEG SYMPTOMS   rosuvastatin  (CRESTOR ) 10 MG tablet TAKE 1 TABLET DAILY   TURMERIC PO Take 1 capsule by mouth 2 (two) times a day. 1000 mg     Allergies:   Lisinopril , Amlodipine , Anastrozole , Flagyl  [metronidazole ], Nsaids, Prednisone , and Tetracycline hcl   Social History   Socioeconomic History   Marital status: Married    Spouse name: Not on file   Number of children: 2   Years of education: AS   Highest education level: Not on file  Occupational History   Occupation: Teacher, Adult Education: [employer]   Occupation: Retired  Tobacco Use   Smoking status: Never   Smokeless tobacco: Never  Vaping Use   Vaping status: Never Used  Substance and Sexual Activity   Alcohol use: Yes    Comment: rare  occ drink wine   Drug use: No   Sexual activity: Yes  Other Topics Concern   Not on file  Social History Narrative   Lives at home w/ her husband   Right-handed   Occasionally drinks tea   Social Drivers of Health   Tobacco Use: Low Risk (04/18/2024)   Patient History    Smoking Tobacco Use: Never    Smokeless Tobacco Use: Never    Passive Exposure: Not on file  Financial Resource Strain: Low Risk (05/27/2021)   Overall Financial Resource Strain (CARDIA)    Difficulty of  Paying Living Expenses: Not very hard  Food Insecurity: No Food Insecurity (02/19/2024)   Epic    Worried About Programme Researcher, Broadcasting/film/video in the Last Year: Never true    Ran Out of Food in the Last Year: Never true  Transportation Needs: No Transportation Needs (02/19/2024)   Epic    Lack of Transportation (Medical): No    Lack of Transportation (Non-Medical): No  Physical Activity: Sufficiently Active (05/27/2021)   Exercise Vital Sign    Days of Exercise per Week: 3 days    Minutes of Exercise per Session: 90 min  Stress: Not on file  Social Connections: Socially Integrated (02/19/2024)   Social Connection and Isolation Panel    Frequency of Communication with Friends and Family: More than three times a week    Frequency of Social Gatherings with Friends and Family: Three times a week    Attends Religious Services: More than 4 times per year    Active Member of Clubs or Organizations: Yes    Attends Banker Meetings: 1 to 4 times per year    Marital Status: Married  Depression (PHQ2-9): Low Risk (05/27/2021)   Depression (PHQ2-9)    PHQ-2 Score: 0  Alcohol Screen: Low Risk (05/27/2021)   Alcohol Screen    Last Alcohol Screening Score (AUDIT): 0  Housing: Low Risk (02/19/2024)   Epic    Unable to Pay for Housing in the Last Year: No    Number of Times Moved in the Last Year: 0    Homeless in the Last Year: No  Utilities: Not At Risk (02/19/2024)   Epic    Threatened with loss of utilities: No   Health Literacy: Not on file     Family History:  family history includes Alzheimer's disease in her maternal aunt; Breast cancer in her maternal aunt and maternal aunt; Breast cancer (age of onset: 3) in her sister; Breast cancer (age of onset: 56) in her cousin; COPD in her father; Cancer in her maternal aunt, maternal aunt, and sister; Congestive Heart Failure in her maternal grandmother; Emphysema in her father; Esophageal cancer in her cousin; Heart attack in her mother; Heart disease in her father, maternal grandmother, and paternal grandmother; Obesity in her brother; Ovarian cancer in her cousin; Squamous cell carcinoma in her maternal aunt; Varicose Veins in her mother. There is no history of Neuropathy, Thyroid  disease, Colon cancer, Colon polyps, Rectal cancer, or Stomach cancer.  ROS:   Please see the history of present illness.    All other systems reviewed and are negative.  EKGs/Labs/Other Studies Reviewed:    The following studies were reviewed today:   EKG:   03/18/21: Normal sinus rhythm, rate 78, low voltage, poor R wave progression 04/15/22: Normal sinus rhythm, rate 77, poor R wave progression 04/10/23: Normal sinus rhythm, rate 76, poor R wave progression, Q waves in leads III, aVF   Recent Labs: 02/18/2024: Pro Brain Natriuretic Peptide 257.0 02/19/2024: Magnesium  1.7 02/20/2024: ALT 17; BUN 9; Creatinine, Ser 0.83; Potassium 3.5; Sodium 139 02/21/2024: Hemoglobin 14.0; Platelets 229  Recent Lipid     Component Value Date/Time   CHOL 119 01/13/2020 0812   TRIG 117 01/13/2020 0812   HDL 49 01/13/2020 0812   CHOLHDL 2.4 01/13/2020 0812   LDLCALC 49 01/13/2020 0812    Physical Exam:    VS:  BP 138/82   Pulse 91   Ht 5' 2 (1.575 m)   Wt 184 lb 6.4 oz (83.6 kg)  SpO2 96%   BMI 33.73 kg/m     Wt Readings from Last 3 Encounters:  04/18/24 184 lb 6.4 oz (83.6 kg)  03/07/24 181 lb 12.8 oz (82.5 kg)  03/01/24 181 lb 11.2 oz (82.4 kg)     GEN: in no  acute distress HEENT: Normal NECK: No JVD; No carotid bruits CARDIAC: RRR, no murmurs, rubs, gallops RESPIRATORY:  Clear to auscultation without rales, wheezing or rhonchi  ABDOMEN: Soft, non-tender, non-distended MUSCULOSKELETAL:  No edema; No deformity  SKIN: Warm and dry NEUROLOGIC:  Alert and oriented x 3 PSYCHIATRIC:  Normal affect   ASSESSMENT:    1. DOE (dyspnea on exertion)   2. Class 1 obesity without serious comorbidity with body mass index (BMI) of 30.0 to 30.9 in adult, unspecified obesity type   3. Essential hypertension   4. Hyperlipidemia, unspecified hyperlipidemia type   5. Pulmonary embolism without acute cor pulmonale, unspecified chronicity, unspecified pulmonary embolism type (HCC)       PLAN:    Chest pain/dyspnea on exertion: Chest pain is atypical in description.  Lexiscan  Myoview  on 10/21/2019 showed normal perfusion, EF 68%. Echocardiogram on 11/11/2019 showed LVEF 65 to 70%, moderate LVH, grade 1 diastolic dysfunction, normal RV function, mild AI. Calcium  score on 11/11/2019 was 57 (54th percentile).  Symptoms have improved, no further cardiac work-up recommended.  Hypertension: Previously on lisinopril  and amlodipine , but did not tolerate.  Currently off antihypertensives, and BP appears controlled.  Continue to monitor  Hyperlipidemia: LDL 65 on 01/01/2024, continue rosuvastatin  10 mg daily.    Pulmonary nodules: CT calcium  score 11/2019 showed small pulmonary nodules. Likely benign but given her breast cancer history, repeat chest CT was done on 04/05/2021 which showed unchanged small pulmonary nodules  Obesity: Body mass index is 33.73 kg/m.  Referred to pharmacy weight loss clinic to consider GLP-1  DVT/PE: admitted 02/2024 with DVT in right leg with pulmonary embolism..  Unprovoked DVT/PE.  Echocardiogram showed EF 65 to 70%, normal RV size/systolic function, no significant valvular disease, negative bubble study.  She was started on Eliquis .  Follows with  hematology  RTC in 1 year   Medication Adjustments/Labs and Tests Ordered: Current medicines are reviewed at length with the patient today.  Concerns regarding medicines are outlined above.  Orders Placed This Encounter  Procedures   AMB Referral to [facility] Pharm-D    No orders of the defined types were placed in this encounter.    Patient Instructions  Medication Instructions:  Your physician recommends that you continue on your current medications as directed. Please refer to the Current Medication list given to you today.  *If you need a refill on your cardiac medications before your next appointment, please call your pharmacy*   Follow-Up: At [facility], you and your health needs are our priority.  As part of our continuing mission to provide you with exceptional heart care, our providers are all part of one team.  This team includes your primary Cardiologist (physician) and Advanced Practice Providers or APPs (Physician Assistants and Nurse Practitioners) who all work together to provide you with the care you need, when you need it.  Your next appointment:   1 year(s)  Provider:   Lonni LITTIE Nanas, MD      Signed, Lonni LITTIE Nanas, MD  04/18/2024 5:13 PM    [facility]

## 2024-04-18 NOTE — Patient Instructions (Addendum)
 Medication Instructions:  Your physician recommends that you continue on your current medications as directed. Please refer to the Current Medication list given to you today.  *If you need a refill on your cardiac medications before your next appointment, please call your pharmacy*   Follow-Up: At [facility], you and your health needs are our priority.  As part of our continuing mission to provide you with exceptional heart care, our providers are all part of one team.  This team includes your primary Cardiologist (physician) and Advanced Practice Providers or APPs (Physician Assistants and Nurse Practitioners) who all work together to provide you with the care you need, when you need it.  Your next appointment:   1 year(s)  Provider:   Lonni LITTIE Nanas, MD

## 2024-04-26 ENCOUNTER — Ambulatory Visit: Attending: Cardiology | Admitting: Pharmacist

## 2024-04-26 DIAGNOSIS — E66811 Obesity, class 1: Secondary | ICD-10-CM

## 2024-04-26 DIAGNOSIS — Z6835 Body mass index (BMI) 35.0-35.9, adult: Secondary | ICD-10-CM | POA: Diagnosis not present

## 2024-04-26 DIAGNOSIS — E669 Obesity, unspecified: Secondary | ICD-10-CM | POA: Diagnosis present

## 2024-04-26 NOTE — Patient Instructions (Signed)

## 2024-04-26 NOTE — Progress Notes (Signed)
 Patient ID: Meghan [name]                 DOB: May 05, 1946                    MRN: [id number]     HPI: Meghan [name] is a 78 y.o. female patient referred to pharmacy clinic by Dr. Kate to initiate GLP1-RA therapy. PMH is significant for HTN, HLD, DVT/PE, CAC 57 (54th percentile), breat cancer and obesity. Most recent BMI 33 kg/m .  Baseline weight and BMI: 184/33 kg/m   Patient presents today to Pharm.D. clinic.  She has [employer] [facility] [employer] which recently changed their coverage for GLP-1.  She does not have any indication other than the weight loss therefore unsure if coverage will be approved but we will try.  She has not resumed any exercise since her DVT in October even though she was cleared by Dr. Kate to resume exercise.  I strongly encouraged her to get back into a routine.  She previously was going to [employer] a few times a week or walking at home.  She does not do any form of strength training which I strongly encouraged.   Diet:  Breakfast: yogurt greek non-fat  or PB toast, egg Lunch: left overs, PBJ sandwich (1/2) Dinner: chicken, beef (98%), 2 vegetables, salad Snack: fruit, potato chips Drink: mainly water, diet soda  Exercise: was going to [employer] a couple times a week (exercise classes), but hasn't gone since DVT/PE in Oct  Family History:  Family History  Problem Relation Age of Onset   Heart attack Mother    Varicose Veins Mother    COPD Father    Heart disease Father    Emphysema Father        smoker   Breast cancer Sister 27       mastectomy; metastasis to liver and lung   Cancer Sister    Obesity Brother    Breast cancer Maternal Aunt        dx. late 19s   Cancer Maternal Aunt    Alzheimer's disease Maternal Aunt    Breast cancer Maternal Aunt        dx. 70s   Squamous cell carcinoma Maternal Aunt        recently removed; dx. 95   Cancer Maternal Aunt    Heart disease Maternal Grandmother    Congestive Heart Failure Maternal Grandmother     Heart disease Paternal Grandmother    Breast cancer Cousin 55       genetic testing in approx 2015   Ovarian cancer Cousin        dx. 50s   Esophageal cancer Cousin        not a smoker   Neuropathy Neg Hx    Thyroid  disease Neg Hx    Colon cancer Neg Hx    Colon polyps Neg Hx    Rectal cancer Neg Hx    Stomach cancer Neg Hx     Social History: no tobacco, no ETOH since starting Eliquis   Labs: Lab Results  Component Value Date   HGBA1C 6.1 (H) 02/19/2024    Wt Readings from Last 1 Encounters:  04/18/24 184 lb 6.4 oz (83.6 kg)    BP Readings from Last 1 Encounters:  04/18/24 138/82   Pulse Readings from Last 1 Encounters:  04/18/24 91       Component Value Date/Time   CHOL 119 01/13/2020 0812   TRIG 117 01/13/2020 0812  HDL 49 01/13/2020 0812   CHOLHDL 2.4 01/13/2020 0812   LDLCALC 49 01/13/2020 0812    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%   Cataract    REMOVED 05-2020 AND 06-2020 BILAT   Colon polyps    Hyperplastic    Diverticulosis of colon (without mention of hemorrhage)    Esophageal stricture    GERD (gastroesophageal reflux disease)    History of radiation therapy    History of urinary tract infection    Hypertension    CONTROLLED- OFF MEDS -   Lower back pain    Neuromuscular disorder (HCC)    peripheral nueropathy   Numbness    feet bilat at nighttime   Osteopenia    Personal history of radiation therapy 2006   Restless leg syndrome 03/27/2014   Tremor, 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   Head and neck tremor   Trimalleolar fracture of left ankle 08/24/2015   Urinary urgency     Medications Ordered Prior to Encounter[1]  Allergies[2]   Assessment/Plan:  1. Weight loss - Patient has not met goal of at least 5% of body weight loss with comprehensive lifestyle modifications alone in the past 3-6 months. Pharmacotherapy is appropriate to pursue  as augmentation. Will start Zepbound 2.5 mg weekly. Confirmed patient not pregnant and no personal or family history of medullary thyroid  carcinoma (MTC) or Multiple Endocrine Neoplasia syndrome type 2 (MEN 2).  No history of pancreatitis or gallstones injection technique reviewed at today's visit.  Advised patient on common side effects including nausea, diarrhea, dyspepsia, decreased appetite, and fatigue. Counseled patient on reducing meal size and how to titrate medication to minimize side effects. Counseled patient to call if intolerable side effects or if experiencing dehydration, abdominal pain, or dizziness. Along with pharmacotherapy, the patient will follow dietary modifications and aim for at least 150 minutes of moderate-intensity exercise per week, plus resistance training twice a week (as recommended by the American Heart Association). This resistance training--such as weightlifting, bodyweight exercises, or using resistance bands, adapted to the patients ability--will help prevent muscle loss.  Follow up in 1-2 days regarding coverage of Zepbound. If therapy is initiated, phone follow-ups will be conducted every 4 weeks for dose titration until the patient reaches the effective therapeutic dose and target weight.  Biviana Saddler D Cadden Elizondo, Pharm.JONETTA SARAN, CPP [facility] [address], [place], [place] [address]  Phone: [phone]; Fax: [phone]       [1]  Current Outpatient Medications on File Prior to Visit  Medication Sig Dispense Refill   apixaban  (ELIQUIS ) 5 MG TABS tablet Take 1 tablet (5 mg total) by mouth 2 (two) times daily. 180 tablet 1   b complex vitamins capsule Take 1 capsule by mouth daily.     Cholecalciferol  (VITAMIN D3) 2000 units TABS Take 2,000 Units by mouth daily.      clidinium-chlordiazePOXIDE  (LIBRAX ) 5-2.5 MG capsule Take 1 capsule by mouth as needed. 30 capsule 3   Coenzyme Q10 (CO Q-10) 100 MG CAPS  Take 200 mg by mouth daily.     Cranberry-Vitamin C-Probiotic (AZO CRANBERRY) 250-30 MG TABS Take 1 tablet by mouth 2 (two) times daily.      fluticasone  (FLONASE ) 50 MCG/ACT nasal spray Place 1 spray into both nostrils daily as needed for allergies or rhinitis.     Glucosamine HCl (  GLUCOSAMINE PO) Take by mouth 2 (two) times daily. 1500/1200 mg     Iron, Ferrous Sulfate, 325 (65 Fe) MG TABS Take 1 tablet by mouth See admin instructions. Take one tablet by mouth on Mondays and Fridays     MAGNESIUM  PO Take 240 mg by mouth daily.     Multiple Vitamin (MULTIVITAMIN) tablet Take 1 tablet by mouth every evening.      Omega-3 Fatty Acids (FISH OIL) 1200 MG CAPS Take 1,200 mg by mouth daily.      pantoprazole  (PROTONIX ) 40 MG tablet TAKE 1 TABLET DAILY 90 tablet 3   pregabalin  (LYRICA ) 50 MG capsule TAKE UP TO 2 CAPSULES AT BEDTIME FOR RESTLESS LEG SYMPTOMS 180 capsule 1   rosuvastatin  (CRESTOR ) 10 MG tablet TAKE 1 TABLET DAILY 90 tablet 3   TURMERIC PO Take 1 capsule by mouth 2 (two) times a day. 1000 mg     No current facility-administered medications on file prior to visit.  [2]  Allergies Allergen Reactions   Lisinopril  Swelling    SOB & swelling    Amlodipine  Swelling    Swelling face   Anastrozole  Hives and Other (See Comments)   Flagyl  [Metronidazole ] Nausea Only   Nsaids Other (See Comments)    CKD - per PCP  Other reaction(s): Other (See Comments)  CKD per PCP  Other Reaction(s): Not available   Prednisone  Nausea And Vomiting   Tetracycline Hcl Other (See Comments)

## 2024-04-27 ENCOUNTER — Telehealth: Payer: Self-pay | Admitting: Pharmacy Technician

## 2024-04-27 ENCOUNTER — Other Ambulatory Visit (HOSPITAL_COMMUNITY): Payer: Self-pay

## 2024-04-27 ENCOUNTER — Encounter: Payer: Self-pay | Admitting: Pharmacist

## 2024-04-27 NOTE — Telephone Encounter (Signed)
 Pharmacy Patient Advocate Encounter   Received notification from Physician's Office that prior authorization for wegovy 0.25 MG is required/requested.   Insurance verification completed.   The patient is insured through [employer].   Per test claim: Per test claim, medication is not covered due to plan/benefit exclusion, PA not submitted at this time

## 2024-04-27 NOTE — Telephone Encounter (Signed)
 Pharmacy Patient Advocate Encounter   Received notification from Physician's Office that prior authorization for zepbound is required/requested.   Insurance verification completed.   The patient is insured through [employer].   Per test claim: Per test claim, medication is not covered due to plan/benefit exclusion, PA not submitted at this time

## 2024-05-04 MED ORDER — ZEPBOUND 2.5 MG/0.5ML ~~LOC~~ SOLN: 2.5000 mg | SUBCUTANEOUS | 0 refills | Status: DC

## 2024-05-23 MED ORDER — ZEPBOUND 2.5 MG/0.5ML ~~LOC~~ SOLN: 2.5000 mg | SUBCUTANEOUS | 0 refills | Status: AC

## 2024-05-23 MED ORDER — ZEPBOUND 5 MG/0.5ML ~~LOC~~ SOLN: 5.0000 mg | SUBCUTANEOUS | 0 refills | Status: DC

## 2024-05-23 NOTE — Addendum Note (Signed)
 Addended by: Tyja Gortney D on: 05/23/2024 01:01 PM   Modules accepted: Orders

## 2024-05-23 NOTE — Addendum Note (Signed)
 Addended by: Luva Metzger D on: 05/23/2024 07:31 AM   Modules accepted: Orders

## 2024-06-08 ENCOUNTER — Telehealth: Payer: Self-pay | Admitting: Hematology and Oncology

## 2024-06-08 NOTE — Telephone Encounter (Signed)
 I spoke with patient and she is aware of rescheduled telephone appointment with MD from 06/29/2024 to 07/06/2024.

## 2024-06-22 ENCOUNTER — Inpatient Hospital Stay: Attending: Adult Health

## 2024-06-29 ENCOUNTER — Inpatient Hospital Stay: Admitting: Hematology and Oncology

## 2024-07-06 ENCOUNTER — Inpatient Hospital Stay: Attending: Adult Health | Admitting: Hematology and Oncology

## 2024-07-20 ENCOUNTER — Ambulatory Visit: Admitting: Internal Medicine

## 2024-12-05 ENCOUNTER — Encounter: Admitting: Adult Health

## 2024-12-09 ENCOUNTER — Encounter: Admitting: Adult Health

## 2025-02-14 ENCOUNTER — Ambulatory Visit: Admitting: Neurology
